# Patient Record
Sex: Female | Born: 1935 | Race: White | Hispanic: No | Smoking: Never smoker
Health system: Southern US, Community
[De-identification: ages and names within clinical notes are randomized; demographics above are authoritative.]

## PROBLEM LIST (undated history)

## (undated) DIAGNOSIS — B172 Acute hepatitis E: Secondary | ICD-10-CM

## (undated) DIAGNOSIS — M1990 Unspecified osteoarthritis, unspecified site: Secondary | ICD-10-CM

## (undated) DIAGNOSIS — M8580 Other specified disorders of bone density and structure, unspecified site: Secondary | ICD-10-CM

## (undated) DIAGNOSIS — E559 Vitamin D deficiency, unspecified: Secondary | ICD-10-CM

## (undated) DIAGNOSIS — G43909 Migraine, unspecified, not intractable, without status migrainosus: Secondary | ICD-10-CM

## (undated) DIAGNOSIS — K759 Inflammatory liver disease, unspecified: Secondary | ICD-10-CM

## (undated) DIAGNOSIS — F0390 Unspecified dementia without behavioral disturbance: Secondary | ICD-10-CM

## (undated) HISTORY — DX: Inflammatory liver disease, unspecified: K75.9

## (undated) HISTORY — DX: Migraine, unspecified, not intractable, without status migrainosus: G43.909

## (undated) HISTORY — DX: Unspecified osteoarthritis, unspecified site: M19.90

## (undated) HISTORY — DX: Acute hepatitis E: B17.2

---

## 1963-04-01 DIAGNOSIS — B172 Acute hepatitis E: Secondary | ICD-10-CM

## 1963-04-01 DIAGNOSIS — B199 Unspecified viral hepatitis without hepatic coma: Secondary | ICD-10-CM

## 1963-04-01 HISTORY — DX: Acute hepatitis E: B17.2

## 1963-04-01 HISTORY — DX: Unspecified viral hepatitis without hepatic coma: B19.9

## 1984-03-31 HISTORY — PX: ABDOMINAL HYSTERECTOMY: SHX81

## 2001-03-31 HISTORY — PX: KNEE ARTHROSCOPY: SUR90

## 2005-09-04 ENCOUNTER — Ambulatory Visit: Payer: Self-pay | Admitting: Unknown Physician Specialty

## 2005-10-16 ENCOUNTER — Ambulatory Visit: Payer: Self-pay | Admitting: Unknown Physician Specialty

## 2005-10-23 ENCOUNTER — Ambulatory Visit: Payer: Self-pay | Admitting: Unknown Physician Specialty

## 2011-07-01 DIAGNOSIS — M79609 Pain in unspecified limb: Secondary | ICD-10-CM | POA: Diagnosis not present

## 2011-07-01 DIAGNOSIS — B351 Tinea unguium: Secondary | ICD-10-CM | POA: Diagnosis not present

## 2011-09-03 DIAGNOSIS — M1710 Unilateral primary osteoarthritis, unspecified knee: Secondary | ICD-10-CM | POA: Diagnosis not present

## 2011-12-15 DIAGNOSIS — B351 Tinea unguium: Secondary | ICD-10-CM | POA: Diagnosis not present

## 2011-12-15 DIAGNOSIS — M79609 Pain in unspecified limb: Secondary | ICD-10-CM | POA: Diagnosis not present

## 2012-05-04 ENCOUNTER — Ambulatory Visit (INDEPENDENT_AMBULATORY_CARE_PROVIDER_SITE_OTHER): Payer: Medicare Other | Admitting: Internal Medicine

## 2012-05-04 ENCOUNTER — Encounter: Payer: Self-pay | Admitting: Internal Medicine

## 2012-05-04 VITALS — BP 140/82 | HR 54 | Temp 97.9°F | Resp 16 | Ht 60.0 in | Wt 136.0 lb

## 2012-05-04 DIAGNOSIS — E559 Vitamin D deficiency, unspecified: Secondary | ICD-10-CM | POA: Diagnosis not present

## 2012-05-04 DIAGNOSIS — F32A Depression, unspecified: Secondary | ICD-10-CM | POA: Insufficient documentation

## 2012-05-04 DIAGNOSIS — E538 Deficiency of other specified B group vitamins: Secondary | ICD-10-CM | POA: Diagnosis not present

## 2012-05-04 DIAGNOSIS — Z13220 Encounter for screening for lipoid disorders: Secondary | ICD-10-CM

## 2012-05-04 DIAGNOSIS — R5383 Other fatigue: Secondary | ICD-10-CM

## 2012-05-04 DIAGNOSIS — E785 Hyperlipidemia, unspecified: Secondary | ICD-10-CM | POA: Diagnosis not present

## 2012-05-04 DIAGNOSIS — R5381 Other malaise: Secondary | ICD-10-CM | POA: Diagnosis not present

## 2012-05-04 DIAGNOSIS — B172 Acute hepatitis E: Secondary | ICD-10-CM | POA: Insufficient documentation

## 2012-05-04 DIAGNOSIS — F329 Major depressive disorder, single episode, unspecified: Secondary | ICD-10-CM

## 2012-05-04 LAB — COMPREHENSIVE METABOLIC PANEL
ALT: 18 U/L (ref 0–35)
AST: 27 U/L (ref 0–37)
Chloride: 107 mEq/L (ref 96–112)
Creatinine, Ser: 0.9 mg/dL (ref 0.4–1.2)
Sodium: 142 mEq/L (ref 135–145)
Total Bilirubin: 0.8 mg/dL (ref 0.3–1.2)
Total Protein: 7 g/dL (ref 6.0–8.3)

## 2012-05-04 LAB — LIPID PANEL
Cholesterol: 176 mg/dL (ref 0–200)
LDL Cholesterol: 101 mg/dL — ABNORMAL HIGH (ref 0–99)
Triglycerides: 95 mg/dL (ref 0.0–149.0)
VLDL: 19 mg/dL (ref 0.0–40.0)

## 2012-05-04 LAB — CBC WITH DIFFERENTIAL/PLATELET
Basophils Absolute: 0.1 10*3/uL (ref 0.0–0.1)
Eosinophils Absolute: 0 10*3/uL (ref 0.0–0.7)
HCT: 40.3 % (ref 36.0–46.0)
Lymphs Abs: 1.6 10*3/uL (ref 0.7–4.0)
MCHC: 33.8 g/dL (ref 30.0–36.0)
MCV: 93.5 fl (ref 78.0–100.0)
Monocytes Absolute: 0.5 10*3/uL (ref 0.1–1.0)
Platelets: 187 10*3/uL (ref 150.0–400.0)
RDW: 14.1 % (ref 11.5–14.6)

## 2012-05-04 NOTE — Patient Instructions (Addendum)
We are checking some bloodwork today to make sure your thyroid and kidneys are functioning well   I would like to treat you for depression once I see your lab results.  I recommend trying a protein shake in the morning instead of skipping breakfast.:  Boost, Ensure,  Glucerna (less sugar).  Or [employer] Drink (try them all)

## 2012-05-04 NOTE — Assessment & Plan Note (Signed)
With a largely positive screen and increased forgetfulness. Screenings labs to be done, followed by trial of remeron ,

## 2012-05-04 NOTE — Progress Notes (Signed)
Patient ID: Catherine Meyers, female   DOB: [birth year], 79 y.o.   MRN: [id number] Patient Active Problem List  Diagnosis  . Hepatitis, water-borne  . Depression    Subjective:  CC:   Chief Complaint  Patient presents with  . Establish Care    HPI:   Catherine Meyers is a 79 y.o. female who presents as a new patient to establish primary care .  He is brought in by son and dtr in law Cindy Prashad.  She has had no medical care in 10 yrs.  FAmily has become concerned about her poor appetite, lack of follow up., doesn't cook much family sends over food often .  Skips breakfast and lunch frequently.  Sometimes cooks eggs.   Possibly a 40 lb wt loss.  Cared for dying husband for a year,  5 yrs ago he died.  Much of the weight came off during that period. Depression screen is positive.     Past Medical History  Diagnosis Date  . Arthritis   . Migraine   . Hepatitis   . Hepatitis, water-borne 1965    Past Surgical History  Procedure Date  . Abdominal hysterectomy 1986  . Knee arthroscopy 2003    Family History  Problem Relation Age of Onset  . Cancer Sister     breast    History   Social History  . Marital Status: Widowed    Spouse Name: N/A    Number of Children: N/A  . Years of Education: N/A   Occupational History  . Not on file.   Social History Main Topics  . Smoking status: Never Smoker   . Smokeless tobacco: Not on file  . Alcohol Use: No  . Drug Use: No  . Sexually Active: No   Other Topics Concern  . Not on file   Social History Narrative  . No narrative on file    No Known Allergies   Review of Systems:   Patient denies headache, fevers, malaise, unintentional weight loss, skin rash, eye pain, sinus congestion and sinus pain, sore throat, dysphagia,  hemoptysis , cough, dyspnea, wheezing, chest pain, palpitations, orthopnea, edema, abdominal pain, nausea, melena, diarrhea, constipation, flank pain, dysuria, hematuria, urinary  Frequency, nocturia, numbness,  tingling, seizures,  Focal weakness, Loss of consciousness,  Tremor, insomnia, depression, anxiety, and suicidal ideation.     Objective:  BP 140/82  Pulse 54  Temp 97.9 F (36.6 C) (Oral)  Resp 16  Ht 5' (1.524 m)  Wt 136 lb (61.689 kg)  BMI 26.56 kg/m2  SpO2 96%  General appearance: alert, cooperative and appears stated age Ears: normal TM's and external ear canals both ears Throat: lips, mucosa, and tongue normal; teeth and gums normal Neck: no adenopathy, no carotid bruit, supple, symmetrical, trachea midline and thyroid not enlarged, symmetric, no tenderness/mass/nodules Back: symmetric, no curvature. ROM normal. No CVA tenderness. Lungs: clear to auscultation bilaterally Heart: regular rate and rhythm, S1, S2 normal, no murmur, click, rub or gallop Abdomen: soft, non-tender; bowel sounds normal; no masses,  no organomegaly Pulses: 2+ and symmetric Skin: Skin color, texture, turgor normal. No rashes or lesions Lymph nodes: Cervical, supraclavicular, and axillary nodes normal.  Assessment and Plan:  Depression With a largely positive screen and increased forgetfulness. Screenings labs to be done, followed by trial of remeron ,    Updated Medication List No outpatient encounter prescriptions on file as of 05/04/2012.     Orders Placed This Encounter  Procedures  . Comprehensive metabolic panel  .  TSH  . Vitamin D 25 hydroxy  . Lipid panel  . CBC with Differential  . Vitamin B12  . Folate RBC  . CBC with Differential    Return in about 3 weeks (around 05/25/2012).

## 2012-05-05 DIAGNOSIS — E559 Vitamin D deficiency, unspecified: Secondary | ICD-10-CM | POA: Insufficient documentation

## 2012-05-05 MED ORDER — ERGOCALCIFEROL 1.25 MG (50000 UT) PO CAPS: 50000.0000 [IU] | ORAL_CAPSULE | ORAL | Status: DC

## 2012-05-05 MED ORDER — MIRTAZAPINE 7.5 MG PO TABS: 7.5000 mg | ORAL_TABLET | Freq: Every day | ORAL | Status: DC

## 2012-05-05 NOTE — Addendum Note (Signed)
Addended by: Sherlene Shams on: 05/05/2012 01:13 PM   Modules accepted: Orders

## 2012-05-15 ENCOUNTER — Other Ambulatory Visit: Payer: Self-pay

## 2012-05-28 ENCOUNTER — Encounter: Payer: Self-pay | Admitting: Internal Medicine

## 2012-05-28 ENCOUNTER — Ambulatory Visit (INDEPENDENT_AMBULATORY_CARE_PROVIDER_SITE_OTHER): Payer: Medicare Other | Admitting: Internal Medicine

## 2012-05-28 VITALS — BP 140/78 | HR 56 | Temp 97.6°F | Resp 16 | Wt 139.2 lb

## 2012-05-28 DIAGNOSIS — F0390 Unspecified dementia without behavioral disturbance: Secondary | ICD-10-CM | POA: Diagnosis not present

## 2012-05-28 DIAGNOSIS — F068 Other specified mental disorders due to known physiological condition: Secondary | ICD-10-CM | POA: Diagnosis not present

## 2012-05-28 MED ORDER — ALPRAZOLAM 0.25 MG PO TABS: 0.2500 mg | ORAL_TABLET | Freq: Every evening | ORAL | Status: DC | PRN

## 2012-05-28 NOTE — Progress Notes (Signed)
Patient ID: Catherine Meyers, female   DOB: [birth year], 79 y.o.   MRN: [id number]   Patient Active Problem List  Diagnosis  . Hepatitis, water-borne  . Depression  . Vitamin D deficiency  . Dementia arising in the senium and presenium    Subjective:  CC:   Chief Complaint  Patient presents with  . Follow-up    HPI:   Catherine Meyers a 79 y.o. female who presents for one month follow up on dementia diagnosed last month at her first visit. She was noted to have a low vitamin D but otherwise normal labs with regard to thyroid,  Cholesterol,  renal and liver function. Her son believes that her cognitive deficits or increasing.  There have been no behavioral disturbances. She was prescribed Remeron for depression but has not been taking the Remeron on a regular basis because she does not remember to and he has been unable to call her a regular basis to remind her. She has lost 2 more kilograms that she forgets to eat.   Past Medical History  Diagnosis Date  . Arthritis   . Migraine   . Hepatitis   . Hepatitis, water-borne 1965    Past Surgical History  Procedure Laterality Date  . Abdominal hysterectomy  1986  . Knee arthroscopy  2003       The following portions of the patient's history were reviewed and updated as appropriate: Allergies, current medications, and problem list.    Review of Systems:   Patient denies headache, fevers, malaise, unintentional weight loss, skin rash, eye pain, sinus congestion and sinus pain, sore throat, dysphagia,  hemoptysis , cough, dyspnea, wheezing, chest pain, palpitations, orthopnea, edema, abdominal pain, nausea, melena, diarrhea, constipation, flank pain, dysuria, hematuria, urinary  Frequency, nocturia, numbness, tingling, seizures,  Focal weakness, Loss of consciousness,  Tremor, insomnia, depression, anxiety, and suicidal ideation.     History   Social History  . Marital Status: Widowed    Spouse Name: N/A    Number of Children: N/A   . Years of Education: N/A   Occupational History  . Not on file.   Social History Main Topics  . Smoking status: Never Smoker   . Smokeless tobacco: Not on file  . Alcohol Use: No  . Drug Use: No  . Sexually Active: No   Other Topics Concern  . Not on file   Social History Narrative  . No narrative on file    Objective:  BP 140/78  Pulse 56  Temp(Src) 97.6 F (36.4 C) (Oral)  Resp 16  Wt 139 lb 4 oz (63.163 kg)  BMI 27.2 kg/m2  SpO2 97%  General appearance: alert, cooperative and appears stated age Ears: normal TM's and external ear canals both ears Throat: lips, mucosa, and tongue normal; teeth and gums normal Neck: no adenopathy, no carotid bruit, supple, symmetrical, trachea midline and thyroid not enlarged, symmetric, no tenderness/mass/nodules Back: symmetric, no curvature. ROM normal. No CVA tenderness. Lungs: clear to auscultation bilaterally Heart: regular rate and rhythm, S1, S2 normal, no murmur, click, rub or gallop Abdomen: soft, non-tender; bowel sounds normal; no masses,  no organomegaly Pulses: 2+ and symmetric Skin: Skin color, texture, turgor normal. No rashes or lesions Lymph nodes: Cervical, supraclavicular, and axillary nodes normal.  Assessment and Plan:  Dementia arising in the senium and presenium She was screened for hypothyroidism and B12 deficiencies. She does have concurrent depression due to the loss of her husband several years ago. Remeron has been started but not  taken consistently. Will refer her for MRI of the brain and start Aricept if MRI the brain suggests that the etiology of her dementia is Alzheimer's.   Updated Medication List Outpatient Encounter Prescriptions as of 05/28/2012  Medication Sig Dispense Refill  . ergocalciferol (DRISDOL) 50000 UNITS capsule Take 1 capsule (50,000 Units total) by mouth once a week.  12 capsule  1  . MELOXICAM PO Take 1 tablet by mouth as needed (pain).      Marland Kitchen ALPRAZolam (XANAX) 0.25 MG tablet  Take 1 tablet (0.25 mg total) by mouth at bedtime as needed for sleep or anxiety.  10 tablet  0  . mirtazapine (REMERON) 7.5 MG tablet Take 1 tablet (7.5 mg total) by mouth at bedtime.  30 tablet  1   No facility-administered encounter medications on file as of 05/28/2012.     Orders Placed This Encounter  Procedures  . MR Brain Wo Contrast    Return in about 4 weeks (around 06/25/2012).

## 2012-05-28 NOTE — Patient Instructions (Addendum)
I am recommending the MRI of your brain to make sure you haven't had an silent strokes that have affected your memory  I recommend trying the medication for nerves once before the MRI to see how it makes you feel.  If you tolerate it,  Take one before you leave home for the MRI and taek one with you in case you feel very nervous in the machine  I recommend that you wear  A Life Alert necklace every day to keep you safe

## 2012-05-30 DIAGNOSIS — F329 Major depressive disorder, single episode, unspecified: Secondary | ICD-10-CM | POA: Insufficient documentation

## 2012-05-30 NOTE — Assessment & Plan Note (Signed)
She was screened for hypothyroidism and B12 deficiencies. She does have concurrent depression due to the loss of her husband several years ago. Remeron has been started but not taken consistently. Will refer her for MRI of the brain and start Aricept if MRI the brain suggests that the etiology of her dementia is Alzheimer's.

## 2012-06-11 ENCOUNTER — Telehealth: Payer: Self-pay | Admitting: Internal Medicine

## 2012-06-11 DIAGNOSIS — Z87898 Personal history of other specified conditions: Secondary | ICD-10-CM

## 2012-06-11 NOTE — Telephone Encounter (Signed)
Catherine Meyers was called and she advised that her husband was just going to take pt into the ED to be evaluated.

## 2012-06-11 NOTE — Telephone Encounter (Signed)
Patient Information:  Caller Name: Daughter in Guelda Batson  Phone: [phone]  Patient: Catherine Meyers, Catherine Meyers  Gender: Female  DOB: [birth year]  Age: 79 Years  PCP: Duncan Dull (Adults only)  Office Follow Up:  Does the office need to follow up with this patient?: No  Instructions For The Office: N/A  RN Note:  They are currently not with patient. Advised to call us once they arrive to home and check patient. Reviewed Emergent s/sx and advised ED/911 if needed. Understanding expressed.  Symptoms  Reason For Call & Symptoms: Daughter in law is caller. She states her patient has been vomiting . onset yesterday 06/10/12 at noon.  The patient lives by herself and they are not with her. Her son is on the way to her house now.  Today, they spoke with her and she is confused . She is talking about deceased relatives.  Reviewed Health History In EMR: N/A  Reviewed Medications In EMR: N/A  Reviewed Allergies In EMR: N/A  Reviewed Surgeries / Procedures: N/A  Date of Onset of Symptoms: 06/10/2012  Guideline(s) Used:  Confusion - Delirium  Disposition Per Guideline:   Go to ED Now  Reason For Disposition Reached:   Headache or vomiting  Advice Given:  Call Back If:  You (i.e., patient, family member) become worse.  Patient Will Follow Care Advice:  YES

## 2012-06-19 ENCOUNTER — Ambulatory Visit: Payer: Self-pay | Admitting: Internal Medicine

## 2012-06-19 DIAGNOSIS — I6789 Other cerebrovascular disease: Secondary | ICD-10-CM | POA: Diagnosis not present

## 2012-06-19 DIAGNOSIS — G319 Degenerative disease of nervous system, unspecified: Secondary | ICD-10-CM | POA: Diagnosis not present

## 2012-06-19 DIAGNOSIS — F0280 Dementia in other diseases classified elsewhere without behavioral disturbance: Secondary | ICD-10-CM | POA: Diagnosis not present

## 2012-06-19 DIAGNOSIS — F0390 Unspecified dementia without behavioral disturbance: Secondary | ICD-10-CM | POA: Diagnosis not present

## 2012-07-06 ENCOUNTER — Encounter: Payer: Self-pay | Admitting: Internal Medicine

## 2012-07-13 ENCOUNTER — Encounter: Payer: Self-pay | Admitting: Internal Medicine

## 2012-11-03 ENCOUNTER — Other Ambulatory Visit: Payer: Self-pay

## 2012-11-26 DIAGNOSIS — B351 Tinea unguium: Secondary | ICD-10-CM | POA: Diagnosis not present

## 2012-11-26 DIAGNOSIS — M79609 Pain in unspecified limb: Secondary | ICD-10-CM | POA: Diagnosis not present

## 2013-02-03 ENCOUNTER — Other Ambulatory Visit: Payer: Self-pay

## 2013-04-15 ENCOUNTER — Emergency Department: Payer: Self-pay | Admitting: Emergency Medicine

## 2013-04-15 DIAGNOSIS — R059 Cough, unspecified: Secondary | ICD-10-CM | POA: Diagnosis not present

## 2013-04-15 DIAGNOSIS — E869 Volume depletion, unspecified: Secondary | ICD-10-CM | POA: Diagnosis not present

## 2013-04-15 DIAGNOSIS — R5383 Other fatigue: Secondary | ICD-10-CM | POA: Diagnosis not present

## 2013-04-15 DIAGNOSIS — R05 Cough: Secondary | ICD-10-CM | POA: Diagnosis not present

## 2013-04-15 DIAGNOSIS — R5381 Other malaise: Secondary | ICD-10-CM | POA: Diagnosis not present

## 2013-04-15 DIAGNOSIS — B9789 Other viral agents as the cause of diseases classified elsewhere: Secondary | ICD-10-CM | POA: Diagnosis not present

## 2013-04-15 DIAGNOSIS — Z79899 Other long term (current) drug therapy: Secondary | ICD-10-CM | POA: Diagnosis not present

## 2013-04-15 DIAGNOSIS — J111 Influenza due to unidentified influenza virus with other respiratory manifestations: Secondary | ICD-10-CM | POA: Diagnosis not present

## 2013-04-15 LAB — CBC
HCT: 42 % (ref 35.0–47.0)
HGB: 14.5 g/dL (ref 12.0–16.0)
MCH: 31.3 pg (ref 26.0–34.0)
MCHC: 34.4 g/dL (ref 32.0–36.0)
MCV: 91 fL (ref 80–100)
Platelet: 169 10*3/uL (ref 150–440)
RBC: 4.62 10*6/uL (ref 3.80–5.20)
RDW: 13.4 % (ref 11.5–14.5)
WBC: 6.5 10*3/uL (ref 3.6–11.0)

## 2013-04-15 LAB — COMPREHENSIVE METABOLIC PANEL
ALBUMIN: 3.6 g/dL (ref 3.4–5.0)
ALK PHOS: 76 U/L
ALT: 20 U/L (ref 12–78)
ANION GAP: 4 — AB (ref 7–16)
BUN: 17 mg/dL (ref 7–18)
Bilirubin,Total: 0.3 mg/dL (ref 0.2–1.0)
CHLORIDE: 102 mmol/L (ref 98–107)
Calcium, Total: 8.6 mg/dL (ref 8.5–10.1)
Co2: 30 mmol/L (ref 21–32)
Creatinine: 1.01 mg/dL (ref 0.60–1.30)
EGFR (Non-African Amer.): 51 — ABNORMAL LOW
GFR CALC AF AMER: 60 — AB
Glucose: 90 mg/dL (ref 65–99)
Osmolality: 273 (ref 275–301)
POTASSIUM: 3.6 mmol/L (ref 3.5–5.1)
SGOT(AST): 24 U/L (ref 15–37)
SODIUM: 136 mmol/L (ref 136–145)
TOTAL PROTEIN: 6.9 g/dL (ref 6.4–8.2)

## 2013-04-15 LAB — URINALYSIS, COMPLETE
BILIRUBIN, UR: NEGATIVE
Bacteria: NONE SEEN
Blood: NEGATIVE
Glucose,UR: NEGATIVE mg/dL (ref 0–75)
KETONE: NEGATIVE
Leukocyte Esterase: NEGATIVE
NITRITE: NEGATIVE
PH: 5 (ref 4.5–8.0)
RBC,UR: 1 /HPF (ref 0–5)
SPECIFIC GRAVITY: 1.027 (ref 1.003–1.030)
Squamous Epithelial: NONE SEEN
WBC UR: <1 /HPF (ref 0–5)

## 2013-04-15 LAB — TROPONIN I

## 2013-04-15 LAB — CK TOTAL AND CKMB (NOT AT ARMC)
CK, TOTAL: 63 U/L (ref 21–215)
CK-MB: 0.7 ng/mL (ref 0.5–3.6)

## 2013-04-22 ENCOUNTER — Ambulatory Visit (INDEPENDENT_AMBULATORY_CARE_PROVIDER_SITE_OTHER): Payer: Medicare Other | Admitting: Internal Medicine

## 2013-04-22 ENCOUNTER — Encounter: Payer: Self-pay | Admitting: Internal Medicine

## 2013-04-22 VITALS — BP 118/68 | HR 87 | Temp 98.2°F | Wt 132.0 lb

## 2013-04-22 DIAGNOSIS — J209 Acute bronchitis, unspecified: Secondary | ICD-10-CM | POA: Diagnosis not present

## 2013-04-22 MED ORDER — AZITHROMYCIN 250 MG PO TABS: ORAL_TABLET | ORAL | Status: DC

## 2013-04-22 MED ORDER — DEXTROMETHORPHAN-GUAIFENESIN 20-200 MG/5ML PO LIQD: 5.0000 mL | Freq: Three times a day (TID) | ORAL | Status: DC | PRN

## 2013-04-22 NOTE — Progress Notes (Signed)
Pre-visit discussion using our clinic review tool. No additional management support is needed unless otherwise documented below in the visit note.  

## 2013-04-22 NOTE — Progress Notes (Signed)
HPI  Pt presents to the clinic today with c/o cough and chest congestion. This started about 2 weeks ago. [facility]. Her chest xray was negative for pneumonia. They gave her Tessalon Pearls. Her cough has not improved. She denies fever chills or body aches.  Review of Systems      Past Medical History  Diagnosis Date  . Arthritis   . Migraine   . Hepatitis   . Hepatitis, water-borne 1965    Family History  Problem Relation Age of Onset  . Cancer Sister     breast    History   Social History  . Marital Status: Widowed    Spouse Name: N/A    Number of Children: N/A  . Years of Education: N/A   Occupational History  . Not on file.   Social History Main Topics  . Smoking status: Never Smoker   . Smokeless tobacco: Not on file  . Alcohol Use: No  . Drug Use: No  . Sexual Activity: No   Other Topics Concern  . Not on file   Social History Narrative  . No narrative on file    No Known Allergies   Constitutional:  Denies headache, fatigue, fever or  abrupt weight changes.  HEENT:  Denies eye redness, eye pain, pressure behind the eyes, facial pain, nasal congestion, ear pain, ringing in the ears, wax buildup, runny nose or bloody nose. Respiratory: Positive cough and chest congestion. Denies difficulty breathing or shortness of breath.  Cardiovascular: Denies chest pain, chest tightness, palpitations or swelling in the hands or feet.   No other specific complaints in a complete review of systems (except as listed in HPI above).  Objective:   BP 118/68  Pulse 87  Temp(Src) 98.2 F (36.8 C) (Oral)  Wt 132 lb (59.875 kg)  SpO2 96% Wt Readings from Last 3 Encounters:  04/22/13 132 lb (59.875 kg)  05/28/12 139 lb 4 oz (63.163 kg)  05/04/12 136 lb (61.689 kg)     General: Appears her stated age, chronically ill appearing in NAD. HEENT: Head: normal shape and size; Eyes: sclera white, no icterus, conjunctiva pink, PERRLA and EOMs  intact; Ears: Tm's gray and intact, normal light reflex; Nose: mucosa pink and moist, septum midline; Throat/Mouth. Teeth present, mucosa erythematous and moist, no exudate noted, no lesions or ulcerations noted.  Neck: Mild cervical lymphadenopathy. Neck supple, trachea midline. No massses, lumps or thyromegaly present.  Cardiovascular: Normal rate and rhythm. S1,S2 noted.  No murmur, rubs or gallops noted. No JVD or BLE edema. No carotid bruits noted. Pulmonary/Chest: Normal effort and positive vesicular breath sounds. No respiratory distress. No wheezes, rales or ronchi noted.      Assessment & Plan:   Acute Bronchitis:  Get some rest and drink plenty of water eRx for Azithromax x 5 days eRx for dextromethorphan with guaifenesin cough syrup  RTC as needed or if symptoms persist.

## 2013-04-22 NOTE — Patient Instructions (Signed)

## 2013-05-10 DIAGNOSIS — M79609 Pain in unspecified limb: Secondary | ICD-10-CM | POA: Diagnosis not present

## 2013-05-10 DIAGNOSIS — B351 Tinea unguium: Secondary | ICD-10-CM | POA: Diagnosis not present

## 2013-05-16 ENCOUNTER — Encounter: Payer: Medicare Other | Admitting: Internal Medicine

## 2013-05-31 ENCOUNTER — Ambulatory Visit (INDEPENDENT_AMBULATORY_CARE_PROVIDER_SITE_OTHER): Payer: Medicare Other | Admitting: Internal Medicine

## 2013-05-31 ENCOUNTER — Encounter: Payer: Self-pay | Admitting: Internal Medicine

## 2013-05-31 VITALS — BP 132/66 | HR 68 | Temp 97.4°F | Resp 16 | Ht 59.5 in | Wt 130.5 lb

## 2013-05-31 DIAGNOSIS — R5381 Other malaise: Secondary | ICD-10-CM

## 2013-05-31 DIAGNOSIS — F32A Depression, unspecified: Secondary | ICD-10-CM

## 2013-05-31 DIAGNOSIS — F3289 Other specified depressive episodes: Secondary | ICD-10-CM | POA: Diagnosis not present

## 2013-05-31 DIAGNOSIS — Z0000 Encounter for general adult medical examination without abnormal findings: Secondary | ICD-10-CM | POA: Diagnosis not present

## 2013-05-31 DIAGNOSIS — K5900 Constipation, unspecified: Secondary | ICD-10-CM

## 2013-05-31 DIAGNOSIS — F068 Other specified mental disorders due to known physiological condition: Secondary | ICD-10-CM

## 2013-05-31 DIAGNOSIS — R5383 Other fatigue: Secondary | ICD-10-CM | POA: Diagnosis not present

## 2013-05-31 DIAGNOSIS — F329 Major depressive disorder, single episode, unspecified: Secondary | ICD-10-CM | POA: Diagnosis not present

## 2013-05-31 DIAGNOSIS — E559 Vitamin D deficiency, unspecified: Secondary | ICD-10-CM

## 2013-05-31 DIAGNOSIS — F0390 Unspecified dementia without behavioral disturbance: Secondary | ICD-10-CM

## 2013-05-31 MED ORDER — POLYETHYLENE GLYCOL 3350 17 GM/SCOOP PO POWD: 17.0000 g | Freq: Every day | ORAL | Status: DC

## 2013-05-31 NOTE — Progress Notes (Addendum)
Patient ID: Catherine Meyers, female   DOB: 1927/04/29, 80 y.o.   MRN: [id number]  The patient is here for annual Medicare wellness examination and management of other chronic and acute problems. nnual follow up.  She is accompanied by son Kathlene NovemberMike and Alona BeneJoyce her part time caregiver who is with her for 4 hours  4 days/week in the afternoons.  Caregiver has experience with dementia. Notes good appetite,  No agitation.  Patient stays in robe a lot.  Will not let caregiver help her bathe or shower.   has walk  in shower with a bench and a rail.   Found by word of mouth at church.  Having hard BMs twice a week.  Occasionally has a loose stool per son.  Does not like to take medication.      The risk factors are reflected in the social history.  The roster of all physicians providing medical care to patient - is listed in the Snapshot section of the chart.  Activities of daily living:  The patient is not 100% independent in all ADLs due to dementia : dressing, toileting, feeding as well as independent mobility  Home safety : The patient has smoke detectors in the home. They wear seatbelts.  There are no firearms at home. There is no violence in the home.   There is no risks for hepatitis, STDs or HIV. There is no   history of blood transfusion. They have no travel history to infectious disease endemic areas of the world.  The patient has seen their dentist in the last six month. They have seen their eye doctor in the last year. They admit to slight hearing difficulty with regard to whispered voices and some television programs.  They have deferred audiologic testing in the last year.  They do not  have excessive sun exposure. Discussed the need for sun protection: hats, long sleeves and use of sunscreen if there is significant sun exposure.   Diet: the importance of a healthy diet is discussed. They do have a healthy diet.  The benefits of regular aerobic exercise were discussed. She does not exercise and stays  indoors all of the tie .   Depression screen: there are no signs or vegative symptoms of depression- irritability, change in appetite, anhedonia, sadness/tearfullness, but patient does suffer from dementia   Cognitive assessment: the patient does not manage all their financial and personal affairs and is not actively engaged. They could not relate day,date,year and events.   The following portions of the patient's history were reviewed and updated as appropriate: allergies, current medications, past family history, past medical history,  past surgical history, past social history  and problem list.  Visual acuity was not assessed per patient preference since she has regular follow up with her ophthalmologist. Hearing and body mass index were assessed and reviewed.   During the course of the visit the patient was educated and counseled about appropriate screening and preventive services including : fall prevention , diabetes screening, nutrition counseling, colorectal cancer screening, and recommended immunizations.    Objective:  General appearance: alert, cooperative and appears stated age Ears: normal TM's and external ear canals both ears Throat: lips, mucosa, and tongue normal; teeth and gums normal Neck: no adenopathy, no carotid bruit, supple, symmetrical, trachea midline and thyroid not enlarged, symmetric, no tenderness/mass/nodules Back: symmetric, no curvature. ROM normal. No CVA tenderness. Lungs: clear to auscultation bilaterally Heart: regular rate and rhythm, S1, S2 normal, no murmur, click, rub or gallop Abdomen:  soft, non-tender; bowel sounds normal; no masses,  no organomegaly Pulses: 2+ and symmetric Skin: Skin color, texture, turgor normal. No rashes or lesions Sacrum:  No skin breakdown or inguinal rash. Lymph nodes: Cervical, supraclavicular, and axillary nodes normal.  Assessment and Plan:  Vitamin D deficiency Repeat level 27.  resume Drisdol x 3 month  s  Encounter for preventive health examination Discussed the utility of screening for CA with son.  Given patient's dementia and age, no continued screening for breast or colon CA is planned.   Depression Improved with remeron. No changes today  Vascular dementia with depressed mood Metabolic screening and MRI done last year confirm.  Depression has improved with remeron. She will not take aricept.   Unspecified constipation Daily miralax recommended.    Updated Medication List Outpatient Encounter Prescriptions as of 05/31/2013  Medication Sig  . meloxicam (MOBIC) 15 MG tablet Take 15 mg by mouth daily.  Marland Kitchen Dextromethorphan-Guaifenesin 20-200 MG/5ML LIQD Take 5 mLs by mouth every 8 (eight) hours as needed.  . ergocalciferol (DRISDOL) 50000 UNITS capsule Take 1 capsule (50,000 Units total) by mouth once a week.  . polyethylene glycol powder (GLYCOLAX/MIRALAX) powder Take 17 g by mouth daily.  . [DISCONTINUED] azithromycin (ZITHROMAX) 250 MG tablet Take 2 tabs today, then 1 tab daily x 4 days

## 2013-05-31 NOTE — Progress Notes (Signed)
Pre-visit discussion using our clinic review tool. No additional management support is needed unless otherwise documented below in the visit note.  

## 2013-05-31 NOTE — Patient Instructions (Signed)
Dr. [name] say you have to wear a " Life Alert" necklace ALL THE TIME if you want to continue living at home  YOU HAVE TO HAVE A [facility] EVERY Monday Wednesday AND Friday.  Catherine Meyers will help you   YOU NEED TO USE A WALKER TO PREVENT FALLS  You need to have a dose of miralax once day to help your constipation

## 2013-06-01 LAB — COMPREHENSIVE METABOLIC PANEL
ALBUMIN: 3.9 g/dL (ref 3.5–5.2)
ALK PHOS: 74 U/L (ref 39–117)
ALT: 17 U/L (ref 0–35)
AST: 24 U/L (ref 0–37)
BUN: 17 mg/dL (ref 6–23)
CALCIUM: 9.3 mg/dL (ref 8.4–10.5)
CHLORIDE: 105 meq/L (ref 96–112)
CO2: 24 mEq/L (ref 19–32)
CREATININE: 1 mg/dL (ref 0.4–1.2)
GFR: 56.88 mL/min — ABNORMAL LOW (ref >60.00)
Glucose, Bld: 64 mg/dL — ABNORMAL LOW (ref 70–99)
POTASSIUM: 4.7 meq/L (ref 3.5–5.1)
Sodium: 142 mEq/L (ref 135–145)
Total Bilirubin: 0.5 mg/dL (ref 0.3–1.2)
Total Protein: 6.8 g/dL (ref 6.0–8.3)

## 2013-06-01 LAB — VITAMIN D 25 HYDROXY (VIT D DEFICIENCY, FRACTURES): Vit D, 25-Hydroxy: 27 ng/mL — ABNORMAL LOW (ref 30–89)

## 2013-06-01 LAB — CBC WITH DIFFERENTIAL/PLATELET
BASOS ABS: 0 10*3/uL (ref 0.0–0.1)
Basophils Relative: 0.5 % (ref 0.0–3.0)
Eosinophils Absolute: 0.1 10*3/uL (ref 0.0–0.7)
Eosinophils Relative: 1.5 % (ref 0.0–5.0)
HEMATOCRIT: 41.1 % (ref 36.0–46.0)
Hemoglobin: 13.7 g/dL (ref 12.0–15.0)
LYMPHS ABS: 1.9 10*3/uL (ref 0.7–4.0)
Lymphocytes Relative: 27.3 % (ref 12.0–46.0)
MCHC: 33.4 g/dL (ref 30.0–36.0)
MCV: 95 fl (ref 78.0–100.0)
MONO ABS: 0.7 10*3/uL (ref 0.1–1.0)
Monocytes Relative: 9.7 % (ref 3.0–12.0)
Neutro Abs: 4.2 10*3/uL (ref 1.4–7.7)
Neutrophils Relative %: 61 % (ref 43.0–77.0)
PLATELETS: 261 10*3/uL (ref 150.0–400.0)
RBC: 4.32 Mil/uL (ref 3.87–5.11)
RDW: 14.8 % — ABNORMAL HIGH (ref 11.5–14.6)
WBC: 6.9 10*3/uL (ref 4.5–10.5)

## 2013-06-01 LAB — TSH: TSH: 3.17 u[IU]/mL (ref 0.35–5.50)

## 2013-06-02 ENCOUNTER — Encounter: Payer: Self-pay | Admitting: Internal Medicine

## 2013-06-02 DIAGNOSIS — K5900 Constipation, unspecified: Secondary | ICD-10-CM | POA: Insufficient documentation

## 2013-06-02 DIAGNOSIS — Z0000 Encounter for general adult medical examination without abnormal findings: Secondary | ICD-10-CM | POA: Insufficient documentation

## 2013-06-02 MED ORDER — ERGOCALCIFEROL 1.25 MG (50000 UT) PO CAPS: 50000.0000 [IU] | ORAL_CAPSULE | ORAL | Status: DC

## 2013-06-02 NOTE — Assessment & Plan Note (Signed)
Daily miralax recommended.

## 2013-06-02 NOTE — Assessment & Plan Note (Signed)
Repeat level 27.  resume Drisdol x 3 month s

## 2013-06-02 NOTE — Assessment & Plan Note (Signed)
Improved with remeron. No changes today

## 2013-06-02 NOTE — Assessment & Plan Note (Signed)
Discussed the utility of screening for CA with son.  Given patient's dementia and age, no continued screening for breast or colon CA is planned.

## 2013-06-02 NOTE — Assessment & Plan Note (Signed)
Metabolic screening and MRI done last year confirm.  Depression has improved with remeron. She will not take aricept.

## 2013-06-06 ENCOUNTER — Other Ambulatory Visit: Payer: Self-pay | Admitting: *Deleted

## 2013-06-06 DIAGNOSIS — E559 Vitamin D deficiency, unspecified: Secondary | ICD-10-CM

## 2013-06-06 MED ORDER — ERGOCALCIFEROL 1.25 MG (50000 UT) PO CAPS: 50000.0000 [IU] | ORAL_CAPSULE | ORAL | Status: DC

## 2013-10-17 ENCOUNTER — Ambulatory Visit: Payer: Medicare Other | Admitting: Internal Medicine

## 2013-10-18 ENCOUNTER — Encounter: Payer: Self-pay | Admitting: Internal Medicine

## 2013-10-18 ENCOUNTER — Telehealth: Payer: Self-pay | Admitting: *Deleted

## 2013-10-18 ENCOUNTER — Ambulatory Visit (INDEPENDENT_AMBULATORY_CARE_PROVIDER_SITE_OTHER): Payer: Medicare Other | Admitting: Internal Medicine

## 2013-10-18 VITALS — BP 136/70 | HR 56 | Temp 97.7°F | Resp 16 | Ht 59.5 in | Wt 135.2 lb

## 2013-10-18 DIAGNOSIS — F0151 Vascular dementia with behavioral disturbance: Secondary | ICD-10-CM | POA: Diagnosis not present

## 2013-10-18 DIAGNOSIS — R300 Dysuria: Secondary | ICD-10-CM

## 2013-10-18 DIAGNOSIS — F329 Major depressive disorder, single episode, unspecified: Secondary | ICD-10-CM

## 2013-10-18 DIAGNOSIS — F0153 Vascular dementia, unspecified severity, with mood disturbance: Secondary | ICD-10-CM | POA: Diagnosis not present

## 2013-10-18 LAB — POCT URINALYSIS DIPSTICK
BILIRUBIN UA: NEGATIVE
Glucose, UA: NEGATIVE
KETONES UA: NEGATIVE
Nitrite, UA: NEGATIVE
PH UA: 6
Protein, UA: NEGATIVE
Spec Grav, UA: 1.015
Urobilinogen, UA: 0.2

## 2013-10-18 MED ORDER — CRANBERRY 400 MG PO TABS: 1.0000 | ORAL_TABLET | Freq: Two times a day (BID) | ORAL | Status: DC

## 2013-10-18 NOTE — Telephone Encounter (Signed)
Bring patient in today, I will see her

## 2013-10-18 NOTE — Progress Notes (Signed)
Pre-visit discussion using our clinic review tool. No additional management support is needed unless otherwise documented below in the visit note.  

## 2013-10-18 NOTE — Patient Instructions (Signed)
Pleasde consider giving Catherine Meyers a cranberry tablet twice daily with meals to prevent UTI  We will start an antibiotic if the urine specimen results in a positive infection result

## 2013-10-18 NOTE — Telephone Encounter (Signed)
pts daughter in law called states pt has increased confusion with strong urine odor.  Concerned pt may have a UTI.  Please advise

## 2013-10-18 NOTE — Telephone Encounter (Signed)
Spoke with pts daughter in law, pt scheduled for 7.20.15 @ 3:15

## 2013-10-19 ENCOUNTER — Encounter: Payer: Self-pay | Admitting: Internal Medicine

## 2013-10-19 DIAGNOSIS — N390 Urinary tract infection, site not specified: Secondary | ICD-10-CM | POA: Insufficient documentation

## 2013-10-19 LAB — URINALYSIS, ROUTINE W REFLEX MICROSCOPIC
Bilirubin Urine: NEGATIVE
Hgb urine dipstick: NEGATIVE
KETONES UR: NEGATIVE
Nitrite: NEGATIVE
SPECIFIC GRAVITY, URINE: 1.01 (ref 1.000–1.030)
Total Protein, Urine: NEGATIVE
Urine Glucose: NEGATIVE
Urobilinogen, UA: 0.2 (ref 0.0–1.0)
pH: 6 (ref 5.0–8.0)

## 2013-10-19 MED ORDER — SULFAMETHOXAZOLE-TRIMETHOPRIM 800-160 MG PO TABS: 1.0000 | ORAL_TABLET | Freq: Two times a day (BID) | ORAL | Status: DC

## 2013-10-19 NOTE — Assessment & Plan Note (Addendum)
With pyuria on UA.  Empiric septra DS and probiotic x 7 days

## 2013-10-19 NOTE — Progress Notes (Addendum)
Patient ID: RADIAH LUBINSKI, female   DOB: Dec 15, 1927, 80 y.o.   MRN: [id number]   Patient Active Problem List   Diagnosis Date Noted  . UTI (urinary tract infection) 10/19/2013  . Encounter for preventive health examination 06/02/2013  . Unspecified constipation 06/02/2013  . Vascular dementia with depressed mood 05/30/2012  . Vitamin D deficiency 05/05/2012  . Depression 05/04/2012    Subjective:  CC:   Chief Complaint  Patient presents with  . Polyuria    10 to15 times a day, interferring with sleep.    HPI:   SONG MYRE is a 80 y.o. female who presents for follow up on dementia and other related issues.  She has been having urinary frequency, up to 15  Times daily.  Her  caregiver is unable to quantify volume bc patient will not allow anyone to help her in bathroom.  She refuses to bathe  and refuses to allow family or caregiver to help her.  She denies pain with urination, nausea and malaise,  Appetite good.  Marland Kitchen  Has been more confused lately. No behavioral issues.    Past Medical History  Diagnosis Date  . Arthritis   . Migraine   . Hepatitis   . Hepatitis, water-borne 1965    Past Surgical History  Procedure Laterality Date  . Abdominal hysterectomy  1986  . Knee arthroscopy  2003       The following portions of the patient's history were reviewed and updated as appropriate: Allergies, current medications, and problem list.    Review of Systems:   Patient denies headache, fevers, malaise, unintentional weight loss, skin rash, eye pain, sinus congestion and sinus pain, sore throat, dysphagia,  hemoptysis , cough, dyspnea, wheezing, chest pain, palpitations, orthopnea, edema, abdominal pain, nausea, melena, diarrhea, constipation, flank pain, dysuria, hematuria, urinary  Frequency, nocturia, numbness, tingling, seizures,  Focal weakness, Loss of consciousness,  Tremor, insomnia, depression, anxiety, and suicidal ideation.     History   Social History  .  Marital Status: Widowed    Spouse Name: N/A    Number of Children: N/A  . Years of Education: N/A   Occupational History  . Not on file.   Social History Main Topics  . Smoking status: Never Smoker   . Smokeless tobacco: Not on file  . Alcohol Use: No  . Drug Use: No  . Sexual Activity: No   Other Topics Concern  . Not on file   Social History Narrative  . No narrative on file    Objective:  Filed Vitals:   10/18/13 1543  BP: 136/70  Pulse: 56  Temp: 97.7 F (36.5 C)  Resp: 16     General appearance: alert, cooperative and appears stated age Ears: normal TM's and external ear canals both ears Throat: lips, mucosa, and tongue normal; teeth and gums normal Neck: no adenopathy, no carotid bruit, supple, symmetrical, trachea midline and thyroid not enlarged, symmetric, no tenderness/mass/nodules Back: symmetric, no curvature. ROM normal. No CVA tenderness. Lungs: clear to auscultation bilaterally Heart: regular rate and rhythm, S1, S2 normal, no murmur, click, rub or gallop Abdomen: soft, non-tender; bowel sounds normal; no masses,  no organomegaly Pulses: 2+ and symmetric Skin: Skin color, texture, turgor normal. No rashes or lesions Lymph nodes: Cervical, supraclavicular, and axillary nodes normal.  Assessment and Plan:  UTI (urinary tract infection) With pyuria on UA.  Empiric septra DS and probiotic x 7 days   Vascular dementia with depressed mood No behavioral issues except  she refuses to bathe.  Suggested having caregiver wear "scrubs' to look more authoritative and engender compliance.  Also recommended keeping  moist toilettes/baby wipes in her bathroom and encourage her to use them after voiding to maintain hygiene in the absence of regular bathing    A total of 25 minutes was spent with patient and patient's son and cargiver, more than half of which was spent in counseling, and coordination of care.  Updated Medication List Outpatient Encounter  Prescriptions as of 10/18/2013  Medication Sig  . ergocalciferol (DRISDOL) 50000 UNITS capsule Take 1 capsule (50,000 Units total) by mouth once a week.  . Cranberry 400 MG TABS Take 1 tablet (400 mg total) by mouth 2 (two) times daily after a meal.  . Dextromethorphan-Guaifenesin 20-200 MG/5ML LIQD Take 5 mLs by mouth every 8 (eight) hours as needed.  . meloxicam (MOBIC) 15 MG tablet Take 15 mg by mouth daily.  . polyethylene glycol powder (GLYCOLAX/MIRALAX) powder Take 17 g by mouth daily.  Marland Kitchen. sulfamethoxazole-trimethoprim (SEPTRA DS) 800-160 MG per tablet Take 1 tablet by mouth 2 (two) times daily.

## 2013-10-19 NOTE — Assessment & Plan Note (Signed)
No behavioral issues except she refuses to bathe.  Suggested having caregiver wear "scrubs' to look more authoritative and engender compliance.  Also recommended keeping  moist toilettes/baby wipes in her bathroom and encourage her to use them after voiding to maintain hygiene in the absence of regular bathing

## 2013-10-21 LAB — URINE CULTURE

## 2013-10-21 NOTE — Telephone Encounter (Signed)
Pt was notified 10/19/13, see result note

## 2013-10-24 ENCOUNTER — Emergency Department: Payer: Self-pay | Admitting: Emergency Medicine

## 2013-10-24 ENCOUNTER — Telehealth: Payer: Self-pay | Admitting: Internal Medicine

## 2013-10-24 DIAGNOSIS — S8990XA Unspecified injury of unspecified lower leg, initial encounter: Secondary | ICD-10-CM | POA: Diagnosis not present

## 2013-10-24 DIAGNOSIS — M25569 Pain in unspecified knee: Secondary | ICD-10-CM | POA: Diagnosis not present

## 2013-10-24 DIAGNOSIS — S8010XA Contusion of unspecified lower leg, initial encounter: Secondary | ICD-10-CM | POA: Diagnosis not present

## 2013-10-24 DIAGNOSIS — S79919A Unspecified injury of unspecified hip, initial encounter: Secondary | ICD-10-CM | POA: Diagnosis not present

## 2013-10-24 DIAGNOSIS — S99919A Unspecified injury of unspecified ankle, initial encounter: Secondary | ICD-10-CM | POA: Diagnosis not present

## 2013-10-24 DIAGNOSIS — S79929A Unspecified injury of unspecified thigh, initial encounter: Secondary | ICD-10-CM | POA: Diagnosis not present

## 2013-10-24 DIAGNOSIS — I10 Essential (primary) hypertension: Secondary | ICD-10-CM | POA: Diagnosis not present

## 2013-10-24 DIAGNOSIS — M25579 Pain in unspecified ankle and joints of unspecified foot: Secondary | ICD-10-CM | POA: Diagnosis not present

## 2013-10-24 DIAGNOSIS — M7989 Other specified soft tissue disorders: Secondary | ICD-10-CM | POA: Diagnosis not present

## 2013-10-24 DIAGNOSIS — T148XXA Other injury of unspecified body region, initial encounter: Secondary | ICD-10-CM | POA: Diagnosis not present

## 2013-10-24 LAB — HEPATIC FUNCTION PANEL
ALK PHOS: 84 U/L (ref 25–125)
ALT: 24 U/L (ref 7–35)
AST: 31 U/L (ref 13–35)
BILIRUBIN, TOTAL: 0.3 mg/dL

## 2013-10-24 LAB — COMPREHENSIVE METABOLIC PANEL
ALK PHOS: 84 U/L
ANION GAP: 5 — AB (ref 7–16)
Albumin: 3.5 g/dL (ref 3.4–5.0)
BUN: 23 mg/dL — ABNORMAL HIGH (ref 7–18)
Bilirubin,Total: 0.3 mg/dL (ref 0.2–1.0)
CALCIUM: 8.8 mg/dL (ref 8.5–10.1)
CHLORIDE: 104 mmol/L (ref 98–107)
CO2: 28 mmol/L (ref 21–32)
Creatinine: 1.35 mg/dL — ABNORMAL HIGH (ref 0.60–1.30)
EGFR (African American): 42 — ABNORMAL LOW
EGFR (Non-African Amer.): 36 — ABNORMAL LOW
GLUCOSE: 119 mg/dL — AB (ref 65–99)
Osmolality: 279 (ref 275–301)
Potassium: 4.2 mmol/L (ref 3.5–5.1)
SGOT(AST): 31 U/L (ref 15–37)
SGPT (ALT): 24 U/L
Sodium: 137 mmol/L (ref 136–145)
Total Protein: 7.3 g/dL (ref 6.4–8.2)

## 2013-10-24 LAB — CBC AND DIFFERENTIAL
HCT: 43 % (ref 36–46)
Hemoglobin: 13.9 g/dL (ref 12.0–16.0)
Platelets: 255 10*3/uL (ref 150–399)
WBC: 8.1 10^3/mL

## 2013-10-24 LAB — CBC
HCT: 42.7 % (ref 35.0–47.0)
HGB: 13.9 g/dL (ref 12.0–16.0)
MCH: 31 pg (ref 26.0–34.0)
MCHC: 32.5 g/dL (ref 32.0–36.0)
MCV: 95 fL (ref 80–100)
Platelet: 255 10*3/uL (ref 150–440)
RBC: 4.48 10*6/uL (ref 3.80–5.20)
RDW: 14 % (ref 11.5–14.5)
WBC: 8.1 10*3/uL (ref 3.6–11.0)

## 2013-10-24 LAB — BASIC METABOLIC PANEL
BUN: 23 mg/dL — AB (ref 4–21)
Creatinine: 1.4 mg/dL — AB (ref 0.5–1.1)
Glucose: 119 mg/dL
Potassium: 4.2 mmol/L (ref 3.4–5.3)
SODIUM: 137 mmol/L (ref 137–147)

## 2013-10-24 LAB — CK TOTAL AND CKMB (NOT AT ARMC)
CK, TOTAL: 126 U/L
CK-MB: 1.9 ng/mL (ref 0.5–3.6)

## 2013-10-24 LAB — TROPONIN I

## 2013-10-24 NOTE — Telephone Encounter (Signed)
Faxed order and office notes as requested.

## 2013-10-24 NOTE — Addendum Note (Signed)
Addended by: Sherlene ShamsULLO, Dore Oquin L on: 2020/05/1413 01:44 PM   Modules accepted: Orders

## 2013-10-24 NOTE — Telephone Encounter (Signed)
Please advise 

## 2013-10-24 NOTE — Telephone Encounter (Signed)
Order for Home Health and OV note from July 21 printed

## 2013-10-24 NOTE — Telephone Encounter (Signed)
Catherine Meyers from home health called in and stated that the son and daughter have been in the dementia consultations and wanted to know if [name] was in agreeance with home health and was needing an order and the last note from her last visit.

## 2013-10-28 ENCOUNTER — Telehealth: Payer: Self-pay | Admitting: *Deleted

## 2013-10-28 DIAGNOSIS — I69998 Other sequelae following unspecified cerebrovascular disease: Secondary | ICD-10-CM | POA: Diagnosis not present

## 2013-10-28 DIAGNOSIS — Z87440 Personal history of urinary (tract) infections: Secondary | ICD-10-CM | POA: Diagnosis not present

## 2013-10-28 DIAGNOSIS — E559 Vitamin D deficiency, unspecified: Secondary | ICD-10-CM | POA: Diagnosis not present

## 2013-10-28 DIAGNOSIS — F0151 Vascular dementia with behavioral disturbance: Secondary | ICD-10-CM | POA: Diagnosis not present

## 2013-10-28 DIAGNOSIS — Z9181 History of falling: Secondary | ICD-10-CM | POA: Diagnosis not present

## 2013-10-28 DIAGNOSIS — F0153 Vascular dementia, unspecified severity, with mood disturbance: Secondary | ICD-10-CM | POA: Diagnosis not present

## 2013-10-28 NOTE — Telephone Encounter (Signed)
Catherine Meyers called requesting to add Social Work for long term Set designerplanning and Nurse Aide for personal care and OT for dementia care and ADL's.  Please advise

## 2013-10-28 NOTE — Telephone Encounter (Signed)
Who is Gavin PoundDeborah?Family member of patient,  Or home health representative?

## 2013-10-31 NOTE — Telephone Encounter (Signed)
C[facility] Meyers is from [facility]

## 2013-10-31 NOTE — Telephone Encounter (Signed)
Ok to give verbal auth  For social work, Therapist, arturse Aide,  And OT

## 2013-10-31 NOTE — Telephone Encounter (Signed)
[name] notified as requested.

## 2013-11-01 ENCOUNTER — Ambulatory Visit (INDEPENDENT_AMBULATORY_CARE_PROVIDER_SITE_OTHER): Payer: Medicare Other | Admitting: Internal Medicine

## 2013-11-01 ENCOUNTER — Encounter: Payer: Self-pay | Admitting: Internal Medicine

## 2013-11-01 VITALS — BP 124/68 | HR 65 | Temp 97.4°F | Resp 16 | Ht 59.5 in | Wt 135.2 lb

## 2013-11-01 DIAGNOSIS — F329 Major depressive disorder, single episode, unspecified: Secondary | ICD-10-CM

## 2013-11-01 DIAGNOSIS — IMO0002 Reserved for concepts with insufficient information to code with codable children: Secondary | ICD-10-CM | POA: Diagnosis not present

## 2013-11-01 DIAGNOSIS — N9989 Other postprocedural complications and disorders of genitourinary system: Secondary | ICD-10-CM | POA: Diagnosis not present

## 2013-11-01 DIAGNOSIS — Y92009 Unspecified place in unspecified non-institutional (private) residence as the place of occurrence of the external cause: Secondary | ICD-10-CM

## 2013-11-01 DIAGNOSIS — N179 Acute kidney failure, unspecified: Secondary | ICD-10-CM | POA: Diagnosis not present

## 2013-11-01 DIAGNOSIS — F0153 Vascular dementia, unspecified severity, with mood disturbance: Secondary | ICD-10-CM

## 2013-11-01 DIAGNOSIS — W19XXXD Unspecified fall, subsequent encounter: Secondary | ICD-10-CM

## 2013-11-01 DIAGNOSIS — M899 Disorder of bone, unspecified: Secondary | ICD-10-CM

## 2013-11-01 DIAGNOSIS — W19XXXA Unspecified fall, initial encounter: Secondary | ICD-10-CM

## 2013-11-01 DIAGNOSIS — N990 Postprocedural (acute) (chronic) kidney failure: Secondary | ICD-10-CM

## 2013-11-01 DIAGNOSIS — E559 Vitamin D deficiency, unspecified: Secondary | ICD-10-CM

## 2013-11-01 DIAGNOSIS — M949 Disorder of cartilage, unspecified: Secondary | ICD-10-CM

## 2013-11-01 DIAGNOSIS — F0151 Vascular dementia with behavioral disturbance: Secondary | ICD-10-CM

## 2013-11-01 DIAGNOSIS — Z23 Encounter for immunization: Secondary | ICD-10-CM | POA: Diagnosis not present

## 2013-11-01 DIAGNOSIS — Z5189 Encounter for other specified aftercare: Secondary | ICD-10-CM

## 2013-11-01 DIAGNOSIS — M8580 Other specified disorders of bone density and structure, unspecified site: Secondary | ICD-10-CM

## 2013-11-01 DIAGNOSIS — S93409A Sprain of unspecified ligament of unspecified ankle, initial encounter: Secondary | ICD-10-CM

## 2013-11-01 MED ORDER — TETANUS-DIPHTH-ACELL PERTUSSIS 5-2.5-18.5 LF-MCG/0.5 IM SUSP: 0.5000 mL | Freq: Once | INTRAMUSCULAR | Status: DC

## 2013-11-01 MED ORDER — ZOSTER VACCINE LIVE 19400 UNT/0.65ML ~~LOC~~ SOLR: 0.6500 mL | Freq: Once | SUBCUTANEOUS | Status: DC

## 2013-11-01 NOTE — Progress Notes (Addendum)
Patient ID: Catherine RoesFay R Shad, female   DOB: 1927-10-25, 80 y.o.   MRN: [id number]  Patient Active Problem List   Diagnosis Date Noted  . Sprain of ankle, unspecified site 11/04/2013  . Fall at home 11/04/2013  . Acute renal failure 11/04/2013  . Osteopenia of the elderly 11/04/2013  . UTI (urinary tract infection) 10/19/2013  . Encounter for preventive health examination 06/02/2013  . Unspecified constipation 06/02/2013  . Vascular dementia with depressed mood 05/30/2012  . Vitamin D deficiency 05/05/2012  . Depression 05/04/2012    Subjective:  CC:   Chief Complaint  Patient presents with  . Follow-up    memory issues, fall last Monday . injured left ankle and elbow.    HPI:   Catherine Meyers is a 80 y.o. female who presents for Multiple issues , including declining functional status secondary to dementia , and recent fall.  Patient was evaluated and [facility] July [facility] for contusions and ankle sprain sustained during an unwitnessed fall at home.  Patient has no memory of event but per son, the paid caregiver reports that she was using her walker and lost her balance and fell over with the walker. Patient was complaining of left elbow and left ankle pain .  Both were evaluated with plain films and fractures were ruled out..  Ankle was wrapped in Ace bandage and family iced it twice daily fore the first 3 days. No NSAIDS were given. Patient  Has been ambulating for the last several days without complaining of pain .  Using walker.  Has been staying at son's home since the fall, and social work is assisting him in his goals of placing patient in an assisted living facility for patients with dementia. Marland Kitchen.  Hospice has been contacted and is planning to make tiwce weekly visits on Tues and Thursday for 1-2 hours to assist patient with  bathing and OT    Past Medical History  Diagnosis Date  . Arthritis   . Migraine   . Hepatitis   . Hepatitis, water-borne 1965    Past Surgical  History  Procedure Laterality Date  . Abdominal hysterectomy  1986  . Knee arthroscopy  2003       The following portions of the patient's history were reviewed and updated as appropriate: Allergies, current medications, and problem list.    Review of Systems:   Patient denies headache, fevers, malaise, unintentional weight loss, skin rash, eye pain, sinus congestion and sinus pain, sore throat, dysphagia,  hemoptysis , cough, dyspnea, wheezing, chest pain, palpitations, orthopnea, edema, abdominal pain, nausea, melena, diarrhea, constipation, flank pain, dysuria, hematuria, urinary  Frequency, nocturia, numbness, tingling, seizures,  Focal weakness, Loss of consciousness,  Tremor, insomnia, depression, anxiety, and suicidal ideation.     History   Social History  . Marital Status: Widowed    Spouse Name: N/A    Number of Children: N/A  . Years of Education: N/A   Occupational History  . Not on file.   Social History Main Topics  . Smoking status: Never Smoker   . Smokeless tobacco: Not on file  . Alcohol Use: No  . Drug Use: No  . Sexual Activity: No   Other Topics Concern  . Not on file   Social History Narrative  . No narrative on file    Objective:  Filed Vitals:   11/01/13 1305  BP: 124/68  Pulse: 65  Temp: 97.4 F (36.3 C)  Resp: 16  General appearance: alert, cooperative and appears stated age Neck: no adenopathy, no carotid bruit, supple, symmetrical, trachea midline and thyroid not enlarged, symmetric, no tenderness/mass/nodules Back: symmetric, kyphotic. No vertebral or CVA tenderness. Lungs: clear to auscultation bilaterally Heart: regular rate and rhythm, S1, S2 normal, no murmur, click, rub or gallop Abdomen: soft, non-tender; bowel sounds normal; no masses,  no organomegaly Pulses: 2+ and symmetric Skin: Skin color, texture, turgor normal. No rashes or lesions MSK: Tenderness  to palpation and manipulation over the lateral malleolus    Neuro: CNs 2-12 intact. DTRs 2+/4 in biceps, brachioradialis, patellars and achilles. Muscle strength 5/5 in upper and lower exremities. Fine resting tremor bilaterally both hands cerebellar function normal. Romberg negative.  No pronator drift.   Gait antalgic, uses a walker.    Assessment and Plan:  Vascular dementia with depressed mood Suggested by MRI done in 2014 showing severe cerebral and cerebellar atrophy and white matter changes consistent with chronic  Ischemia.  No behavioral issues except she refuses to bathe.  Her recent fall now making independent living problematic and unsafe.  Patient now living with son and will receive hospice assistance for bathing and hygiene while  son looks into options for  assisted living.    UTI (urinary tract infection) Treated, now taking daily cranberry tablets as suggested   Vitamin D deficiency Repeating level post treatment with Drisdol for level < 10  Sprain of ankle, unspecified site conitnue meloxicam,  Add tylenol prn and continue to ice when symptomatic   Fall at home Treated and released by [facility] ER July 27th .  EKD was normal except for 1st degree AV block.  Cardiac enzymes normal x 1 set.  Left hip was x rayed and no fractures were seen.  Osteopenia noted. Left ankle filsm were notable for ST swelling around the lateral mallelus. Left knee films noted a small effusion , no fractueres,  And chronic OA changes.  Cr was elevated to 1.35 and will be repeated today    Acute renal failure   Cr was 1.35 in Er on July 27 th.  myeloic was held until GFR was reassessed. Resolved by repeat assessment today.  Osteopenia of the elderly Noted on plain films of hip and ankle.  Will order DEXA scan and treat for T score < -2.0    A total of 40 minutes was spent with patient more than half of which was spent in counseling, reviewing records from other prviders and coordination of care.  Updated Medication List Outpatient Encounter  Prescriptions as of 11/01/2013  Medication Sig  . Cranberry 400 MG TABS Take 1 tablet (400 mg total) by mouth 2 (two) times daily after a meal.  . ergocalciferol (DRISDOL) 50000 UNITS capsule Take 1 capsule (50,000 Units total) by mouth once a week.  . meloxicam (MOBIC) 15 MG tablet Take 15 mg by mouth daily.  . Tdap (BOOSTRIX) 5-2.5-18.5 LF-MCG/0.5 injection Inject 0.5 mLs into the muscle once.  . zoster vaccine live, PF, (ZOSTAVAX) 78295 UNT/0.65ML injection Inject 19,400 Units into the skin once.  . [DISCONTINUED] Dextromethorphan-Guaifenesin 20-200 MG/5ML LIQD Take 5 mLs by mouth every 8 (eight) hours as needed.  . [DISCONTINUED] polyethylene glycol powder (GLYCOLAX/MIRALAX) powder Take 17 g by mouth daily.  . [DISCONTINUED] sulfamethoxazole-trimethoprim (SEPTRA DS) 800-160 MG per tablet Take 1 tablet by mouth 2 (two) times daily.     Orders Placed This Encounter  Procedures  . DG Bone Density  . Pneumococcal conjugate vaccine 13-valent  . Basic metabolic  panel  . CBC and differential  . Basic metabolic panel  . Hepatic function panel    Return in about 3 months (around 02/01/2014).

## 2013-11-01 NOTE — Patient Instructions (Signed)
Continue the daily cranberry tablets to prevent more urinary infections  We are rechecking your kidney function today  No motrin or meloxicam until you hear from me  [facility] to use tylenol 500 mg every 6 hours (2000 mg daily max) and ice as needed   Tetanus (tdap) vaccine and  Shingles vaccines are advised and are less $$$$ at a local pharmacy or Health Dept

## 2013-11-01 NOTE — Progress Notes (Signed)
Pre-visit discussion using our clinic review tool. No additional management support is needed unless otherwise documented below in the visit note.  

## 2013-11-02 ENCOUNTER — Encounter: Payer: Self-pay | Admitting: Internal Medicine

## 2013-11-02 LAB — BASIC METABOLIC PANEL
BUN: 23 mg/dL (ref 6–23)
CO2: 28 mEq/L (ref 19–32)
Calcium: 9.1 mg/dL (ref 8.4–10.5)
Chloride: 105 mEq/L (ref 96–112)
Creatinine, Ser: 0.9 mg/dL (ref 0.4–1.2)
GFR: 60.33 mL/min (ref >60.00)
Glucose, Bld: 64 mg/dL — ABNORMAL LOW (ref 70–99)
Potassium: 4.6 mEq/L (ref 3.5–5.1)
SODIUM: 137 meq/L (ref 135–145)

## 2013-11-03 DIAGNOSIS — Z87440 Personal history of urinary (tract) infections: Secondary | ICD-10-CM | POA: Diagnosis not present

## 2013-11-03 DIAGNOSIS — I69998 Other sequelae following unspecified cerebrovascular disease: Secondary | ICD-10-CM | POA: Diagnosis not present

## 2013-11-03 DIAGNOSIS — F0153 Vascular dementia, unspecified severity, with mood disturbance: Secondary | ICD-10-CM | POA: Diagnosis not present

## 2013-11-03 DIAGNOSIS — Z9181 History of falling: Secondary | ICD-10-CM | POA: Diagnosis not present

## 2013-11-03 DIAGNOSIS — E559 Vitamin D deficiency, unspecified: Secondary | ICD-10-CM | POA: Diagnosis not present

## 2013-11-03 DIAGNOSIS — F0151 Vascular dementia with behavioral disturbance: Secondary | ICD-10-CM | POA: Diagnosis not present

## 2013-11-04 ENCOUNTER — Telehealth: Payer: Self-pay | Admitting: Internal Medicine

## 2013-11-04 ENCOUNTER — Encounter: Payer: Self-pay | Admitting: Internal Medicine

## 2013-11-04 DIAGNOSIS — F0153 Vascular dementia, unspecified severity, with mood disturbance: Secondary | ICD-10-CM | POA: Diagnosis not present

## 2013-11-04 DIAGNOSIS — I69998 Other sequelae following unspecified cerebrovascular disease: Secondary | ICD-10-CM | POA: Diagnosis not present

## 2013-11-04 DIAGNOSIS — Y92009 Unspecified place in unspecified non-institutional (private) residence as the place of occurrence of the external cause: Secondary | ICD-10-CM

## 2013-11-04 DIAGNOSIS — E559 Vitamin D deficiency, unspecified: Secondary | ICD-10-CM | POA: Diagnosis not present

## 2013-11-04 DIAGNOSIS — M8580 Other specified disorders of bone density and structure, unspecified site: Secondary | ICD-10-CM | POA: Insufficient documentation

## 2013-11-04 DIAGNOSIS — S93409A Sprain of unspecified ligament of unspecified ankle, initial encounter: Secondary | ICD-10-CM | POA: Insufficient documentation

## 2013-11-04 DIAGNOSIS — Z9181 History of falling: Secondary | ICD-10-CM | POA: Diagnosis not present

## 2013-11-04 DIAGNOSIS — N179 Acute kidney failure, unspecified: Secondary | ICD-10-CM | POA: Insufficient documentation

## 2013-11-04 DIAGNOSIS — F0151 Vascular dementia with behavioral disturbance: Secondary | ICD-10-CM | POA: Diagnosis not present

## 2013-11-04 DIAGNOSIS — Z87440 Personal history of urinary (tract) infections: Secondary | ICD-10-CM | POA: Diagnosis not present

## 2013-11-04 DIAGNOSIS — W19XXXA Unspecified fall, initial encounter: Secondary | ICD-10-CM | POA: Insufficient documentation

## 2013-11-04 NOTE — Assessment & Plan Note (Signed)
Treated, now taking daily cranberry tablets as suggested

## 2013-11-04 NOTE — Assessment & Plan Note (Signed)
Cr was 1.35 in Er on July 27 th.  myeloic was held until GFR was reassessed. Resolved by repeat assessment today.

## 2013-11-04 NOTE — Assessment & Plan Note (Signed)
Repeating level post treatment with Drisdol for level < 10

## 2013-11-04 NOTE — Assessment & Plan Note (Signed)
Treated and released by [facility] ER July 27th .  EKD was normal except for 1st degree AV block.  Cardiac enzymes normal x 1 set.  Left hip was x rayed and no fractures were seen.  Osteopenia noted. Left ankle filsm were notable for ST swelling around the lateral mallelus. Left knee films noted a small effusion , no fractueres,  And chronic OA changes.  Cr was elevated to 1.35 and will be repeated today

## 2013-11-04 NOTE — Telephone Encounter (Signed)
Her x rays noted osteopenia, and she has never had a bone density test..  She is at increased risk for hip fractures, so I am recommending a bone density test so we can treat if indicated. i have ordered it to be done at [facility] please let her son know.

## 2013-11-04 NOTE — Assessment & Plan Note (Signed)
conitnue meloxicam,  Add tylenol prn and continue to ice when symptomatic

## 2013-11-04 NOTE — Telephone Encounter (Signed)
Patient son notified and voiced understanding. 

## 2013-11-04 NOTE — Assessment & Plan Note (Addendum)
Suggested by MRI done in 2014 showing severe cerebral and cerebellar atrophy and white matter changes consistent with chronic  Ischemia.  No behavioral issues except she refuses to bathe.  Her recent fall now making independent living problematic and unsafe.  Patient now living with son and will receive hospice assistance for bathing and hygiene while  son looks into options for  assisted living.

## 2013-11-04 NOTE — Assessment & Plan Note (Signed)
Noted on plain films of hip and ankle.  Will order DEXA scan and treat for T score < -2.0

## 2013-11-08 ENCOUNTER — Ambulatory Visit (INDEPENDENT_AMBULATORY_CARE_PROVIDER_SITE_OTHER): Payer: Medicare Other | Admitting: *Deleted

## 2013-11-08 DIAGNOSIS — I69998 Other sequelae following unspecified cerebrovascular disease: Secondary | ICD-10-CM | POA: Diagnosis not present

## 2013-11-08 DIAGNOSIS — F0153 Vascular dementia, unspecified severity, with mood disturbance: Secondary | ICD-10-CM | POA: Diagnosis not present

## 2013-11-08 DIAGNOSIS — F0151 Vascular dementia with behavioral disturbance: Secondary | ICD-10-CM | POA: Diagnosis not present

## 2013-11-08 DIAGNOSIS — Z0279 Encounter for issue of other medical certificate: Secondary | ICD-10-CM

## 2013-11-08 DIAGNOSIS — Z111 Encounter for screening for respiratory tuberculosis: Secondary | ICD-10-CM | POA: Diagnosis not present

## 2013-11-08 DIAGNOSIS — E559 Vitamin D deficiency, unspecified: Secondary | ICD-10-CM | POA: Diagnosis not present

## 2013-11-08 DIAGNOSIS — Z9181 History of falling: Secondary | ICD-10-CM | POA: Diagnosis not present

## 2013-11-08 DIAGNOSIS — Z87440 Personal history of urinary (tract) infections: Secondary | ICD-10-CM | POA: Diagnosis not present

## 2013-11-09 ENCOUNTER — Telehealth: Payer: Self-pay | Admitting: Internal Medicine

## 2013-11-09 NOTE — Telephone Encounter (Signed)
[facility] to be filled out on 11/07/13, paper work filled out and given back to this nurse on 11/09/13. [facility]ed to [facility] on same date.  Copy made for scan to chart and billing.

## 2013-11-10 DIAGNOSIS — I69998 Other sequelae following unspecified cerebrovascular disease: Secondary | ICD-10-CM | POA: Diagnosis not present

## 2013-11-10 DIAGNOSIS — F0151 Vascular dementia with behavioral disturbance: Secondary | ICD-10-CM | POA: Diagnosis not present

## 2013-11-10 DIAGNOSIS — F0153 Vascular dementia, unspecified severity, with mood disturbance: Secondary | ICD-10-CM | POA: Diagnosis not present

## 2013-11-10 DIAGNOSIS — Z9181 History of falling: Secondary | ICD-10-CM | POA: Diagnosis not present

## 2013-11-10 DIAGNOSIS — Z87440 Personal history of urinary (tract) infections: Secondary | ICD-10-CM | POA: Diagnosis not present

## 2013-11-10 DIAGNOSIS — E559 Vitamin D deficiency, unspecified: Secondary | ICD-10-CM | POA: Diagnosis not present

## 2013-11-10 LAB — TB SKIN TEST
INDURATION: 0 mm
TB Skin Test: NEGATIVE

## 2013-11-11 DIAGNOSIS — Z0279 Encounter for issue of other medical certificate: Secondary | ICD-10-CM

## 2013-11-15 ENCOUNTER — Telehealth: Payer: Self-pay | Admitting: Internal Medicine

## 2013-11-15 NOTE — Telephone Encounter (Signed)
[facility] called requested written order for Vitals signs once per week and for perimeters, please advise perimeters you would like to use I will type up and fax to [phone]

## 2013-11-15 NOTE — Telephone Encounter (Signed)
Notified facility no need for vitals no HX of hypertension.

## 2013-11-15 NOTE — Telephone Encounter (Signed)
She doesn't need vital signs once a week., she has no history of hypertension

## 2013-11-16 ENCOUNTER — Telehealth: Payer: Self-pay | Admitting: Internal Medicine

## 2013-11-16 NOTE — Telephone Encounter (Signed)
[facility] states that DX of vascular dementia with depression needs more documentation to support such HX of CVA or need a more specific DX of dementia. Faxed to [phone]

## 2013-11-17 NOTE — Telephone Encounter (Signed)
Notes faxed.

## 2013-11-17 NOTE — Telephone Encounter (Signed)
Last OV note has been amended  And printed please fax to number below

## 2013-11-18 DIAGNOSIS — Z9181 History of falling: Secondary | ICD-10-CM | POA: Diagnosis not present

## 2013-11-18 DIAGNOSIS — Z87440 Personal history of urinary (tract) infections: Secondary | ICD-10-CM | POA: Diagnosis not present

## 2013-11-18 DIAGNOSIS — F0151 Vascular dementia with behavioral disturbance: Secondary | ICD-10-CM | POA: Diagnosis not present

## 2013-11-18 DIAGNOSIS — I69998 Other sequelae following unspecified cerebrovascular disease: Secondary | ICD-10-CM | POA: Diagnosis not present

## 2013-11-18 DIAGNOSIS — F0153 Vascular dementia, unspecified severity, with mood disturbance: Secondary | ICD-10-CM | POA: Diagnosis not present

## 2013-11-18 DIAGNOSIS — E559 Vitamin D deficiency, unspecified: Secondary | ICD-10-CM | POA: Diagnosis not present

## 2013-11-22 DIAGNOSIS — M949 Disorder of cartilage, unspecified: Secondary | ICD-10-CM | POA: Diagnosis not present

## 2013-11-22 DIAGNOSIS — R269 Unspecified abnormalities of gait and mobility: Secondary | ICD-10-CM | POA: Diagnosis not present

## 2013-11-22 DIAGNOSIS — M899 Disorder of bone, unspecified: Secondary | ICD-10-CM | POA: Diagnosis not present

## 2013-11-22 DIAGNOSIS — F0151 Vascular dementia with behavioral disturbance: Secondary | ICD-10-CM | POA: Diagnosis not present

## 2013-11-22 DIAGNOSIS — F0153 Vascular dementia, unspecified severity, with mood disturbance: Secondary | ICD-10-CM | POA: Diagnosis not present

## 2013-11-22 DIAGNOSIS — Z87440 Personal history of urinary (tract) infections: Secondary | ICD-10-CM | POA: Diagnosis not present

## 2013-11-22 DIAGNOSIS — Z9181 History of falling: Secondary | ICD-10-CM | POA: Diagnosis not present

## 2013-11-22 DIAGNOSIS — M129 Arthropathy, unspecified: Secondary | ICD-10-CM | POA: Diagnosis not present

## 2013-11-22 DIAGNOSIS — S93409A Sprain of unspecified ligament of unspecified ankle, initial encounter: Secondary | ICD-10-CM | POA: Diagnosis not present

## 2013-11-24 DIAGNOSIS — F0153 Vascular dementia, unspecified severity, with mood disturbance: Secondary | ICD-10-CM | POA: Diagnosis not present

## 2013-11-24 DIAGNOSIS — M129 Arthropathy, unspecified: Secondary | ICD-10-CM | POA: Diagnosis not present

## 2013-11-24 DIAGNOSIS — F0151 Vascular dementia with behavioral disturbance: Secondary | ICD-10-CM | POA: Diagnosis not present

## 2013-11-24 DIAGNOSIS — Z9181 History of falling: Secondary | ICD-10-CM | POA: Diagnosis not present

## 2013-11-24 DIAGNOSIS — M899 Disorder of bone, unspecified: Secondary | ICD-10-CM | POA: Diagnosis not present

## 2013-11-24 DIAGNOSIS — S93409A Sprain of unspecified ligament of unspecified ankle, initial encounter: Secondary | ICD-10-CM | POA: Diagnosis not present

## 2013-11-24 DIAGNOSIS — R269 Unspecified abnormalities of gait and mobility: Secondary | ICD-10-CM | POA: Diagnosis not present

## 2013-11-29 DIAGNOSIS — M949 Disorder of cartilage, unspecified: Secondary | ICD-10-CM | POA: Diagnosis not present

## 2013-11-29 DIAGNOSIS — R269 Unspecified abnormalities of gait and mobility: Secondary | ICD-10-CM | POA: Diagnosis not present

## 2013-11-29 DIAGNOSIS — M899 Disorder of bone, unspecified: Secondary | ICD-10-CM | POA: Diagnosis not present

## 2013-11-29 DIAGNOSIS — F0151 Vascular dementia with behavioral disturbance: Secondary | ICD-10-CM | POA: Diagnosis not present

## 2013-11-29 DIAGNOSIS — S93409A Sprain of unspecified ligament of unspecified ankle, initial encounter: Secondary | ICD-10-CM | POA: Diagnosis not present

## 2013-11-29 DIAGNOSIS — M129 Arthropathy, unspecified: Secondary | ICD-10-CM | POA: Diagnosis not present

## 2013-11-29 DIAGNOSIS — Z9181 History of falling: Secondary | ICD-10-CM | POA: Diagnosis not present

## 2013-11-29 DIAGNOSIS — F0153 Vascular dementia, unspecified severity, with mood disturbance: Secondary | ICD-10-CM | POA: Diagnosis not present

## 2013-12-01 DIAGNOSIS — M129 Arthropathy, unspecified: Secondary | ICD-10-CM | POA: Diagnosis not present

## 2013-12-01 DIAGNOSIS — S93409A Sprain of unspecified ligament of unspecified ankle, initial encounter: Secondary | ICD-10-CM | POA: Diagnosis not present

## 2013-12-01 DIAGNOSIS — F0153 Vascular dementia, unspecified severity, with mood disturbance: Secondary | ICD-10-CM | POA: Diagnosis not present

## 2013-12-01 DIAGNOSIS — M949 Disorder of cartilage, unspecified: Secondary | ICD-10-CM | POA: Diagnosis not present

## 2013-12-01 DIAGNOSIS — Z9181 History of falling: Secondary | ICD-10-CM | POA: Diagnosis not present

## 2013-12-01 DIAGNOSIS — M899 Disorder of bone, unspecified: Secondary | ICD-10-CM | POA: Diagnosis not present

## 2013-12-01 DIAGNOSIS — R269 Unspecified abnormalities of gait and mobility: Secondary | ICD-10-CM | POA: Diagnosis not present

## 2013-12-01 DIAGNOSIS — F0151 Vascular dementia with behavioral disturbance: Secondary | ICD-10-CM | POA: Diagnosis not present

## 2013-12-05 DIAGNOSIS — F0151 Vascular dementia with behavioral disturbance: Secondary | ICD-10-CM | POA: Diagnosis not present

## 2013-12-05 DIAGNOSIS — Z9181 History of falling: Secondary | ICD-10-CM | POA: Diagnosis not present

## 2013-12-05 DIAGNOSIS — F0153 Vascular dementia, unspecified severity, with mood disturbance: Secondary | ICD-10-CM | POA: Diagnosis not present

## 2013-12-05 DIAGNOSIS — M899 Disorder of bone, unspecified: Secondary | ICD-10-CM | POA: Diagnosis not present

## 2013-12-05 DIAGNOSIS — S93409A Sprain of unspecified ligament of unspecified ankle, initial encounter: Secondary | ICD-10-CM | POA: Diagnosis not present

## 2013-12-05 DIAGNOSIS — R269 Unspecified abnormalities of gait and mobility: Secondary | ICD-10-CM | POA: Diagnosis not present

## 2013-12-05 DIAGNOSIS — M129 Arthropathy, unspecified: Secondary | ICD-10-CM | POA: Diagnosis not present

## 2013-12-06 DIAGNOSIS — M899 Disorder of bone, unspecified: Secondary | ICD-10-CM | POA: Diagnosis not present

## 2013-12-06 DIAGNOSIS — Z9181 History of falling: Secondary | ICD-10-CM | POA: Diagnosis not present

## 2013-12-06 DIAGNOSIS — M129 Arthropathy, unspecified: Secondary | ICD-10-CM | POA: Diagnosis not present

## 2013-12-06 DIAGNOSIS — F0153 Vascular dementia, unspecified severity, with mood disturbance: Secondary | ICD-10-CM | POA: Diagnosis not present

## 2013-12-06 DIAGNOSIS — F0151 Vascular dementia with behavioral disturbance: Secondary | ICD-10-CM | POA: Diagnosis not present

## 2013-12-06 DIAGNOSIS — S93409A Sprain of unspecified ligament of unspecified ankle, initial encounter: Secondary | ICD-10-CM | POA: Diagnosis not present

## 2013-12-06 DIAGNOSIS — R269 Unspecified abnormalities of gait and mobility: Secondary | ICD-10-CM | POA: Diagnosis not present

## 2013-12-08 DIAGNOSIS — F0151 Vascular dementia with behavioral disturbance: Secondary | ICD-10-CM | POA: Diagnosis not present

## 2013-12-08 DIAGNOSIS — R269 Unspecified abnormalities of gait and mobility: Secondary | ICD-10-CM | POA: Diagnosis not present

## 2013-12-08 DIAGNOSIS — Z9181 History of falling: Secondary | ICD-10-CM | POA: Diagnosis not present

## 2013-12-08 DIAGNOSIS — S93409A Sprain of unspecified ligament of unspecified ankle, initial encounter: Secondary | ICD-10-CM | POA: Diagnosis not present

## 2013-12-08 DIAGNOSIS — F0153 Vascular dementia, unspecified severity, with mood disturbance: Secondary | ICD-10-CM | POA: Diagnosis not present

## 2013-12-08 DIAGNOSIS — M129 Arthropathy, unspecified: Secondary | ICD-10-CM | POA: Diagnosis not present

## 2013-12-08 DIAGNOSIS — M899 Disorder of bone, unspecified: Secondary | ICD-10-CM | POA: Diagnosis not present

## 2013-12-12 DIAGNOSIS — S93409A Sprain of unspecified ligament of unspecified ankle, initial encounter: Secondary | ICD-10-CM | POA: Diagnosis not present

## 2013-12-12 DIAGNOSIS — M129 Arthropathy, unspecified: Secondary | ICD-10-CM | POA: Diagnosis not present

## 2013-12-12 DIAGNOSIS — Z9181 History of falling: Secondary | ICD-10-CM | POA: Diagnosis not present

## 2013-12-12 DIAGNOSIS — F0153 Vascular dementia, unspecified severity, with mood disturbance: Secondary | ICD-10-CM | POA: Diagnosis not present

## 2013-12-12 DIAGNOSIS — M899 Disorder of bone, unspecified: Secondary | ICD-10-CM | POA: Diagnosis not present

## 2013-12-12 DIAGNOSIS — R269 Unspecified abnormalities of gait and mobility: Secondary | ICD-10-CM | POA: Diagnosis not present

## 2013-12-12 DIAGNOSIS — F0151 Vascular dementia with behavioral disturbance: Secondary | ICD-10-CM | POA: Diagnosis not present

## 2013-12-16 DIAGNOSIS — S93409A Sprain of unspecified ligament of unspecified ankle, initial encounter: Secondary | ICD-10-CM | POA: Diagnosis not present

## 2013-12-16 DIAGNOSIS — M949 Disorder of cartilage, unspecified: Secondary | ICD-10-CM | POA: Diagnosis not present

## 2013-12-16 DIAGNOSIS — F0151 Vascular dementia with behavioral disturbance: Secondary | ICD-10-CM | POA: Diagnosis not present

## 2013-12-16 DIAGNOSIS — R269 Unspecified abnormalities of gait and mobility: Secondary | ICD-10-CM | POA: Diagnosis not present

## 2013-12-16 DIAGNOSIS — M899 Disorder of bone, unspecified: Secondary | ICD-10-CM | POA: Diagnosis not present

## 2013-12-16 DIAGNOSIS — Z9181 History of falling: Secondary | ICD-10-CM | POA: Diagnosis not present

## 2013-12-16 DIAGNOSIS — F0153 Vascular dementia, unspecified severity, with mood disturbance: Secondary | ICD-10-CM | POA: Diagnosis not present

## 2013-12-16 DIAGNOSIS — M129 Arthropathy, unspecified: Secondary | ICD-10-CM | POA: Diagnosis not present

## 2014-01-03 ENCOUNTER — Encounter: Payer: Self-pay | Admitting: Internal Medicine

## 2014-01-04 MED ORDER — QUETIAPINE FUMARATE 25 MG PO TABS: 25.0000 mg | ORAL_TABLET | Freq: Every day | ORAL | Status: DC

## 2014-01-11 DIAGNOSIS — Z23 Encounter for immunization: Secondary | ICD-10-CM | POA: Diagnosis not present

## 2014-01-16 DIAGNOSIS — M79672 Pain in left foot: Secondary | ICD-10-CM | POA: Diagnosis not present

## 2014-01-16 DIAGNOSIS — B351 Tinea unguium: Secondary | ICD-10-CM | POA: Diagnosis not present

## 2014-01-16 DIAGNOSIS — M79671 Pain in right foot: Secondary | ICD-10-CM | POA: Diagnosis not present

## 2014-01-26 ENCOUNTER — Telehealth: Payer: Self-pay | Admitting: Internal Medicine

## 2014-01-26 NOTE — Telephone Encounter (Signed)
What about tomorrow 2:30 on hold spot?

## 2014-01-26 NOTE — Telephone Encounter (Signed)
Caregiver request an appt for cough for pt.Advise no appts within the next few week. Caregiver stated that she doesn't [facility] to [facility] the [facility] and would rather come to this office. Please advise of appt date and time.msn

## 2014-02-01 ENCOUNTER — Encounter: Payer: Self-pay | Admitting: Internal Medicine

## 2014-02-01 ENCOUNTER — Ambulatory Visit (INDEPENDENT_AMBULATORY_CARE_PROVIDER_SITE_OTHER): Payer: Medicare Other | Admitting: Internal Medicine

## 2014-02-01 VITALS — BP 138/60 | HR 63 | Temp 97.4°F | Resp 16 | Ht 59.5 in | Wt 140.8 lb

## 2014-02-01 DIAGNOSIS — F0151 Vascular dementia with behavioral disturbance: Secondary | ICD-10-CM

## 2014-02-01 DIAGNOSIS — F0153 Vascular dementia, unspecified severity, with mood disturbance: Secondary | ICD-10-CM

## 2014-02-01 DIAGNOSIS — F329 Major depressive disorder, single episode, unspecified: Secondary | ICD-10-CM

## 2014-02-01 DIAGNOSIS — R059 Cough, unspecified: Secondary | ICD-10-CM

## 2014-02-01 DIAGNOSIS — R05 Cough: Secondary | ICD-10-CM | POA: Diagnosis not present

## 2014-02-01 DIAGNOSIS — S93409D Sprain of unspecified ligament of unspecified ankle, subsequent encounter: Secondary | ICD-10-CM

## 2014-02-01 MED ORDER — DIPHENHYDRAMINE HCL (SLEEP) 25 MG PO CAPS: ORAL_CAPSULE | ORAL | Status: DC

## 2014-02-01 MED ORDER — ZOSTER VACCINE LIVE 19400 UNT/0.65ML ~~LOC~~ SOLR: 0.6500 mL | Freq: Once | SUBCUTANEOUS | Status: DC

## 2014-02-01 MED ORDER — QUETIAPINE FUMARATE 50 MG PO TABS: 50.0000 mg | ORAL_TABLET | Freq: Every day | ORAL | Status: DC

## 2014-02-01 NOTE — Progress Notes (Signed)
Pre-visit discussion using our clinic review tool. No additional management support is needed unless otherwise documented below in the visit note.  

## 2014-02-01 NOTE — Progress Notes (Signed)
Patient ID: Catherine Meyers, female   DOB: 10-10-1927, 80 y.o.   MRN: [id number]   Patient Active Problem List   Diagnosis Date Noted  . Cough 02/04/2014  . Sprain of ankle 11/04/2013  . Fall at home 11/04/2013  . Acute renal failure 11/04/2013  . Osteopenia of the elderly 11/04/2013  . UTI (urinary tract infection) 10/19/2013  . Encounter for preventive health examination 06/02/2013  . Unspecified constipation 06/02/2013  . Vascular dementia with depressed mood 05/30/2012  . Vitamin D deficiency 05/05/2012  . Depression 05/04/2012    Subjective:  CC:   Chief Complaint  Patient presents with  . Cough    for several months ha increased in the last month.    HPI:   Catherine RoesFay R Kington is a 80 y.o. female who presents for   Follow up on dementia  And persistent cough.  Since her last visit she has been transitioned from home residence to an assisted living facility and is accompanied today by the facility director.  The staff has reported that despite initiation of Seroquel 25 mg at bedtime, patient continues to remain awake and tries to wander at night .  Staff has had difficulty putting patient to bed.   She does not ambulate much during the day, despite using a walker and taking mleoxicam for OA. The director [facility]     2) she has been having a nonproductive cough that has been bothering the staff.  Patient did not cough at all during appt or during nursing evaluation.  Patient reports runny nose and PND   3) Wants shingles vaccine.    Past Medical History  Diagnosis Date  . Arthritis   . Migraine   . Hepatitis   . Hepatitis, water-borne 1965    Past Surgical History  Procedure Laterality Date  . Abdominal hysterectomy  1986  . Knee arthroscopy  2003       The following portions of the patient's history were reviewed and updated as appropriate: Allergies, current medications, and problem list.    Review of Systems:   Patient  denies headache, fevers, malaise, unintentional weight loss, skin rash, eye pain, sinus congestion and sinus pain, sore throat, dysphagia,  hemoptysis , cough, dyspnea, wheezing, chest pain, palpitations, orthopnea, edema, abdominal pain, nausea, melena, diarrhea, constipation, flank pain, dysuria, hematuria, urinary  Frequency, nocturia, numbness, tingling, seizures,  Focal weakness, Loss of consciousness,  Tremor, insomnia, depression, anxiety, and suicidal ideation.     History   Social History  . Marital Status: Widowed    Spouse Name: N/A    Number of Children: N/A  . Years of Education: N/A   Occupational History  . Not on file.   Social History Main Topics  . Smoking status: Never Smoker   . Smokeless tobacco: Not on file  . Alcohol Use: No  . Drug Use: No  . Sexual Activity: No   Other Topics Concern  . Not on file   Social History Narrative    Objective:  Filed Vitals:   02/01/14 1054  BP: 138/60  Pulse: 63  Temp: 97.4 F (36.3 C)  Resp: 16     General appearance: alert, cooperative and appears stated age Ears: normal TM's and external ear canals both ears Throat: lips, mucosa, and tongue normal; teeth and gums normal Neck: no adenopathy, no carotid bruit, supple, symmetrical, trachea midline and thyroid not enlarged, symmetric, no tenderness/mass/nodules Back: symmetric, no curvature. ROM normal. No  CVA tenderness. Lungs: clear to auscultation bilaterally Heart: regular rate and rhythm, S1, S2 normal, no murmur, click, rub or gallop Abdomen: soft, non-tender; bowel sounds normal; no masses,  no organomegaly Pulses: 2+ and symmetric Skin: Skin color, texture, turgor normal. No rashes or lesions Lymph nodes: Cervical, supraclavicular, and axillary nodes normal.  Assessment and Plan:  Sprain of [facility] for [facility] and gait.   Vascular dementia with depressed mood She has been having some nighttime agitation with  wandering.  Will increase seroquel to 50 mg at bedtime.   Cough Her exam is normal.  Will treat for PND with trial of benadryl 25 mg at bedtime.    Updated Medication List Outpatient Encounter Prescriptions as of 02/01/2014  Medication Sig  . Cranberry 400 MG TABS Take 1 tablet (400 mg total) by mouth 2 (two) times daily after a meal.  . ergocalciferol (DRISDOL) 50000 UNITS capsule Take 1 capsule (50,000 Units total) by mouth once a week.  . meloxicam (MOBIC) 15 MG tablet Take 15 mg by mouth daily.  . QUEtiapine (SEROQUEL) 50 MG tablet Take 1 tablet (50 mg total) by mouth at bedtime.  . [DISCONTINUED] QUEtiapine (SEROQUEL) 25 MG tablet Take 1 tablet (25 mg total) by mouth at bedtime.  . DiphenhydrAMINE HCl, Sleep, 25 MG CAPS 1 tablet daily at bedtime for cough  . Tdap (BOOSTRIX) 5-2.5-18.5 LF-MCG/0.5 injection Inject 0.5 mLs into the muscle once.  . zoster vaccine live, PF, (ZOSTAVAX) [id number] UNT/0.65ML injection Inject 19,400 Units into the skin once.  . [DISCONTINUED] zoster vaccine live, PF, (ZOSTAVAX) [id number] UNT/0.65ML injection Inject 19,400 Units into the skin once.     No orders of the defined types were placed in this encounter.    No Follow-up on file.

## 2014-02-04 ENCOUNTER — Encounter: Payer: Self-pay | Admitting: Internal Medicine

## 2014-02-04 DIAGNOSIS — R059 Cough, unspecified: Secondary | ICD-10-CM | POA: Insufficient documentation

## 2014-02-04 DIAGNOSIS — R05 Cough: Secondary | ICD-10-CM | POA: Insufficient documentation

## 2014-02-04 NOTE — Assessment & Plan Note (Signed)
[facility] for mobility and gait.

## 2014-02-04 NOTE — Assessment & Plan Note (Signed)
Her exam is normal.  Will treat for PND with trial of benadryl 25 mg at bedtime.

## 2014-02-04 NOTE — Assessment & Plan Note (Signed)
She has been having some nighttime agitation with wandering.  Will increase seroquel to 50 mg at bedtime.

## 2014-02-06 DIAGNOSIS — M810 Age-related osteoporosis without current pathological fracture: Secondary | ICD-10-CM | POA: Diagnosis not present

## 2014-02-06 DIAGNOSIS — Z9181 History of falling: Secondary | ICD-10-CM | POA: Diagnosis not present

## 2014-02-06 DIAGNOSIS — G309 Alzheimer's disease, unspecified: Secondary | ICD-10-CM | POA: Diagnosis not present

## 2014-02-06 DIAGNOSIS — R2689 Other abnormalities of gait and mobility: Secondary | ICD-10-CM | POA: Diagnosis not present

## 2014-02-06 DIAGNOSIS — F329 Major depressive disorder, single episode, unspecified: Secondary | ICD-10-CM | POA: Diagnosis not present

## 2014-02-06 DIAGNOSIS — F0280 Dementia in other diseases classified elsewhere without behavioral disturbance: Secondary | ICD-10-CM | POA: Diagnosis not present

## 2014-02-06 DIAGNOSIS — Z87440 Personal history of urinary (tract) infections: Secondary | ICD-10-CM | POA: Diagnosis not present

## 2014-02-08 DIAGNOSIS — F329 Major depressive disorder, single episode, unspecified: Secondary | ICD-10-CM | POA: Diagnosis not present

## 2014-02-08 DIAGNOSIS — M810 Age-related osteoporosis without current pathological fracture: Secondary | ICD-10-CM | POA: Diagnosis not present

## 2014-02-08 DIAGNOSIS — G309 Alzheimer's disease, unspecified: Secondary | ICD-10-CM | POA: Diagnosis not present

## 2014-02-08 DIAGNOSIS — Z9181 History of falling: Secondary | ICD-10-CM | POA: Diagnosis not present

## 2014-02-08 DIAGNOSIS — F0280 Dementia in other diseases classified elsewhere without behavioral disturbance: Secondary | ICD-10-CM | POA: Diagnosis not present

## 2014-02-08 DIAGNOSIS — R2689 Other abnormalities of gait and mobility: Secondary | ICD-10-CM | POA: Diagnosis not present

## 2014-02-13 DIAGNOSIS — G309 Alzheimer's disease, unspecified: Secondary | ICD-10-CM | POA: Diagnosis not present

## 2014-02-13 DIAGNOSIS — R2689 Other abnormalities of gait and mobility: Secondary | ICD-10-CM | POA: Diagnosis not present

## 2014-02-13 DIAGNOSIS — Z9181 History of falling: Secondary | ICD-10-CM | POA: Diagnosis not present

## 2014-02-13 DIAGNOSIS — M810 Age-related osteoporosis without current pathological fracture: Secondary | ICD-10-CM | POA: Diagnosis not present

## 2014-02-13 DIAGNOSIS — F0280 Dementia in other diseases classified elsewhere without behavioral disturbance: Secondary | ICD-10-CM | POA: Diagnosis not present

## 2014-02-13 DIAGNOSIS — F329 Major depressive disorder, single episode, unspecified: Secondary | ICD-10-CM | POA: Diagnosis not present

## 2014-02-15 DIAGNOSIS — Z9181 History of falling: Secondary | ICD-10-CM | POA: Diagnosis not present

## 2014-02-15 DIAGNOSIS — F0280 Dementia in other diseases classified elsewhere without behavioral disturbance: Secondary | ICD-10-CM | POA: Diagnosis not present

## 2014-02-15 DIAGNOSIS — F329 Major depressive disorder, single episode, unspecified: Secondary | ICD-10-CM | POA: Diagnosis not present

## 2014-02-15 DIAGNOSIS — R2689 Other abnormalities of gait and mobility: Secondary | ICD-10-CM | POA: Diagnosis not present

## 2014-02-15 DIAGNOSIS — G309 Alzheimer's disease, unspecified: Secondary | ICD-10-CM | POA: Diagnosis not present

## 2014-02-15 DIAGNOSIS — M810 Age-related osteoporosis without current pathological fracture: Secondary | ICD-10-CM | POA: Diagnosis not present

## 2014-02-21 DIAGNOSIS — G309 Alzheimer's disease, unspecified: Secondary | ICD-10-CM | POA: Diagnosis not present

## 2014-02-21 DIAGNOSIS — M810 Age-related osteoporosis without current pathological fracture: Secondary | ICD-10-CM | POA: Diagnosis not present

## 2014-02-21 DIAGNOSIS — Z9181 History of falling: Secondary | ICD-10-CM | POA: Diagnosis not present

## 2014-02-21 DIAGNOSIS — F0280 Dementia in other diseases classified elsewhere without behavioral disturbance: Secondary | ICD-10-CM | POA: Diagnosis not present

## 2014-02-21 DIAGNOSIS — R2689 Other abnormalities of gait and mobility: Secondary | ICD-10-CM | POA: Diagnosis not present

## 2014-02-21 DIAGNOSIS — F329 Major depressive disorder, single episode, unspecified: Secondary | ICD-10-CM | POA: Diagnosis not present

## 2014-02-22 DIAGNOSIS — F0280 Dementia in other diseases classified elsewhere without behavioral disturbance: Secondary | ICD-10-CM | POA: Diagnosis not present

## 2014-02-22 DIAGNOSIS — F329 Major depressive disorder, single episode, unspecified: Secondary | ICD-10-CM | POA: Diagnosis not present

## 2014-02-22 DIAGNOSIS — Z9181 History of falling: Secondary | ICD-10-CM | POA: Diagnosis not present

## 2014-02-22 DIAGNOSIS — M810 Age-related osteoporosis without current pathological fracture: Secondary | ICD-10-CM | POA: Diagnosis not present

## 2014-02-22 DIAGNOSIS — G309 Alzheimer's disease, unspecified: Secondary | ICD-10-CM | POA: Diagnosis not present

## 2014-02-22 DIAGNOSIS — R2689 Other abnormalities of gait and mobility: Secondary | ICD-10-CM | POA: Diagnosis not present

## 2014-02-28 DIAGNOSIS — Z9181 History of falling: Secondary | ICD-10-CM | POA: Diagnosis not present

## 2014-02-28 DIAGNOSIS — F0280 Dementia in other diseases classified elsewhere without behavioral disturbance: Secondary | ICD-10-CM | POA: Diagnosis not present

## 2014-02-28 DIAGNOSIS — F329 Major depressive disorder, single episode, unspecified: Secondary | ICD-10-CM | POA: Diagnosis not present

## 2014-02-28 DIAGNOSIS — M810 Age-related osteoporosis without current pathological fracture: Secondary | ICD-10-CM | POA: Diagnosis not present

## 2014-02-28 DIAGNOSIS — R2689 Other abnormalities of gait and mobility: Secondary | ICD-10-CM | POA: Diagnosis not present

## 2014-02-28 DIAGNOSIS — G309 Alzheimer's disease, unspecified: Secondary | ICD-10-CM | POA: Diagnosis not present

## 2014-03-01 DIAGNOSIS — R2689 Other abnormalities of gait and mobility: Secondary | ICD-10-CM | POA: Diagnosis not present

## 2014-03-01 DIAGNOSIS — M810 Age-related osteoporosis without current pathological fracture: Secondary | ICD-10-CM | POA: Diagnosis not present

## 2014-03-01 DIAGNOSIS — F329 Major depressive disorder, single episode, unspecified: Secondary | ICD-10-CM | POA: Diagnosis not present

## 2014-03-01 DIAGNOSIS — G309 Alzheimer's disease, unspecified: Secondary | ICD-10-CM | POA: Diagnosis not present

## 2014-03-01 DIAGNOSIS — F0280 Dementia in other diseases classified elsewhere without behavioral disturbance: Secondary | ICD-10-CM | POA: Diagnosis not present

## 2014-03-01 DIAGNOSIS — Z9181 History of falling: Secondary | ICD-10-CM | POA: Diagnosis not present

## 2014-03-06 ENCOUNTER — Other Ambulatory Visit: Payer: Self-pay | Admitting: Internal Medicine

## 2014-03-07 DIAGNOSIS — F0280 Dementia in other diseases classified elsewhere without behavioral disturbance: Secondary | ICD-10-CM | POA: Diagnosis not present

## 2014-03-07 DIAGNOSIS — M810 Age-related osteoporosis without current pathological fracture: Secondary | ICD-10-CM | POA: Diagnosis not present

## 2014-03-07 DIAGNOSIS — F329 Major depressive disorder, single episode, unspecified: Secondary | ICD-10-CM | POA: Diagnosis not present

## 2014-03-07 DIAGNOSIS — Z9181 History of falling: Secondary | ICD-10-CM | POA: Diagnosis not present

## 2014-03-07 DIAGNOSIS — R2689 Other abnormalities of gait and mobility: Secondary | ICD-10-CM | POA: Diagnosis not present

## 2014-03-07 DIAGNOSIS — G309 Alzheimer's disease, unspecified: Secondary | ICD-10-CM | POA: Diagnosis not present

## 2014-03-09 DIAGNOSIS — G309 Alzheimer's disease, unspecified: Secondary | ICD-10-CM | POA: Diagnosis not present

## 2014-03-09 DIAGNOSIS — F0280 Dementia in other diseases classified elsewhere without behavioral disturbance: Secondary | ICD-10-CM | POA: Diagnosis not present

## 2014-03-09 DIAGNOSIS — R2689 Other abnormalities of gait and mobility: Secondary | ICD-10-CM | POA: Diagnosis not present

## 2014-03-09 DIAGNOSIS — M810 Age-related osteoporosis without current pathological fracture: Secondary | ICD-10-CM | POA: Diagnosis not present

## 2014-03-09 DIAGNOSIS — F329 Major depressive disorder, single episode, unspecified: Secondary | ICD-10-CM | POA: Diagnosis not present

## 2014-03-09 DIAGNOSIS — Z9181 History of falling: Secondary | ICD-10-CM | POA: Diagnosis not present

## 2014-03-13 DIAGNOSIS — R2689 Other abnormalities of gait and mobility: Secondary | ICD-10-CM | POA: Diagnosis not present

## 2014-03-13 DIAGNOSIS — M810 Age-related osteoporosis without current pathological fracture: Secondary | ICD-10-CM | POA: Diagnosis not present

## 2014-03-13 DIAGNOSIS — F0280 Dementia in other diseases classified elsewhere without behavioral disturbance: Secondary | ICD-10-CM | POA: Diagnosis not present

## 2014-03-13 DIAGNOSIS — G309 Alzheimer's disease, unspecified: Secondary | ICD-10-CM | POA: Diagnosis not present

## 2014-03-13 DIAGNOSIS — Z9181 History of falling: Secondary | ICD-10-CM | POA: Diagnosis not present

## 2014-03-13 DIAGNOSIS — F329 Major depressive disorder, single episode, unspecified: Secondary | ICD-10-CM | POA: Diagnosis not present

## 2014-03-15 DIAGNOSIS — Z9181 History of falling: Secondary | ICD-10-CM | POA: Diagnosis not present

## 2014-03-15 DIAGNOSIS — R2689 Other abnormalities of gait and mobility: Secondary | ICD-10-CM | POA: Diagnosis not present

## 2014-03-15 DIAGNOSIS — F329 Major depressive disorder, single episode, unspecified: Secondary | ICD-10-CM | POA: Diagnosis not present

## 2014-03-15 DIAGNOSIS — G309 Alzheimer's disease, unspecified: Secondary | ICD-10-CM | POA: Diagnosis not present

## 2014-03-15 DIAGNOSIS — F0280 Dementia in other diseases classified elsewhere without behavioral disturbance: Secondary | ICD-10-CM | POA: Diagnosis not present

## 2014-03-15 DIAGNOSIS — M810 Age-related osteoporosis without current pathological fracture: Secondary | ICD-10-CM | POA: Diagnosis not present

## 2014-03-20 DIAGNOSIS — R2689 Other abnormalities of gait and mobility: Secondary | ICD-10-CM | POA: Diagnosis not present

## 2014-03-20 DIAGNOSIS — Z9181 History of falling: Secondary | ICD-10-CM | POA: Diagnosis not present

## 2014-03-20 DIAGNOSIS — F0280 Dementia in other diseases classified elsewhere without behavioral disturbance: Secondary | ICD-10-CM | POA: Diagnosis not present

## 2014-03-20 DIAGNOSIS — F329 Major depressive disorder, single episode, unspecified: Secondary | ICD-10-CM | POA: Diagnosis not present

## 2014-03-20 DIAGNOSIS — M810 Age-related osteoporosis without current pathological fracture: Secondary | ICD-10-CM | POA: Diagnosis not present

## 2014-03-20 DIAGNOSIS — G309 Alzheimer's disease, unspecified: Secondary | ICD-10-CM | POA: Diagnosis not present

## 2014-03-21 DIAGNOSIS — R2689 Other abnormalities of gait and mobility: Secondary | ICD-10-CM | POA: Diagnosis not present

## 2014-03-21 DIAGNOSIS — Z9181 History of falling: Secondary | ICD-10-CM | POA: Diagnosis not present

## 2014-03-21 DIAGNOSIS — M810 Age-related osteoporosis without current pathological fracture: Secondary | ICD-10-CM | POA: Diagnosis not present

## 2014-03-21 DIAGNOSIS — G309 Alzheimer's disease, unspecified: Secondary | ICD-10-CM | POA: Diagnosis not present

## 2014-03-21 DIAGNOSIS — F0280 Dementia in other diseases classified elsewhere without behavioral disturbance: Secondary | ICD-10-CM | POA: Diagnosis not present

## 2014-03-21 DIAGNOSIS — F329 Major depressive disorder, single episode, unspecified: Secondary | ICD-10-CM | POA: Diagnosis not present

## 2014-03-27 DIAGNOSIS — G309 Alzheimer's disease, unspecified: Secondary | ICD-10-CM | POA: Diagnosis not present

## 2014-03-27 DIAGNOSIS — Z9181 History of falling: Secondary | ICD-10-CM | POA: Diagnosis not present

## 2014-03-27 DIAGNOSIS — F329 Major depressive disorder, single episode, unspecified: Secondary | ICD-10-CM | POA: Diagnosis not present

## 2014-03-27 DIAGNOSIS — M810 Age-related osteoporosis without current pathological fracture: Secondary | ICD-10-CM | POA: Diagnosis not present

## 2014-03-27 DIAGNOSIS — R2689 Other abnormalities of gait and mobility: Secondary | ICD-10-CM | POA: Diagnosis not present

## 2014-03-27 DIAGNOSIS — F0280 Dementia in other diseases classified elsewhere without behavioral disturbance: Secondary | ICD-10-CM | POA: Diagnosis not present

## 2014-03-28 DIAGNOSIS — G309 Alzheimer's disease, unspecified: Secondary | ICD-10-CM | POA: Diagnosis not present

## 2014-03-28 DIAGNOSIS — Z9181 History of falling: Secondary | ICD-10-CM | POA: Diagnosis not present

## 2014-03-28 DIAGNOSIS — R2689 Other abnormalities of gait and mobility: Secondary | ICD-10-CM | POA: Diagnosis not present

## 2014-03-28 DIAGNOSIS — F329 Major depressive disorder, single episode, unspecified: Secondary | ICD-10-CM | POA: Diagnosis not present

## 2014-03-28 DIAGNOSIS — M810 Age-related osteoporosis without current pathological fracture: Secondary | ICD-10-CM | POA: Diagnosis not present

## 2014-03-28 DIAGNOSIS — F0280 Dementia in other diseases classified elsewhere without behavioral disturbance: Secondary | ICD-10-CM | POA: Diagnosis not present

## 2014-04-04 ENCOUNTER — Other Ambulatory Visit: Payer: Self-pay | Admitting: Internal Medicine

## 2014-04-04 DIAGNOSIS — R2689 Other abnormalities of gait and mobility: Secondary | ICD-10-CM | POA: Diagnosis not present

## 2014-04-04 DIAGNOSIS — F329 Major depressive disorder, single episode, unspecified: Secondary | ICD-10-CM | POA: Diagnosis not present

## 2014-04-04 DIAGNOSIS — G309 Alzheimer's disease, unspecified: Secondary | ICD-10-CM | POA: Diagnosis not present

## 2014-04-04 DIAGNOSIS — F0280 Dementia in other diseases classified elsewhere without behavioral disturbance: Secondary | ICD-10-CM | POA: Diagnosis not present

## 2014-04-04 DIAGNOSIS — M810 Age-related osteoporosis without current pathological fracture: Secondary | ICD-10-CM | POA: Diagnosis not present

## 2014-04-04 DIAGNOSIS — Z9181 History of falling: Secondary | ICD-10-CM | POA: Diagnosis not present

## 2014-04-04 NOTE — Telephone Encounter (Signed)
Refill sent.

## 2014-04-04 NOTE — Telephone Encounter (Signed)
Ok to refill, refill sent  

## 2014-04-04 NOTE — Telephone Encounter (Signed)
Last OV 11.24.15.  Please advise refill

## 2014-04-20 ENCOUNTER — Encounter: Payer: Self-pay | Admitting: Internal Medicine

## 2014-04-23 ENCOUNTER — Telehealth: Payer: Self-pay | Admitting: Internal Medicine

## 2014-04-23 MED ORDER — OMEPRAZOLE-SODIUM BICARBONATE 40-1100 MG PO CAPS: 1.0000 | ORAL_CAPSULE | Freq: Every day | ORAL | Status: DC

## 2014-04-23 NOTE — Telephone Encounter (Signed)
Catherine MessierKathy,,  See e mail from son Catherine Meyers NovemberMike.  My response below to him needs to be sent to the facility director , Junius CreamerKaren Meyers.    [name]'s symptoms may be due to gastritis, which can be treated with a PPI (Nexium, Prilosec, etc) and complete avoidance of NSAIDs (Advil, meloxicam,  Motrin,  Aleve).  If she is taking any of the latter,  Please stop them immediately, and start the omeprazole rx   Have sent  send to [facility].  Please contact Junius CreamerKaren Meyers at the FACILITY with the same information and  find out i she has had weight loss,  Trouble with constipation, etc. .  Please make an appt for your mother to see me in 2 weeks to see if the symptoms have improved.   Regards,  [name]

## 2014-04-24 ENCOUNTER — Telehealth: Payer: Self-pay

## 2014-04-24 ENCOUNTER — Encounter: Payer: Self-pay | Admitting: *Deleted

## 2014-04-24 MED ORDER — OMEPRAZOLE 40 MG PO CPDR: 40.0000 mg | DELAYED_RELEASE_CAPSULE | Freq: Every day | ORAL | Status: DC

## 2014-04-24 NOTE — Telephone Encounter (Signed)
Generic omeprazole 40 mg sent instead

## 2014-04-24 NOTE — Telephone Encounter (Signed)
Received fax from [facility] stating " insurance will not cover omeprazole- sodium bicarbonate. Cost is prohibitive. Please D/C and advise of new medication."

## 2014-04-24 NOTE — Telephone Encounter (Signed)
Letter written and ready to fax t facility have called facility and given verbal.

## 2014-04-24 NOTE — Telephone Encounter (Signed)
Faxed to facility.

## 2014-05-08 ENCOUNTER — Other Ambulatory Visit: Payer: Self-pay | Admitting: Internal Medicine

## 2014-05-18 ENCOUNTER — Telehealth: Payer: Self-pay | Admitting: *Deleted

## 2014-05-18 NOTE — Telephone Encounter (Signed)
Catherine BraunKaren called states pt is having leg pain and difficulty walking.  Further states pt was told to discontinue the Meloxicam on 1.29.16.  Please advise

## 2014-05-19 ENCOUNTER — Other Ambulatory Visit: Payer: Self-pay | Admitting: *Deleted

## 2014-05-19 MED ORDER — ACETAMINOPHEN 500 MG PO TABS: 1000.0000 mg | ORAL_TABLET | Freq: Two times a day (BID) | ORAL | Status: DC | PRN

## 2014-05-19 NOTE — Telephone Encounter (Signed)
Rx printed

## 2014-05-19 NOTE — Telephone Encounter (Signed)
Per discussion, pt not taking anything for pain.  Would try tylenol extra strength 2 tablet bid prn.  Let us know if persistent problems.

## 2014-05-22 ENCOUNTER — Other Ambulatory Visit: Payer: Self-pay | Admitting: Internal Medicine

## 2014-05-22 NOTE — Telephone Encounter (Signed)
Refill sent.

## 2014-06-06 ENCOUNTER — Telehealth: Payer: Self-pay | Admitting: Internal Medicine

## 2014-06-06 MED ORDER — ACETAMINOPHEN 500 MG PO TABS: 1000.0000 mg | ORAL_TABLET | Freq: Two times a day (BID) | ORAL | Status: DC | PRN

## 2014-06-06 NOTE — Telephone Encounter (Signed)
Need script for PRN tylenol signed so  Patient can receive in extended care facility. Printed for signature to fax to facility.

## 2014-06-19 ENCOUNTER — Other Ambulatory Visit: Payer: Self-pay | Admitting: Internal Medicine

## 2014-06-19 NOTE — Telephone Encounter (Signed)
Last OV 11.4.15, last refill 3.8.16.  Please advise refill

## 2014-06-27 ENCOUNTER — Other Ambulatory Visit: Payer: Self-pay | Admitting: Internal Medicine

## 2014-07-04 ENCOUNTER — Other Ambulatory Visit: Payer: Self-pay | Admitting: Internal Medicine

## 2014-07-04 NOTE — Telephone Encounter (Signed)
Last OV 11.4.15.  Please advise refill 

## 2014-07-05 NOTE — Telephone Encounter (Signed)
Ok to refill,  Authorized in epic and sent  

## 2014-08-17 ENCOUNTER — Ambulatory Visit (INDEPENDENT_AMBULATORY_CARE_PROVIDER_SITE_OTHER): Payer: Medicare Other | Admitting: Nurse Practitioner

## 2014-08-17 ENCOUNTER — Encounter: Payer: Self-pay | Admitting: Nurse Practitioner

## 2014-08-17 VITALS — BP 110/60 | HR 76 | Temp 97.7°F | Resp 14 | Ht 59.5 in | Wt 147.1 lb

## 2014-08-17 DIAGNOSIS — R399 Unspecified symptoms and signs involving the genitourinary system: Secondary | ICD-10-CM | POA: Diagnosis not present

## 2014-08-17 DIAGNOSIS — R3989 Other symptoms and signs involving the genitourinary system: Secondary | ICD-10-CM | POA: Diagnosis not present

## 2014-08-17 LAB — RENAL FUNCTION PANEL
ALBUMIN: 4.1 g/dL (ref 3.5–5.2)
BUN: 20 mg/dL (ref 6–23)
CALCIUM: 9.5 mg/dL (ref 8.4–10.5)
CHLORIDE: 103 meq/L (ref 96–112)
CO2: 30 meq/L (ref 19–32)
CREATININE: 1.03 mg/dL (ref 0.40–1.20)
GFR: 54.18 mL/min — ABNORMAL LOW (ref >60.00)
GLUCOSE: 196 mg/dL — AB (ref 70–99)
Phosphorus: 3.2 mg/dL (ref 2.3–4.6)
Potassium: 4.1 mEq/L (ref 3.5–5.1)
Sodium: 139 mEq/L (ref 135–145)

## 2014-08-17 LAB — POCT URINALYSIS DIPSTICK
Bilirubin, UA: NEGATIVE
GLUCOSE UA: NEGATIVE
Ketones, UA: NEGATIVE
NITRITE UA: NEGATIVE
Protein, UA: NEGATIVE
SPEC GRAV UA: 1.025
UROBILINOGEN UA: 0.2
pH, UA: 5.5

## 2014-08-17 MED ORDER — SULFAMETHOXAZOLE-TRIMETHOPRIM 800-160 MG PO TABS: 1.0000 | ORAL_TABLET | Freq: Two times a day (BID) | ORAL | Status: DC

## 2014-08-17 NOTE — Progress Notes (Signed)
Pre visit review using our clinic review tool, if applicable. No additional management support is needed unless otherwise documented below in the visit note. 

## 2014-08-17 NOTE — Progress Notes (Signed)
   Subjective:    Patient ID: Catherine Meyers, female    DOB: 08/28/1927, 81 y.o.   MRN: [id number]  HPI  Catherine Meyers is a 81 yo female with 2 days of confusion and irritability x 2 days.    1) Irritability and confusion still occuring. Thinking she has a UTI and came today. Happens at night mostly, happened yesterday. There are no "UTI" symptoms. Denies dysuria, odor, cloudiness ect...  Review of Systems  Constitutional: Negative for fever, chills, diaphoresis and fatigue.  Respiratory: Negative for chest tightness, shortness of breath and wheezing.   Cardiovascular: Negative for chest pain, palpitations and leg swelling.  Gastrointestinal: Negative for nausea, vomiting and diarrhea.  Genitourinary: Negative for dysuria, urgency, frequency, hematuria, decreased urine volume and difficulty urinating.  Psychiatric/Behavioral: Positive for confusion.      Objective:   Physical Exam  Constitutional: She is oriented to person, place, and time. She appears well-developed and well-nourished. No distress.  BP 110/60 mmHg  Pulse 76  Temp(Src) 97.7 F (36.5 C) (Oral)  Resp 14  Ht 4' 11.5" (1.511 m)  Wt 147 lb 1.9 oz (66.733 kg)  BMI 29.23 kg/m2  SpO2 97%   HENT:  Head: Normocephalic and atraumatic.  Right Ear: External ear normal.  Left Ear: External ear normal.  Cardiovascular: Normal rate, regular rhythm, normal heart sounds and intact distal pulses.  Exam reveals no gallop and no friction rub.   No murmur heard. Pulmonary/Chest: Effort normal and breath sounds normal. No respiratory distress. She has no wheezes. She has no rales. She exhibits no tenderness.  Neurological: She is alert and oriented to person, place, and time. No cranial nerve deficit. She exhibits normal muscle tone. Coordination normal.  Skin: Skin is warm and dry. No rash noted. She is not diaphoretic.  Psychiatric: She has a normal mood and affect. Her behavior is normal. Judgment and thought content normal.        Assessment & Plan:

## 2014-08-17 NOTE — Patient Instructions (Addendum)
Septra-DS- take twice a day for 7 days.   Please visit the lab before leaving today.   We will contact you if any changes.

## 2014-08-20 LAB — URINE CULTURE

## 2014-08-31 ENCOUNTER — Ambulatory Visit (INDEPENDENT_AMBULATORY_CARE_PROVIDER_SITE_OTHER): Payer: Medicare Other | Admitting: Internal Medicine

## 2014-08-31 ENCOUNTER — Encounter: Payer: Self-pay | Admitting: Internal Medicine

## 2014-08-31 VITALS — BP 138/72 | HR 70 | Temp 97.5°F | Resp 16 | Ht 60.0 in | Wt 147.5 lb

## 2014-08-31 DIAGNOSIS — F329 Major depressive disorder, single episode, unspecified: Secondary | ICD-10-CM | POA: Diagnosis not present

## 2014-08-31 DIAGNOSIS — R635 Abnormal weight gain: Secondary | ICD-10-CM

## 2014-08-31 DIAGNOSIS — F32A Depression, unspecified: Secondary | ICD-10-CM

## 2014-08-31 DIAGNOSIS — F0151 Vascular dementia with behavioral disturbance: Secondary | ICD-10-CM

## 2014-08-31 DIAGNOSIS — E559 Vitamin D deficiency, unspecified: Secondary | ICD-10-CM | POA: Diagnosis not present

## 2014-08-31 DIAGNOSIS — F0153 Vascular dementia, unspecified severity, with mood disturbance: Secondary | ICD-10-CM

## 2014-08-31 LAB — VITAMIN D 25 HYDROXY (VIT D DEFICIENCY, FRACTURES): VITD: 47.37 ng/mL (ref 30.00–100.00)

## 2014-08-31 MED ORDER — LORAZEPAM 0.5 MG PO TABS: 0.5000 mg | ORAL_TABLET | Freq: Two times a day (BID) | ORAL | Status: DC | PRN

## 2014-08-31 NOTE — Progress Notes (Signed)
Subjective:  Patient ID: Catherine Meyers, female    DOB: Aug 14, 1927  Age: 81 y.o. MRN: [id number]  CC: The primary encounter diagnosis was Vitamin D deficiency. Diagnoses of Vascular dementia with depressed mood, Depression, and Weight gain were also pertinent to this visit.  HPI Catherine Meyers presents for follow up on dementia with new onset daytime and nighttime agitation.  Patient is now living in an assisted living facility called [facility] and is accompanied by a Public relations account executive.  According to the staff she is continually trying to leave the facility.   She has not been tolerating benadryl which was started when insurance would no longer pay for hydroxyzine   Her stomach issues have improved .     Outpatient Prescriptions Prior to Visit  Medication Sig Dispense Refill  . Cranberry Fruit 405 MG CAPS TAKE 1 CAPSULE BY MOUTH TWICE DAILY FOR URINARY HEALTH 60 each 4  . diphenhydrAMINE (BENADRYL) 25 mg capsule TAKE 1 CAPSULE BY MOUTH AT BEDTIME FOR COUGH 90 capsule 1  . DiphenhydrAMINE HCl, Sleep, 25 MG CAPS 1 tablet daily at bedtime for cough 30 capsule 2  . MAPAP 500 MG tablet TAKE 2 TABLETS (1000 MG) BY MOUTH TWICE DAILY AS NEEDED FOR DISCOMFORT OR FEVER 60 tablet 11  . omeprazole (PRILOSEC) 40 MG capsule Take 1 capsule (40 mg total) by mouth daily. 90 capsule 1  . QUEtiapine (SEROQUEL) 50 MG tablet TAKE 1 TABLET BY MOUTH 1 HOUR BEFORE BEDTIME DAILY 30 tablet 8  . sulfamethoxazole-trimethoprim (BACTRIM DS,SEPTRA DS) 800-160 MG per tablet Take 1 tablet by mouth 2 (two) times daily. 14 tablet 0  . Vitamin D, Ergocalciferol, (DRISDOL) 50000 UNITS CAPS capsule TAKE 1 CAPSULE BY MOUTH ONCE A WEEK FOR SUPPLEMENT 12 capsule 2  . Tdap (BOOSTRIX) 5-2.5-18.5 LF-MCG/0.5 injection Inject 0.5 mLs into the muscle once. (Patient not taking: Reported on 08/31/2014) 0.5 mL 0  . zoster vaccine live, PF, (ZOSTAVAX) 47829 UNT/0.65ML injection Inject 19,400 Units into the skin once. (Patient not  taking: Reported on 08/31/2014) 1 each 0   No facility-administered medications prior to visit.    Review of Systems;  Patient denies headache, fevers, malaise, unintentional weight loss, skin rash, eye pain, sinus congestion and sinus pain, sore throat, dysphagia,  hemoptysis , cough, dyspnea, wheezing, chest pain, palpitations, orthopnea, edema, abdominal pain, nausea, melena, diarrhea, constipation, flank pain, dysuria, hematuria, urinary  Frequency, nocturia, numbness, tingling, seizures,  Focal weakness, Loss of consciousness,  Tremor, insomnia, depression, anxiety, and suicidal ideation.      Objective:  BP 138/72 mmHg  Pulse 70  Temp(Src) 97.5 F (36.4 C) (Oral)  Resp 16  Ht 5' (1.524 m)  Wt 147 lb 8 oz (66.906 kg)  BMI 28.81 kg/m2  SpO2 99%  BP Readings from Last 3 Encounters:  08/31/14 138/72  08/17/14 110/60  02/01/14 138/60    Wt Readings from Last 3 Encounters:  08/31/14 147 lb 8 oz (66.906 kg)  08/17/14 147 lb 1.9 oz (66.733 kg)  02/01/14 140 lb 12 oz (63.844 kg)    General appearance: alert, cooperative and appears stated age Ears: normal TM's and external ear canals both ears Throat: lips, mucosa, and tongue normal; teeth and gums normal Neck: no adenopathy, no carotid bruit, supple, symmetrical, trachea midline and thyroid not enlarged, symmetric, no tenderness/mass/nodules Back: symmetric, no curvature. ROM normal. No CVA tenderness. Lungs: clear to auscultation bilaterally Heart: regular rate and rhythm, S1, S2 normal, no murmur, click, rub or gallop Abdomen:  soft, non-tender; bowel sounds normal; no masses,  no organomegaly Pulses: 2+ and symmetric Skin: Skin color, texture, turgor normal. No rashes or lesions Lymph nodes: Cervical, supraclavicular, and axillary nodes normal.  No results found for: HGBA1C  Lab Results  Component Value Date   CREATININE 1.03 08/17/2014   CREATININE 0.9 11/01/2013   CREATININE 1.35* 2020-11-2313    Lab Results    Component Value Date   WBC 8.1 2020-11-2313   HGB 13.9 2020-11-2313   HCT 42.7 2020-11-2313   PLT 255 2020-11-2313   GLUCOSE 196* 08/17/2014   CHOL 176 05/04/2012   TRIG 95.0 05/04/2012   HDL 55.70 05/04/2012   LDLCALC 101* 05/04/2012   ALT 24 2020-11-2313   AST 31 2020-11-2313   NA 139 08/17/2014   K 4.1 08/17/2014   CL 103 08/17/2014   CREATININE 1.03 08/17/2014   BUN 20 08/17/2014   CO2 30 08/17/2014   TSH 3.17 05/31/2013    No results found.  Assessment & Plan:   Problem List Items Addressed This Visit    Depression    With nighttime agitation , managed with seroquel      Relevant Medications   LORazepam (ATIVAN) 0.5 MG tablet   Vascular dementia with depressed mood    Her nighttime agitation may be aggravated by Benadryl. We are stopping the benadryl and starting lorazepam for agitation      Relevant Medications   LORazepam (ATIVAN) 0.5 MG tablet   Weight gain    She has gained 7 lbs since her last visit .  Advised staff to limit her desserts to once daily .      Vitamin D deficiency - Primary   Relevant Orders   Vit D  25 hydroxy (rtn osteoporosis monitoring) (Completed)     A total of 25 minutes of face to face time was spent with patient more than half of which was spent in counselling and coordination of care   I am having Catherine Meyers start on LORazepam. I am also having her maintain her Tdap, DiphenhydrAMINE HCl (Sleep), zoster vaccine live (PF), QUEtiapine, omeprazole, Cranberry Fruit, MAPAP, Vitamin D (Ergocalciferol), diphenhydrAMINE, and sulfamethoxazole-trimethoprim.  Meds ordered this encounter  Medications  . LORazepam (ATIVAN) 0.5 MG tablet    Sig: Take 1 tablet (0.5 mg total) by mouth 2 (two) times daily as needed for anxiety.    Dispense:  60 tablet    Refill:  3    There are no discontinued medications.  Follow-up: No Follow-up on file.   Sherlene ShamsULLO, Kinzee Happel L, MD

## 2014-08-31 NOTE — Patient Instructions (Signed)
I have stopped the benadryl; it may be aggravating the behavior issues  I am starting lorazepam 0.5 mg twice daiy to help calm her down  The seroquel can be continued if needed at bedtime for continued agitation

## 2014-08-31 NOTE — Progress Notes (Signed)
Pre-visit discussion using our clinic review tool. No additional management support is needed unless otherwise documented below in the visit note.  

## 2014-09-01 ENCOUNTER — Encounter: Payer: Self-pay | Admitting: *Deleted

## 2014-09-02 DIAGNOSIS — R399 Unspecified symptoms and signs involving the genitourinary system: Secondary | ICD-10-CM | POA: Insufficient documentation

## 2014-09-02 DIAGNOSIS — R635 Abnormal weight gain: Secondary | ICD-10-CM | POA: Insufficient documentation

## 2014-09-02 NOTE — Assessment & Plan Note (Signed)
With nighttime agitation , managed with seroquel

## 2014-09-02 NOTE — Assessment & Plan Note (Signed)
POCT urine- start on Bactrim DS 7 days (twice daily). Obtain Urine culture and renal function test today. Will follow.

## 2014-09-02 NOTE — Assessment & Plan Note (Signed)
She has gained 7 lbs since her last visit .  Advised staff to limit her desserts to once daily .

## 2014-09-02 NOTE — Assessment & Plan Note (Signed)
Her nighttime agitation may be aggravated by Benadryl. We are stopping the benadryl and starting lorazepam for agitation

## 2014-09-14 ENCOUNTER — Emergency Department: Payer: Medicare Other

## 2014-09-14 ENCOUNTER — Encounter: Payer: Self-pay | Admitting: Emergency Medicine

## 2014-09-14 ENCOUNTER — Emergency Department
Admission: EM | Admit: 2014-09-14 | Discharge: 2014-09-14 | Disposition: A | Discharge status: Home / Self Care | Payer: Medicare Other | Attending: Emergency Medicine | Admitting: Emergency Medicine

## 2014-09-14 DIAGNOSIS — S0990XA Unspecified injury of head, initial encounter: Secondary | ICD-10-CM | POA: Diagnosis present

## 2014-09-14 DIAGNOSIS — Y998 Other external cause status: Secondary | ICD-10-CM | POA: Diagnosis not present

## 2014-09-14 DIAGNOSIS — Y9389 Activity, other specified: Secondary | ICD-10-CM | POA: Insufficient documentation

## 2014-09-14 DIAGNOSIS — S3993XA Unspecified injury of pelvis, initial encounter: Secondary | ICD-10-CM | POA: Diagnosis not present

## 2014-09-14 DIAGNOSIS — S0101XA Laceration without foreign body of scalp, initial encounter: Secondary | ICD-10-CM | POA: Diagnosis not present

## 2014-09-14 DIAGNOSIS — Y9289 Other specified places as the place of occurrence of the external cause: Secondary | ICD-10-CM | POA: Diagnosis not present

## 2014-09-14 DIAGNOSIS — W19XXXA Unspecified fall, initial encounter: Secondary | ICD-10-CM

## 2014-09-14 DIAGNOSIS — S8991XA Unspecified injury of right lower leg, initial encounter: Secondary | ICD-10-CM | POA: Diagnosis not present

## 2014-09-14 DIAGNOSIS — R51 Headache: Secondary | ICD-10-CM | POA: Diagnosis not present

## 2014-09-14 DIAGNOSIS — M1712 Unilateral primary osteoarthritis, left knee: Secondary | ICD-10-CM | POA: Diagnosis not present

## 2014-09-14 DIAGNOSIS — W010XXA Fall on same level from slipping, tripping and stumbling without subsequent striking against object, initial encounter: Secondary | ICD-10-CM | POA: Diagnosis not present

## 2014-09-14 DIAGNOSIS — F0391 Unspecified dementia with behavioral disturbance: Secondary | ICD-10-CM | POA: Insufficient documentation

## 2014-09-14 DIAGNOSIS — F03918 Unspecified dementia, unspecified severity, with other behavioral disturbance: Secondary | ICD-10-CM | POA: Insufficient documentation

## 2014-09-14 DIAGNOSIS — Z79899 Other long term (current) drug therapy: Secondary | ICD-10-CM | POA: Diagnosis not present

## 2014-09-14 DIAGNOSIS — F0390 Unspecified dementia without behavioral disturbance: Secondary | ICD-10-CM | POA: Insufficient documentation

## 2014-09-14 DIAGNOSIS — M8580 Other specified disorders of bone density and structure, unspecified site: Secondary | ICD-10-CM | POA: Insufficient documentation

## 2014-09-14 DIAGNOSIS — E559 Vitamin D deficiency, unspecified: Secondary | ICD-10-CM | POA: Insufficient documentation

## 2014-09-14 DIAGNOSIS — S79921A Unspecified injury of right thigh, initial encounter: Secondary | ICD-10-CM | POA: Diagnosis not present

## 2014-09-14 HISTORY — DX: Other specified disorders of bone density and structure, unspecified site: M85.80

## 2014-09-14 HISTORY — DX: Unspecified dementia, unspecified severity, without behavioral disturbance, psychotic disturbance, mood disturbance, and anxiety: F03.90

## 2014-09-14 HISTORY — DX: Vitamin D deficiency, unspecified: E55.9

## 2014-09-14 MED ORDER — LIDOCAINE-EPINEPHRINE (PF) 1 %-1:200000 IJ SOLN
INTRAMUSCULAR | Status: AC
  Filled 2014-09-14: qty 30

## 2014-09-14 MED ORDER — ACETAMINOPHEN 500 MG PO TABS
1000.0000 mg | ORAL_TABLET | ORAL | Status: AC
  Administered 2014-09-14: 1000 mg via ORAL

## 2014-09-14 MED ORDER — ACETAMINOPHEN 500 MG PO TABS
ORAL_TABLET | ORAL | Status: AC
  Administered 2014-09-14: 1000 mg via ORAL
  Filled 2014-09-14: qty 2

## 2014-09-14 NOTE — ED Notes (Signed)
Pt walkd in room with walker, no problems, lunch served

## 2014-09-14 NOTE — Discharge Instructions (Signed)
Fall Prevention and Home Safety  You have a total of 5 staples which will need removal in about 10 days. You may follow-up in the ER or with your doctor for removal. Return to the ER right away if he develop a severe headache, numbness, weakness, are unable to walk, or other new concerns arise.  Falls cause injuries and can affect all age groups. It is possible to use preventive measures to significantly decrease the likelihood of falls. There are many simple measures which can make your home safer and prevent falls. OUTDOORS  Repair cracks and edges of walkways and driveways.  Remove high doorway thresholds.  Trim shrubbery on the main path into your home.  Have good outside lighting.  Clear walkways of tools, rocks, debris, and clutter.  Check that handrails are not broken and are securely fastened. Both sides of steps should have handrails.  Have leaves, snow, and ice cleared regularly.  Use sand or salt on walkways during winter months.  In the garage, clean up grease or oil spills. BATHROOM  Install night lights.  Install grab bars by the toilet and in the tub and shower.  Use non-skid mats or decals in the tub or shower.  Place a plastic non-slip stool in the shower to sit on, if needed.  Keep floors dry and clean up all water on the floor immediately.  Remove soap buildup in the tub or shower on a regular basis.  Secure bath mats with non-slip, double-sided rug tape.  Remove throw rugs and tripping hazards from the floors. BEDROOMS  Install night lights.  Make sure a bedside light is easy to reach.  Do not use oversized bedding.  Keep a telephone by your bedside.  Have a firm chair with side arms to use for getting dressed.  Remove throw rugs and tripping hazards from the floor. KITCHEN  Keep handles on pots and pans turned toward the center of the stove. Use back burners when possible.  Clean up spills quickly and allow time for drying.  Avoid  walking on wet floors.  Avoid hot utensils and knives.  Position shelves so they are not too high or low.  Place commonly used objects within easy reach.  If necessary, use a sturdy step stool with a grab bar when reaching.  Keep electrical cables out of the way.  Do not use floor polish or wax that makes floors slippery. If you must use wax, use non-skid floor wax.  Remove throw rugs and tripping hazards from the floor. STAIRWAYS  Never leave objects on stairs.  Place handrails on both sides of stairways and use them. Fix any loose handrails. Make sure handrails on both sides of the stairways are as long as the stairs.  Check carpeting to make sure it is firmly attached along stairs. Make repairs to worn or loose carpet promptly.  Avoid placing throw rugs at the top or bottom of stairways, or properly secure the rug with carpet tape to prevent slippage. Get rid of throw rugs, if possible.  Have an electrician put in a light switch at the top and bottom of the stairs. OTHER FALL PREVENTION TIPS  Wear low-heel or rubber-soled shoes that are supportive and fit well. Wear closed toe shoes.  When using a stepladder, make sure it is fully opened and both spreaders are firmly locked. Do not climb a closed stepladder.  Add color or contrast paint or tape to grab bars and handrails in your home. Place contrasting color strips  on first and last steps.  Learn and use mobility aids as needed. Install an electrical emergency response system.  Turn on lights to avoid dark areas. Replace light bulbs that burn out immediately. Get light switches that glow.  Arrange furniture to create clear pathways. Keep furniture in the same place.  Firmly attach carpet with non-skid or double-sided tape.  Eliminate uneven floor surfaces.  Select a carpet pattern that does not visually hide the edge of steps.  Be aware of all pets. OTHER HOME SAFETY TIPS  Set the water temperature for 120 F  (48.8 C).  Keep emergency numbers on or near the telephone.  Keep smoke detectors on every level of the home and near sleeping areas. Document Released: 03/07/2002 Document Revised: 09/16/2011 Document Reviewed: 06/06/2011 [facility] Patient Information 2015 [place], [place]. This information is not intended to replace advice given to you by your health care provider. Make sure you discuss any questions you have with your health care provider.  Laceration Care, Adult A laceration is a cut or lesion that goes through all layers of the skin and into the tissue just beneath the skin. TREATMENT  Some lacerations may not require closure. Some lacerations may not be able to be closed due to an increased risk of infection. It is important to see your caregiver as soon as possible after an injury to minimize the risk of infection and maximize the opportunity for successful closure. If closure is appropriate, pain medicines may be given, if needed. The wound will be cleaned to help prevent infection. Your caregiver will use stitches (sutures), staples, wound glue (adhesive), or skin adhesive strips to repair the laceration. These tools bring the skin edges together to allow for faster healing and a better cosmetic outcome. However, all wounds will heal with a scar. Once the wound has healed, scarring can be minimized by covering the wound with sunscreen during the day for 1 full year. HOME CARE INSTRUCTIONS  For sutures or staples:  Keep the wound clean and dry.  If you were given a bandage (dressing), you should change it at least once a day. Also, change the dressing if it becomes wet or dirty, or as directed by your caregiver.  Wash the wound with soap and water 2 times a day. Rinse the wound off with water to remove all soap. Pat the wound dry with a clean towel.  After cleaning, apply a thin layer of the antibiotic ointment as recommended by your caregiver. This will help prevent infection and keep  the dressing from sticking.  You may shower as usual after the first 24 hours. Do not soak the wound in water until the sutures are removed.  Only take over-the-counter or prescription medicines for pain, discomfort, or fever as directed by your caregiver.  Get your sutures or staples removed as directed by your caregiver. For skin adhesive strips:  Keep the wound clean and dry.  Do not get the skin adhesive strips wet. You may bathe carefully, using caution to keep the wound dry.  If the wound gets wet, pat it dry with a clean towel.  Skin adhesive strips will fall off on their own. You may trim the strips as the wound heals. Do not remove skin adhesive strips that are still stuck to the wound. They will fall off in time. For wound adhesive:  You may briefly wet your wound in the shower or bath. Do not soak or scrub the wound. Do not swim. Avoid periods of heavy  perspiration until the skin adhesive has fallen off on its own. After showering or bathing, gently pat the wound dry with a clean towel.  Do not apply liquid medicine, cream medicine, or ointment medicine to your wound while the skin adhesive is in place. This may loosen the film before your wound is healed.  If a dressing is placed over the wound, be careful not to apply tape directly over the skin adhesive. This may cause the adhesive to be pulled off before the wound is healed.  Avoid prolonged exposure to sunlight or tanning lamps while the skin adhesive is in place. Exposure to ultraviolet light in the first year will darken the scar.  The skin adhesive will usually remain in place for 5 to 10 days, then naturally fall off the skin. Do not pick at the adhesive film. You may need a tetanus shot if:  You cannot remember when you had your last tetanus shot.  You have never had a tetanus shot. If you get a tetanus shot, your arm may swell, get red, and feel warm to the touch. This is common and not a problem. If you need a  tetanus shot and you choose not to have one, there is a rare chance of getting tetanus. Sickness from tetanus can be serious. SEEK MEDICAL CARE IF:   You have redness, swelling, or increasing pain in the wound.  You see a red line that goes away from the wound.  You have yellowish-white fluid (pus) coming from the wound.  You have a fever.  You notice a bad smell coming from the wound or dressing.  Your wound breaks open before or after sutures have been removed.  You notice something coming out of the wound such as wood or glass.  Your wound is on your hand or foot and you cannot move a finger or toe. SEEK IMMEDIATE MEDICAL CARE IF:   Your pain is not controlled with prescribed medicine.  You have severe swelling around the wound causing pain and numbness or a change in color in your arm, hand, leg, or foot.  Your wound splits open and starts bleeding.  You have worsening numbness, weakness, or loss of function of any joint around or beyond the wound.  You develop painful lumps near the wound or on the skin anywhere on your body. MAKE SURE YOU:   Understand these instructions.  Will watch your condition.  Will get help right away if you are not doing well or get worse. Document Released: 03/17/2005 Document Revised: 06/09/2011 Document Reviewed: 09/10/2010 [facility] Patient Information 2015 [place], [place]. This information is not intended to replace advice given to you by your health care provider. Make sure you discuss any questions you have with your health care provider.

## 2014-09-14 NOTE — ED Notes (Signed)
Patient transported to CT 

## 2014-09-14 NOTE — ED Notes (Signed)
Pt tripped and fell in bathroom this am, caregiver denies loc, pt has 2 lacs noted to right side of head, bleeding controlled at this time, does not take blood thinner.

## 2014-09-14 NOTE — ED Provider Notes (Addendum)
[facility] Emergency Department Provider Note  ____________________________________________  Time seen: Approximately 1:00 PM  I have reviewed the triage vital signs and the nursing notes.   HISTORY  Chief Complaint Fall and Head Laceration    HPI Catherine Meyers is a 81 y.o. female who is with her care giver this morning when she slipped. She fell backward and did not lose consciousness. They noted that she was bleeding from the back of his scalp, and that she has 2 cuts on the side of the head. They went to [facility] clinic who sent them to the ER for further evaluation.  The patient denies any preceding symptoms, she did not pass out, she is not a headache, no numbness or tingling, no neck pain. She's been acting normally.  Tetanus shot is up-to-date   Past Medical History  Diagnosis Date  . Migraine   . Hepatitis   . Hepatitis, water-borne 1965  . Arthritis     left knee  . Dementia   . Vitamin D deficiency disease   . Osteopenia     Patient Active Problem List   Diagnosis Date Noted  . Dementia   . Vitamin D deficiency disease   . Osteopenia   . Weight gain 09/02/2014  . Sprain of ankle 11/04/2013  . Fall at home 11/04/2013  . Acute renal failure 11/04/2013  . Osteopenia of the elderly 11/04/2013  . Encounter for preventive health examination 06/02/2013  . Vascular dementia with depressed mood 05/30/2012  . Vitamin D deficiency 05/05/2012  . Depression 05/04/2012    Past Surgical History  Procedure Laterality Date  . Abdominal hysterectomy  1986  . Knee arthroscopy  2003    Current Outpatient Rx  Name  Route  Sig  Dispense  Refill  . alum & mag hydroxide-simeth (MAALOX/MYLANTA) 200-200-20 MG/5ML suspension   Oral   Take 15 mLs by mouth every 6 (six) hours as needed for indigestion or heartburn.         . bismuth subsalicylate (PEPTO BISMOL) 262 MG/15ML suspension   Oral   Take 30 mLs by mouth every 6 (six) hours as needed.          . Cranberry Fruit 405 MG CAPS      TAKE 1 CAPSULE BY MOUTH TWICE DAILY FOR URINARY HEALTH   60 each   4   . guaiFENesin (ROBITUSSIN) 100 MG/5ML liquid   Oral   Take 200 mg by mouth 3 (three) times daily as needed for cough.         . loperamide (IMODIUM) 2 MG capsule   Oral   Take by mouth as needed for diarrhea or loose stools.         Marland Kitchen LORazepam (ATIVAN) 0.5 MG tablet   Oral   Take 1 tablet (0.5 mg total) by mouth 2 (two) times daily as needed for anxiety.   60 tablet   3   . magnesium hydroxide (MILK OF MAGNESIA) 400 MG/5ML suspension   Oral   Take 15 mLs by mouth daily as needed for mild constipation.         Marland Kitchen MAPAP 500 MG tablet      TAKE 2 TABLETS (1000 MG) BY MOUTH TWICE DAILY AS NEEDED FOR DISCOMFORT OR FEVER   60 tablet   11   . omeprazole (PRILOSEC) 40 MG capsule   Oral   Take 1 capsule (40 mg total) by mouth daily.   90 capsule   1   .  QUEtiapine (SEROQUEL) 50 MG tablet      TAKE 1 TABLET BY MOUTH 1 HOUR BEFORE BEDTIME DAILY   30 tablet   8   . Vitamin D, Ergocalciferol, (DRISDOL) 50000 UNITS CAPS capsule      TAKE 1 CAPSULE BY MOUTH ONCE A WEEK FOR SUPPLEMENT   12 capsule   2   . diphenhydrAMINE (BENADRYL) 25 mg capsule      TAKE 1 CAPSULE BY MOUTH AT BEDTIME FOR COUGH   90 capsule   1   . DiphenhydrAMINE HCl, Sleep, 25 MG CAPS      1 tablet daily at bedtime for cough   30 capsule   2   . sulfamethoxazole-trimethoprim (BACTRIM DS,SEPTRA DS) 800-160 MG per tablet   Oral   Take 1 tablet by mouth 2 (two) times daily. Patient not taking: Reported on 09/14/2014   14 tablet   0   . Tdap (BOOSTRIX) 5-2.5-18.5 LF-MCG/0.5 injection   Intramuscular   Inject 0.5 mLs into the muscle once. Patient not taking: Reported on 08/31/2014   0.5 mL   0   . zoster vaccine live, PF, (ZOSTAVAX) 71245 UNT/0.65ML injection   Subcutaneous   Inject 19,400 Units into the skin once. Patient not taking: Reported on 08/31/2014   1 each   0      Allergies Review of patient's allergies indicates no known allergies.  Family History  Problem Relation Age of Onset  . Cancer Sister     breast    Social History History  Substance Use Topics  . Smoking status: Never Smoker   . Smokeless tobacco: Not on file  . Alcohol Use: No    Review of Systems Constitutional: No fever/chills Eyes: No visual changes. ENT: No sore throat. Cardiovascular: Denies chest pain. Respiratory: Denies shortness of breath. Gastrointestinal: No abdominal pain.  No nausea, no vomiting.  No diarrhea.  No constipation. Genitourinary: Negative for dysuria. Musculoskeletal: Negative for back pain. No new pain, she does have arthritis in her left knee which is stable. No hip pain. Skin: Negative for rash. Neurological: Negative for headaches, focal weakness or numbness.  10-point ROS otherwise negative.  ____________________________________________   PHYSICAL EXAM:  VITAL SIGNS: ED Triage Vitals  Enc Vitals Group     BP 09/14/14 1122 143/79 mmHg     Pulse Rate 09/14/14 1122 58     Resp 09/14/14 1122 18     Temp 09/14/14 1122 97.7 F (36.5 C)     Temp Source 09/14/14 1122 Oral     SpO2 09/14/14 1122 99 %     Weight 09/14/14 1120 147 lb (66.679 kg)     Height 09/14/14 1120 5\' 2"  (1.575 m)     Head Cir --      Peak Flow --      Pain Score 09/14/14 1121 0     Pain Loc --      Pain Edu? --      Excl. in GC? --     Constitutional: Alert and oriented to self and place, which is not well oriented to date which is evidently normal.. Well appearing and in no acute distress. Family and caregivers are present and with her at bedside. Eyes: Conjunctivae are normal. PERRL. EOMI. Head: Atraumatic except for 2 approximately 2 cm long lacerations over the right posterior scalp. Bleeding is controlled. In addition there is an abrasion over the occiput, but no evidence of laceration. Bleeding controlled. Nose: No congestion/rhinnorhea. Mouth/Throat:  Mucous membranes are moist.  Oropharynx non-erythematous. Neck: No stridor.  No cervical spine tenderness Cardiovascular: Normal rate, regular rhythm. Grossly normal heart sounds.  Good peripheral circulation. Respiratory: Normal respiratory effort.  No retractions. Lungs CTAB. Gastrointestinal: Soft and nontender. No distention. No abdominal bruits. No CVA tenderness. Musculoskeletal: No lower extremity tenderness nor edema.  No joint effusions. There is some pain in the left knee with range of motion, but no evidence of injury. Patient and family state this is very chronic. No hip pain. No pain with axial loading. Good normal use of upper extremities. Neurologic:  Normal speech and language. No gross focal neurologic deficits are appreciated. Speech is normal. No pronator drift. No facial droop. Skin:  Skin is warm, dry and intact. No rash noted. Psychiatric: Mood and affect are normal. Speech and behavior are normal.  ____________________________________________   LABS (all labs ordered are listed, but only abnormal results are displayed)  Labs Reviewed - No data to display ____________________________________________  EKG ____________________________________________  RADIOLOGY  CT Head Wo Contrast (Final result) Result time: 09/14/14 13:27:28   Final result by Rad Results In Interface (09/14/14 13:27:28)   Narrative:   CLINICAL DATA: Pain following fall. History of dementia  EXAM: CT HEAD WITHOUT CONTRAST  TECHNIQUE: Contiguous axial images were obtained from the base of the skull through the vertex without intravenous contrast.  COMPARISON: Brain MRI June 19, 2012  FINDINGS: There is diffuse atrophy, moderate and stable. There is evidence of a chronic left frontal subdural hygroma which appear stable. There is no new extradural fluid. In particular, no acute appearing subdural epidural hematomas. There is no mass, hemorrhage, or midline shift. There is small  vessel disease in the centra semiovale bilaterally, stable. There is small vessel disease in the anterior limbs of each external capsule, stable. There is no new gray-white compartment lesion. There is no apparent acute infarct. The bony calvarium appears intact. The mastoid air cells are clear.  IMPRESSION: Stable atrophy and small vessel disease. Stable chronic left frontal subdural hygroma. No acute hemorrhage is seen in the intra-axial or extra-axial compartments. No new extra-axial fluid seen. No midline shift or edema.    ____________________________________________   PROCEDURES  Procedure(s) performed: Laceration repairs, see procedure note(s).  Critical Care performed: No   LACERATION REPAIR Performed by: Sharyn Creamer Authorized by: Sharyn Creamer Consent: Verbal consent obtained. Risks and benefits: risks, benefits and alternatives were discussed Consent given by: patient Patient identity confirmed: provided demographic data Prepped and Draped in normal sterile fashion Wound explored  Laceration Location: Right parietal scalp  Laceration Length: 2 cm   No Foreign Bodies seen or palpated  Anesthesia: local infiltration  Local anesthetic: lidocaine 1% with epinephrine  Anesthetic total: 2 ml  Irrigation method: syringe Amount of cleaning: standard  Skin closure: Staples   Number of sutures: 3   Technique: Interrupted   Patient tolerance: Patient tolerated the procedure well with no immediate complications.  LACERATION REPAIR Performed by: Sharyn Creamer Authorized by: Sharyn Creamer Consent: Verbal consent obtained. Risks and benefits: risks, benefits and alternatives were discussed Consent given by: patient Patient identity confirmed: provided demographic data Prepped and Draped in normal sterile fashion Wound explored  Laceration Location: Right parietal scalp (second laceration)  Laceration Length: 2 cm   No Foreign Bodies seen or  palpated  Anesthesia: local infiltration  Local anesthetic: lidocaine 1% with epinephrine  Anesthetic total: 2 ml  Irrigation method: syringe Amount of cleaning: standard  Skin closure: Staples   Number of sutures: 2   Technique:  Interrupted   Patient tolerance: Patient tolerated the procedure well with no immediate complications.   ____________________________________________   INITIAL IMPRESSION / ASSESSMENT AND PLAN / ED COURSE  Pertinent labs & imaging results that were available during my care of the patient were reviewed by me and considered in my medical decision making (see chart for details).  Mechanical fall. Lacerations to the right posterior scalp. No evidence of acute traumatic injury except for lacerations. I will stable. Irrigated well by nursing staff procedure.  We will obtain a CT head because of fall and evidence of injury, and age. She has no evidence of acute cervical fracture or neurologic deficit. ____________________________________________ ----------------------------------------- 2:30 PM on 09/14/2014 -----------------------------------------  Lacerations repaired with good result. Patient awake alert. She does note she is having some ongoing pain in the right lower leg, on exam and I able to find any focal pain or obvious abnormality. There is no pain with axial loading. Based on the mechanism and fall however, we will obtain x-rays of her lower leg. Good strong dorsalis pedis pulses in the lower she may bilaterally with no acute neurologic deficits. Chest x-rays be negative, plan would be to attempt ambulation with a walker. If unable due to pain and I would consider getting CT of her hip.  ----------------------------------------- 3:32 PM on 09/14/2014 -----------------------------------------  X-rays the right lower extremity and pelvis are negative for fracture. At this point we will trial a "road test" as the patient has no focal pain in the  right lower leg. Per her family she also has arthritis in this leg which is severe, this may be the cause of her pain. Given her slight dementia and is hard to know, but I find no evidence to support acute hip fracture. There is no shortening, no rotation, no pain with axial loading.  Dr. Derrill Kay will follow-up on how the patient doesn't ambulatory trial, if unsuccessful or recommend CT of the right hip anticipate discharge to home should patient be able to walk without significant difficulty. She does use a walker.  FINAL CLINICAL IMPRESSION(S) / ED DIAGNOSES  Final diagnoses:  Fall  Scalp laceration, initial encounter      Sharyn Creamer, MD 09/14/14 1534  ----------------------------------------- 3:44 PM on 09/14/2014 -----------------------------------------  Patient was able to walk well with her walker. No distress. At this point, given her good ambulatory status and x-rays a feeling in safely discharged patient home. She'll be going home with her daughter, she has ongoing care at home. The no distress. Improved.  Sharyn Creamer, MD 09/14/14 [phone]

## 2014-09-15 ENCOUNTER — Other Ambulatory Visit: Payer: Self-pay | Admitting: Internal Medicine

## 2014-09-24 DIAGNOSIS — F0280 Dementia in other diseases classified elsewhere without behavioral disturbance: Secondary | ICD-10-CM | POA: Diagnosis not present

## 2014-09-24 DIAGNOSIS — F329 Major depressive disorder, single episode, unspecified: Secondary | ICD-10-CM | POA: Diagnosis not present

## 2014-09-24 DIAGNOSIS — I10 Essential (primary) hypertension: Secondary | ICD-10-CM | POA: Diagnosis not present

## 2014-09-24 DIAGNOSIS — E559 Vitamin D deficiency, unspecified: Secondary | ICD-10-CM | POA: Diagnosis not present

## 2014-09-24 DIAGNOSIS — Z9181 History of falling: Secondary | ICD-10-CM | POA: Diagnosis not present

## 2014-09-24 DIAGNOSIS — M6281 Muscle weakness (generalized): Secondary | ICD-10-CM | POA: Diagnosis not present

## 2014-09-24 DIAGNOSIS — Z87440 Personal history of urinary (tract) infections: Secondary | ICD-10-CM | POA: Diagnosis not present

## 2014-09-24 DIAGNOSIS — G309 Alzheimer's disease, unspecified: Secondary | ICD-10-CM | POA: Diagnosis not present

## 2014-09-24 DIAGNOSIS — S0100XD Unspecified open wound of scalp, subsequent encounter: Secondary | ICD-10-CM | POA: Diagnosis not present

## 2014-09-27 DIAGNOSIS — G309 Alzheimer's disease, unspecified: Secondary | ICD-10-CM | POA: Diagnosis not present

## 2014-09-27 DIAGNOSIS — F329 Major depressive disorder, single episode, unspecified: Secondary | ICD-10-CM | POA: Diagnosis not present

## 2014-09-27 DIAGNOSIS — F0280 Dementia in other diseases classified elsewhere without behavioral disturbance: Secondary | ICD-10-CM | POA: Diagnosis not present

## 2014-09-27 DIAGNOSIS — M6281 Muscle weakness (generalized): Secondary | ICD-10-CM | POA: Diagnosis not present

## 2014-09-27 DIAGNOSIS — I10 Essential (primary) hypertension: Secondary | ICD-10-CM | POA: Diagnosis not present

## 2014-09-27 DIAGNOSIS — S0100XD Unspecified open wound of scalp, subsequent encounter: Secondary | ICD-10-CM | POA: Diagnosis not present

## 2014-09-28 DIAGNOSIS — I10 Essential (primary) hypertension: Secondary | ICD-10-CM | POA: Diagnosis not present

## 2014-09-28 DIAGNOSIS — S0100XD Unspecified open wound of scalp, subsequent encounter: Secondary | ICD-10-CM | POA: Diagnosis not present

## 2014-09-28 DIAGNOSIS — F329 Major depressive disorder, single episode, unspecified: Secondary | ICD-10-CM | POA: Diagnosis not present

## 2014-09-28 DIAGNOSIS — F0280 Dementia in other diseases classified elsewhere without behavioral disturbance: Secondary | ICD-10-CM | POA: Diagnosis not present

## 2014-09-28 DIAGNOSIS — M6281 Muscle weakness (generalized): Secondary | ICD-10-CM | POA: Diagnosis not present

## 2014-09-28 DIAGNOSIS — G309 Alzheimer's disease, unspecified: Secondary | ICD-10-CM | POA: Diagnosis not present

## 2014-09-29 ENCOUNTER — Other Ambulatory Visit: Payer: Self-pay | Admitting: Internal Medicine

## 2014-09-29 NOTE — Telephone Encounter (Signed)
Last refill and OV 6.2.16.  Please advise refill

## 2014-10-02 DIAGNOSIS — I10 Essential (primary) hypertension: Secondary | ICD-10-CM | POA: Diagnosis not present

## 2014-10-02 DIAGNOSIS — S0100XD Unspecified open wound of scalp, subsequent encounter: Secondary | ICD-10-CM | POA: Diagnosis not present

## 2014-10-02 DIAGNOSIS — G309 Alzheimer's disease, unspecified: Secondary | ICD-10-CM | POA: Diagnosis not present

## 2014-10-02 DIAGNOSIS — F0280 Dementia in other diseases classified elsewhere without behavioral disturbance: Secondary | ICD-10-CM | POA: Diagnosis not present

## 2014-10-02 DIAGNOSIS — M6281 Muscle weakness (generalized): Secondary | ICD-10-CM | POA: Diagnosis not present

## 2014-10-02 DIAGNOSIS — F329 Major depressive disorder, single episode, unspecified: Secondary | ICD-10-CM | POA: Diagnosis not present

## 2014-10-03 DIAGNOSIS — S0100XD Unspecified open wound of scalp, subsequent encounter: Secondary | ICD-10-CM | POA: Diagnosis not present

## 2014-10-03 DIAGNOSIS — F329 Major depressive disorder, single episode, unspecified: Secondary | ICD-10-CM | POA: Diagnosis not present

## 2014-10-03 DIAGNOSIS — I10 Essential (primary) hypertension: Secondary | ICD-10-CM | POA: Diagnosis not present

## 2014-10-03 DIAGNOSIS — G309 Alzheimer's disease, unspecified: Secondary | ICD-10-CM | POA: Diagnosis not present

## 2014-10-03 DIAGNOSIS — F0280 Dementia in other diseases classified elsewhere without behavioral disturbance: Secondary | ICD-10-CM | POA: Diagnosis not present

## 2014-10-03 DIAGNOSIS — M6281 Muscle weakness (generalized): Secondary | ICD-10-CM | POA: Diagnosis not present

## 2014-10-04 NOTE — Telephone Encounter (Signed)
Faxed to pharmacy

## 2014-10-04 NOTE — Telephone Encounter (Signed)
Ok to refill,  printed rx  

## 2014-10-12 DIAGNOSIS — I10 Essential (primary) hypertension: Secondary | ICD-10-CM | POA: Diagnosis not present

## 2014-10-12 DIAGNOSIS — M6281 Muscle weakness (generalized): Secondary | ICD-10-CM | POA: Diagnosis not present

## 2014-10-12 DIAGNOSIS — G309 Alzheimer's disease, unspecified: Secondary | ICD-10-CM | POA: Diagnosis not present

## 2014-10-12 DIAGNOSIS — F329 Major depressive disorder, single episode, unspecified: Secondary | ICD-10-CM | POA: Diagnosis not present

## 2014-10-12 DIAGNOSIS — S0100XD Unspecified open wound of scalp, subsequent encounter: Secondary | ICD-10-CM | POA: Diagnosis not present

## 2014-10-12 DIAGNOSIS — F0280 Dementia in other diseases classified elsewhere without behavioral disturbance: Secondary | ICD-10-CM | POA: Diagnosis not present

## 2014-10-13 DIAGNOSIS — F0280 Dementia in other diseases classified elsewhere without behavioral disturbance: Secondary | ICD-10-CM | POA: Diagnosis not present

## 2014-10-13 DIAGNOSIS — G309 Alzheimer's disease, unspecified: Secondary | ICD-10-CM | POA: Diagnosis not present

## 2014-10-13 DIAGNOSIS — I10 Essential (primary) hypertension: Secondary | ICD-10-CM | POA: Diagnosis not present

## 2014-10-13 DIAGNOSIS — F329 Major depressive disorder, single episode, unspecified: Secondary | ICD-10-CM | POA: Diagnosis not present

## 2014-10-13 DIAGNOSIS — M6281 Muscle weakness (generalized): Secondary | ICD-10-CM | POA: Diagnosis not present

## 2014-10-13 DIAGNOSIS — S0100XD Unspecified open wound of scalp, subsequent encounter: Secondary | ICD-10-CM | POA: Diagnosis not present

## 2014-10-16 DIAGNOSIS — F0280 Dementia in other diseases classified elsewhere without behavioral disturbance: Secondary | ICD-10-CM | POA: Diagnosis not present

## 2014-10-16 DIAGNOSIS — G309 Alzheimer's disease, unspecified: Secondary | ICD-10-CM | POA: Diagnosis not present

## 2014-10-16 DIAGNOSIS — S0100XD Unspecified open wound of scalp, subsequent encounter: Secondary | ICD-10-CM | POA: Diagnosis not present

## 2014-10-16 DIAGNOSIS — M6281 Muscle weakness (generalized): Secondary | ICD-10-CM | POA: Diagnosis not present

## 2014-10-16 DIAGNOSIS — F329 Major depressive disorder, single episode, unspecified: Secondary | ICD-10-CM | POA: Diagnosis not present

## 2014-10-16 DIAGNOSIS — I10 Essential (primary) hypertension: Secondary | ICD-10-CM | POA: Diagnosis not present

## 2014-10-17 ENCOUNTER — Other Ambulatory Visit: Payer: Self-pay | Admitting: Internal Medicine

## 2014-10-24 DIAGNOSIS — M6281 Muscle weakness (generalized): Secondary | ICD-10-CM | POA: Diagnosis not present

## 2014-10-24 DIAGNOSIS — G309 Alzheimer's disease, unspecified: Secondary | ICD-10-CM | POA: Diagnosis not present

## 2014-10-24 DIAGNOSIS — I10 Essential (primary) hypertension: Secondary | ICD-10-CM | POA: Diagnosis not present

## 2014-10-24 DIAGNOSIS — F329 Major depressive disorder, single episode, unspecified: Secondary | ICD-10-CM | POA: Diagnosis not present

## 2014-10-24 DIAGNOSIS — F0280 Dementia in other diseases classified elsewhere without behavioral disturbance: Secondary | ICD-10-CM | POA: Diagnosis not present

## 2014-10-24 DIAGNOSIS — S0100XD Unspecified open wound of scalp, subsequent encounter: Secondary | ICD-10-CM | POA: Diagnosis not present

## 2014-10-27 DIAGNOSIS — I10 Essential (primary) hypertension: Secondary | ICD-10-CM | POA: Diagnosis not present

## 2014-10-27 DIAGNOSIS — F329 Major depressive disorder, single episode, unspecified: Secondary | ICD-10-CM | POA: Diagnosis not present

## 2014-10-27 DIAGNOSIS — F0280 Dementia in other diseases classified elsewhere without behavioral disturbance: Secondary | ICD-10-CM | POA: Diagnosis not present

## 2014-10-27 DIAGNOSIS — S0100XD Unspecified open wound of scalp, subsequent encounter: Secondary | ICD-10-CM | POA: Diagnosis not present

## 2014-10-27 DIAGNOSIS — G309 Alzheimer's disease, unspecified: Secondary | ICD-10-CM | POA: Diagnosis not present

## 2014-10-27 DIAGNOSIS — M6281 Muscle weakness (generalized): Secondary | ICD-10-CM | POA: Diagnosis not present

## 2014-10-30 DIAGNOSIS — I10 Essential (primary) hypertension: Secondary | ICD-10-CM | POA: Diagnosis not present

## 2014-10-30 DIAGNOSIS — M6281 Muscle weakness (generalized): Secondary | ICD-10-CM | POA: Diagnosis not present

## 2014-10-30 DIAGNOSIS — F0280 Dementia in other diseases classified elsewhere without behavioral disturbance: Secondary | ICD-10-CM | POA: Diagnosis not present

## 2014-10-30 DIAGNOSIS — G309 Alzheimer's disease, unspecified: Secondary | ICD-10-CM | POA: Diagnosis not present

## 2014-10-30 DIAGNOSIS — F329 Major depressive disorder, single episode, unspecified: Secondary | ICD-10-CM | POA: Diagnosis not present

## 2014-10-30 DIAGNOSIS — S0100XD Unspecified open wound of scalp, subsequent encounter: Secondary | ICD-10-CM | POA: Diagnosis not present

## 2014-11-02 ENCOUNTER — Ambulatory Visit (INDEPENDENT_AMBULATORY_CARE_PROVIDER_SITE_OTHER): Payer: Medicare Other | Admitting: Internal Medicine

## 2014-11-02 ENCOUNTER — Encounter: Payer: Self-pay | Admitting: Internal Medicine

## 2014-11-02 ENCOUNTER — Telehealth: Payer: Self-pay | Admitting: Internal Medicine

## 2014-11-02 VITALS — BP 106/68 | HR 82 | Temp 97.2°F | Resp 12 | Ht 62.0 in | Wt 149.0 lb

## 2014-11-02 DIAGNOSIS — E559 Vitamin D deficiency, unspecified: Secondary | ICD-10-CM | POA: Diagnosis not present

## 2014-11-02 DIAGNOSIS — R635 Abnormal weight gain: Secondary | ICD-10-CM

## 2014-11-02 DIAGNOSIS — M899 Disorder of bone, unspecified: Secondary | ICD-10-CM

## 2014-11-02 DIAGNOSIS — F0391 Unspecified dementia with behavioral disturbance: Secondary | ICD-10-CM | POA: Diagnosis not present

## 2014-11-02 DIAGNOSIS — F03918 Unspecified dementia, unspecified severity, with other behavioral disturbance: Secondary | ICD-10-CM

## 2014-11-02 DIAGNOSIS — Z9181 History of falling: Secondary | ICD-10-CM

## 2014-11-02 DIAGNOSIS — Z66 Do not resuscitate: Secondary | ICD-10-CM

## 2014-11-02 DIAGNOSIS — M8580 Other specified disorders of bone density and structure, unspecified site: Secondary | ICD-10-CM

## 2014-11-02 MED ORDER — TETANUS-DIPHTH-ACELL PERTUSSIS 5-2.5-18.5 LF-MCG/0.5 IM SUSP: 0.5000 mL | Freq: Once | INTRAMUSCULAR | Status: DC

## 2014-11-02 MED ORDER — ZOSTER VACCINE LIVE 19400 UNT/0.65ML ~~LOC~~ SOLR: 0.6500 mL | Freq: Once | SUBCUTANEOUS | Status: DC

## 2014-11-02 NOTE — Progress Notes (Signed)
Pre-visit discussion using our clinic review tool. No additional management support is needed unless otherwise documented below in the visit note.  

## 2014-11-02 NOTE — Telephone Encounter (Signed)
Left message for patient to return call to office. 

## 2014-11-02 NOTE — Telephone Encounter (Signed)
Message sent to son via patient's MyChart   Mr Catherine Meyers saw Devetta today in the office and she appeared to be doing well.  I noticed that she has gained more weight and have asked the staff to increase her vegetables and decrease her pastry type desserts.  She has also developed incontinence of bowel and bladder.    These developments are all part of the natural history of her dementia.  Based on her current state,  I wanted to discuss CODE STATUS with you  Because I [email or web address] would appropriate to sign a DO NOT RESUSCITATE  Order so that the facility will have the ability to direct EMS if something were to happen.  If you would like to make an appointment to discuss this further ,  I would be happy to see you and Elley again soon  Otherwise I will see her again in 6 months.

## 2014-11-02 NOTE — Patient Instructions (Signed)
Catherine Meyers is doing well on her current regimen  Her weight gain should be addressed with fewer desserts and more vegetables when feasible  We will call her son to discuss whether he would like to change her CODE status to Do Not Resuscitate

## 2014-11-02 NOTE — Progress Notes (Signed)
Subjective:  Patient ID: Catherine Meyers, female    DOB: 30-Apr-1927  Age: 81 y.o. MRN: [id number]  CC: The primary encounter diagnosis was Dementia with behavioral disturbance. Diagnoses of Weight gain, Osteopenia of the elderly, Vitamin D deficiency, History of recent fall, and Do not resuscitate status were also pertinent to this visit.  HPI Catherine Meyers presents for  Follow up on dementia with nocturnal agitation. Patient now liviing in AL Facility and has developed incontinence of bowel and bladder.  Lorazepam was added in June for nighttime agitation. she is tolerating it well without excessive sedation .  Seroquel has also been added as a scheduled dose at lunch and dinner.    notes from June 16 Er visit were reviewed.   she sustained a fall backward during a trip to the bathroom, during which she had several minro lacerations to the occipital and temporal scalp.   CT of head, and plain films of the pelvis and right leg were done  and negative for acute changes. Advanced DJD of knee was noted.   She is not accompanied by family today, but by a Public relations account executive.  Her appetite is excellent , and she continues to gain weight.  Diet reviewed with facilitator.  She is served a Scientist, forensic." All residents are served dessert with lunch and dinner and the dessert varies between fresh and canner fruit,  Fruit cobbler,  cake or cookies.  .    Outpatient Prescriptions Prior to Visit  Medication Sig Dispense Refill  . alum & mag hydroxide-simeth (MAALOX/MYLANTA) 200-200-20 MG/5ML suspension Take 15 mLs by mouth every 6 (six) hours as needed for indigestion or heartburn.    . bismuth subsalicylate (PEPTO BISMOL) 262 MG/15ML suspension Take 30 mLs by mouth every 6 (six) hours as needed.    . Cranberry Fruit 405 MG CAPS TAKE 1 CAPSULE BY MOUTH TWICE DAILY FOR URINARY HEALTH 60 each 3  . guaiFENesin (ROBITUSSIN) 100 MG/5ML liquid Take 200 mg by mouth 3 (three) times daily as needed for cough.    .  loperamide (IMODIUM) 2 MG capsule Take by mouth as needed for diarrhea or loose stools.    Marland Kitchen LORazepam (ATIVAN) 0.5 MG tablet TAKE 1 TABLET BY MOUTH TWICE DAILY FOR AGITATION 60 tablet 5  . magnesium hydroxide (MILK OF MAGNESIA) 400 MG/5ML suspension Take 15 mLs by mouth daily as needed for mild constipation.    Marland Kitchen MAPAP 500 MG tablet TAKE 2 TABLETS (1000 MG) BY MOUTH TWICE DAILY AS NEEDED FOR DISCOMFORT OR FEVER 60 tablet 11  . omeprazole (PRILOSEC) 40 MG capsule TAKE 1 CAPSULE BY MOUTH ONCE DAILY 90 capsule 1  . QUEtiapine (SEROQUEL) 50 MG tablet TAKE 1 TABLET BY MOUTH 1 HOUR BEFORE BEDTIME DAILY 30 tablet 8  . Vitamin D, Ergocalciferol, (DRISDOL) 50000 UNITS CAPS capsule TAKE 1 CAPSULE BY MOUTH ONCE A WEEK FOR SUPPLEMENT 12 capsule 2  . zoster vaccine live, PF, (ZOSTAVAX) 40981 UNT/0.65ML injection Inject 19,400 Units into the skin once. 1 each 0  . sulfamethoxazole-trimethoprim (BACTRIM DS,SEPTRA DS) 800-160 MG per tablet Take 1 tablet by mouth 2 (two) times daily. 14 tablet 0  . Tdap (BOOSTRIX) 5-2.5-18.5 LF-MCG/0.5 injection Inject 0.5 mLs into the muscle once. 0.5 mL 0  . diphenhydrAMINE (BENADRYL) 25 mg capsule TAKE 1 CAPSULE BY MOUTH AT BEDTIME FOR COUGH (Patient not taking: Reported on 11/02/2014) 90 capsule 1  . DiphenhydrAMINE HCl, Sleep, 25 MG CAPS 1 tablet daily at bedtime for cough (Patient not taking:  Reported on 11/02/2014) 30 capsule 2   No facility-administered medications prior to visit.    Review of Systems;  Patient denies headache, fevers, malaise, unintentional weight loss, skin rash, eye pain, sinus congestion and sinus pain, sore throat, dysphagia,  hemoptysis , cough, dyspnea, wheezing, chest pain, palpitations, orthopnea, edema, abdominal pain, nausea, melena, diarrhea, constipation, flank pain, dysuria, hematuria, urinary  Frequency, nocturia, numbness, tingling, seizures,  Focal weakness, Loss of consciousness,  Tremor, insomnia, depression, anxiety, and suicidal ideation.       Objective:  BP 106/68 mmHg  Pulse 82  Temp(Src) 97.2 F (36.2 C) (Oral)  Resp 12  Ht  (1.575 m)  Wt 149 lb (67.586 kg)  BMI 27.25 kg/m2  SpO2 95%  BP Readings from Last 3 Encounters:  11/02/14 106/68  09/14/14 161/111  08/31/14 138/72    Wt Readings from Last 3 Encounters:  11/02/14 149 lb (67.586 kg)  09/14/14 147 lb (66.679 kg)  08/31/14 147 lb 8 oz (66.906 kg)    General appearance: alert, cooperative and appears stated age Ears: normal TM's and external ear canals both ears Throat: lips, mucosa, and tongue normal; teeth and gums normal Neck: no adenopathy, no carotid bruit, supple, symmetrical, trachea midline and thyroid not enlarged, symmetric, no tenderness/mass/nodules Back: symmetric, no curvature. ROM normal. No CVA tenderness. Lungs: clear to auscultation bilaterally Heart: regular rate and rhythm, S1, S2 normal, no murmur, click, rub or gallop Abdomen: soft, non-tender; bowel sounds normal; no masses,  no organomegaly Pulses: 2+ and symmetric Skin: Skin color, texture, turgor normal. No rashes or lesions Lymph nodes: Cervical, supraclavicular, and axillary nodes normal.  No results found for: HGBA1C  Lab Results  Component Value Date   CREATININE 1.03 08/17/2014   CREATININE 0.9 11/01/2013   CREATININE 1.35* 08/04/202015    Lab Results  Component Value Date   WBC 8.1 08/04/202015   HGB 13.9 08/04/202015   HCT 42.7 08/04/202015   PLT 255 08/04/202015   GLUCOSE 196* 08/17/2014   CHOL 176 05/04/2012   TRIG 95.0 05/04/2012   HDL 55.70 05/04/2012   LDLCALC 101* 05/04/2012   ALT 24 08/04/202015   AST 31 08/04/202015   NA 139 08/17/2014   K 4.1 08/17/2014   CL 103 08/17/2014   CREATININE 1.03 08/17/2014   BUN 20 08/17/2014   CO2 30 08/17/2014   TSH 3.17 05/31/2013    Dg Pelvis 1-2 Views  09/14/2014   CLINICAL DATA:  Trauma secondary to a fall this morning. Lacerations to the head.  EXAM: PELVIS - 1-2 VIEW  COMPARISON:  Radiographs dated  08/04/202015  FINDINGS: There is no evidence of pelvic fracture or diastasis. No pelvic bone lesions are seen.  IMPRESSION: Negative.   Electronically Signed   By: Francene Boyers M.D.   On: 09/14/2014 15:31   Dg Tibia/fibula Right  09/14/2014   CLINICAL DATA:  Larey Seat in bathroom today.  Head laceration  EXAM: RIGHT TIBIA AND FIBULA - 2 VIEW  COMPARISON:  MR knees 09/04/2005  FINDINGS: Negative for fracture  Advanced degenerative change in the medial knee joint with joint space narrowing and spurring. Widening of the lateral joint space of the knee.  IMPRESSION: Negative for fracture   Electronically Signed   By: Marlan Palau M.D.   On: 09/14/2014 15:11   Ct Head Wo Contrast  09/14/2014   CLINICAL DATA:  Pain following fall.  History of dementia  EXAM: CT HEAD WITHOUT CONTRAST  TECHNIQUE: Contiguous axial images were obtained from the base  of the skull through the vertex without intravenous contrast.  COMPARISON:  Brain MRI June 19, 2012  FINDINGS: There is diffuse atrophy, moderate and stable. There is evidence of a chronic left frontal subdural hygroma which appear stable. There is no new extradural fluid. In particular, no acute appearing subdural epidural hematomas. There is no mass, hemorrhage, or midline shift. There is small vessel disease in the centra semiovale bilaterally, stable. There is small vessel disease in the anterior limbs of each external capsule, stable. There is no new gray-white compartment lesion. There is no apparent acute infarct. The bony calvarium appears intact. The mastoid air cells are clear.  IMPRESSION: Stable atrophy and small vessel disease. Stable chronic left frontal subdural hygroma. No acute hemorrhage is seen in the intra-axial or extra-axial compartments. No new extra-axial fluid seen. No midline shift or edema.   Electronically Signed   By: Bretta Bang III M.D.   On: 09/14/2014 13:27   Dg Femur, Min 2 Views Right  09/14/2014   CLINICAL DATA:  Initial encounter  for fell in bathroom.  Dementia.  EXAM: RIGHT FEMUR 2 VIEWS  COMPARISON:  None.  FINDINGS: Moderate knee joint osteoarthritis medially. Mild to moderate patellofemoral osteoarthritis. No acute fracture or dislocation.  IMPRESSION: No acute osseous abnormality.   Electronically Signed   By: Jeronimo Greaves M.D.   On: 09/14/2014 15:15    Assessment & Plan:   Problem List Items Addressed This Visit      Unprioritized   Vitamin D deficiency    Normalized with  weekly supplementation give facility residence       Osteopenia of the elderly    DEXA has not been done due to prognosis, CKD and inability to tolerate medication given her dementia.       Weight gain    Secondary to seroquel and change in diet since move to facility.  Discussed need to reduce her desserts with staff.       Dementia with behavioral disturbance - Primary    Vascular,  by CT/MRI.  She has developed bowel and bladder incontinence.  Nighttime agitation controlled with seroquel and lorazepam.  DNR status obtained today via son who has POA      History of recent fall    Occurred at the AL facility wile supervised in the bathroom.  No fractures or SDH by ER evaluation.       Do not resuscitate status    Agreed to by her son who has POA .  Forms will be sent to pharmacy          I have discontinued Ms. Metzner's DiphenhydrAMINE HCl (Sleep), diphenhydrAMINE, and sulfamethoxazole-trimethoprim. I am also having her start on zoster vaccine live (PF). Additionally, I am having her maintain her zoster vaccine live (PF), QUEtiapine, MAPAP, Vitamin D (Ergocalciferol), guaiFENesin, bismuth subsalicylate, alum & mag hydroxide-simeth, loperamide, magnesium hydroxide, omeprazole, LORazepam, Cranberry Fruit, and Tdap.  Meds ordered this encounter  Medications  . Tdap (BOOSTRIX) 5-2.5-18.5 LF-MCG/0.5 injection    Sig: Inject 0.5 mLs into the muscle once.    Dispense:  0.5 mL    Refill:  0  . zoster vaccine live, PF, (ZOSTAVAX)  19400 UNT/0.65ML injection    Sig: Inject 19,400 Units into the skin once.    Dispense:  1 each    Refill:  0    Medications Discontinued During This Encounter  Medication Reason  . diphenhydrAMINE (BENADRYL) 25 mg capsule Change in therapy  . DiphenhydrAMINE HCl, Sleep, 25 MG  CAPS Change in therapy  . sulfamethoxazole-trimethoprim (BACTRIM DS,SEPTRA DS) 800-160 MG per tablet   . Tdap (BOOSTRIX) 5-2.5-18.5 LF-MCG/0.5 injection Reorder   A total of 40 minutes was spent with patient more than half of which was spent in counseling patient on the above mentioned issues , reviewing and explaining recent labs and imaging studies done, and coordination of care.  Follow-up: No Follow-up on file.   Sherlene Shams, MD

## 2014-11-03 NOTE — Telephone Encounter (Signed)
Talk with patient DPR (son) and he is fine with the DPR signing but he staed he cannot come in with his mother due to she will not understand? Please advise does he need to come in.

## 2014-11-03 NOTE — Telephone Encounter (Signed)
No, he does not need to come in unless he wants to discuss,. If he is comfortable making a decision without a face to face,  I will sign the DNr orders on Monday and send to facility

## 2014-11-05 DIAGNOSIS — R296 Repeated falls: Secondary | ICD-10-CM | POA: Insufficient documentation

## 2014-11-05 DIAGNOSIS — Z66 Do not resuscitate: Secondary | ICD-10-CM | POA: Insufficient documentation

## 2014-11-05 NOTE — Assessment & Plan Note (Addendum)
Normalized with  weekly supplementation give facility residence

## 2014-11-05 NOTE — Assessment & Plan Note (Signed)
Secondary to seroquel and change in diet since move to facility.  Discussed need to reduce her desserts with staff.

## 2014-11-05 NOTE — Assessment & Plan Note (Signed)
Vascular,  by CT/MRI.  She has developed bowel and bladder incontinence.  Nighttime agitation controlled with seroquel and lorazepam.  DNR status obtained today via son who has POA

## 2014-11-05 NOTE — Assessment & Plan Note (Signed)
Agreed to by her son who has POA .  Forms will be sent to pharmacy

## 2014-11-05 NOTE — Assessment & Plan Note (Signed)
Occurred at the AL facility wile supervised in the bathroom.  No fractures or SDH by ER evaluation.

## 2014-11-05 NOTE — Assessment & Plan Note (Signed)
DEXA has not been done due to prognosis, CKD and inability to tolerate medication given her dementia.

## 2014-11-06 DIAGNOSIS — I10 Essential (primary) hypertension: Secondary | ICD-10-CM | POA: Diagnosis not present

## 2014-11-06 DIAGNOSIS — F0280 Dementia in other diseases classified elsewhere without behavioral disturbance: Secondary | ICD-10-CM | POA: Diagnosis not present

## 2014-11-06 DIAGNOSIS — S0100XD Unspecified open wound of scalp, subsequent encounter: Secondary | ICD-10-CM | POA: Diagnosis not present

## 2014-11-06 DIAGNOSIS — G309 Alzheimer's disease, unspecified: Secondary | ICD-10-CM | POA: Diagnosis not present

## 2014-11-06 DIAGNOSIS — F329 Major depressive disorder, single episode, unspecified: Secondary | ICD-10-CM | POA: Diagnosis not present

## 2014-11-06 DIAGNOSIS — M6281 Muscle weakness (generalized): Secondary | ICD-10-CM | POA: Diagnosis not present

## 2014-11-08 NOTE — Telephone Encounter (Signed)
Patient FL2 ready for pick up notified facility copy placed to scan.

## 2014-11-08 NOTE — Telephone Encounter (Signed)
DNR signed and attached to [facility]

## 2014-11-13 DIAGNOSIS — S0100XD Unspecified open wound of scalp, subsequent encounter: Secondary | ICD-10-CM | POA: Diagnosis not present

## 2014-11-13 DIAGNOSIS — M6281 Muscle weakness (generalized): Secondary | ICD-10-CM | POA: Diagnosis not present

## 2014-11-13 DIAGNOSIS — I10 Essential (primary) hypertension: Secondary | ICD-10-CM | POA: Diagnosis not present

## 2014-11-13 DIAGNOSIS — F0280 Dementia in other diseases classified elsewhere without behavioral disturbance: Secondary | ICD-10-CM | POA: Diagnosis not present

## 2014-11-13 DIAGNOSIS — F329 Major depressive disorder, single episode, unspecified: Secondary | ICD-10-CM | POA: Diagnosis not present

## 2014-11-13 DIAGNOSIS — G309 Alzheimer's disease, unspecified: Secondary | ICD-10-CM | POA: Diagnosis not present

## 2014-12-21 DIAGNOSIS — M19072 Primary osteoarthritis, left ankle and foot: Secondary | ICD-10-CM | POA: Diagnosis not present

## 2014-12-21 DIAGNOSIS — M79672 Pain in left foot: Secondary | ICD-10-CM | POA: Diagnosis not present

## 2014-12-21 DIAGNOSIS — S92405A Nondisplaced unspecified fracture of left great toe, initial encounter for closed fracture: Secondary | ICD-10-CM | POA: Diagnosis not present

## 2014-12-25 DIAGNOSIS — S92425A Nondisplaced fracture of distal phalanx of left great toe, initial encounter for closed fracture: Secondary | ICD-10-CM | POA: Diagnosis not present

## 2015-01-12 ENCOUNTER — Other Ambulatory Visit: Payer: Self-pay | Admitting: Internal Medicine

## 2015-01-15 DIAGNOSIS — M79672 Pain in left foot: Secondary | ICD-10-CM | POA: Diagnosis not present

## 2015-01-15 DIAGNOSIS — S92425D Nondisplaced fracture of distal phalanx of left great toe, subsequent encounter for fracture with routine healing: Secondary | ICD-10-CM | POA: Diagnosis not present

## 2015-02-14 ENCOUNTER — Other Ambulatory Visit: Payer: Self-pay | Admitting: Internal Medicine

## 2015-03-02 ENCOUNTER — Other Ambulatory Visit: Payer: Self-pay | Admitting: Internal Medicine

## 2015-03-13 ENCOUNTER — Other Ambulatory Visit: Payer: Self-pay | Admitting: Internal Medicine

## 2015-03-17 ENCOUNTER — Emergency Department: Payer: Medicare Other

## 2015-03-17 ENCOUNTER — Encounter: Payer: Self-pay | Admitting: *Deleted

## 2015-03-17 ENCOUNTER — Inpatient Hospital Stay
Admission: EM | Admit: 2015-03-17 | Discharge: 2015-03-21 | DRG: 482 | Disposition: A | Discharge status: Skilled Nursing Facility | Payer: Medicare Other | Attending: Internal Medicine | Admitting: Internal Medicine

## 2015-03-17 DIAGNOSIS — R001 Bradycardia, unspecified: Secondary | ICD-10-CM | POA: Diagnosis not present

## 2015-03-17 DIAGNOSIS — K5730 Diverticulosis of large intestine without perforation or abscess without bleeding: Secondary | ICD-10-CM | POA: Diagnosis present

## 2015-03-17 DIAGNOSIS — M810 Age-related osteoporosis without current pathological fracture: Secondary | ICD-10-CM | POA: Diagnosis present

## 2015-03-17 DIAGNOSIS — R296 Repeated falls: Secondary | ICD-10-CM | POA: Diagnosis present

## 2015-03-17 DIAGNOSIS — Z79899 Other long term (current) drug therapy: Secondary | ICD-10-CM | POA: Diagnosis not present

## 2015-03-17 DIAGNOSIS — F419 Anxiety disorder, unspecified: Secondary | ICD-10-CM | POA: Diagnosis not present

## 2015-03-17 DIAGNOSIS — E559 Vitamin D deficiency, unspecified: Secondary | ICD-10-CM | POA: Diagnosis present

## 2015-03-17 DIAGNOSIS — Z419 Encounter for procedure for purposes other than remedying health state, unspecified: Secondary | ICD-10-CM | POA: Diagnosis not present

## 2015-03-17 DIAGNOSIS — S72001A Fracture of unspecified part of neck of right femur, initial encounter for closed fracture: Principal | ICD-10-CM | POA: Diagnosis present

## 2015-03-17 DIAGNOSIS — R262 Difficulty in walking, not elsewhere classified: Secondary | ICD-10-CM | POA: Diagnosis not present

## 2015-03-17 DIAGNOSIS — M1611 Unilateral primary osteoarthritis, right hip: Secondary | ICD-10-CM | POA: Diagnosis present

## 2015-03-17 DIAGNOSIS — F0390 Unspecified dementia without behavioral disturbance: Secondary | ICD-10-CM | POA: Diagnosis present

## 2015-03-17 DIAGNOSIS — I739 Peripheral vascular disease, unspecified: Secondary | ICD-10-CM | POA: Diagnosis not present

## 2015-03-17 DIAGNOSIS — Z741 Need for assistance with personal care: Secondary | ICD-10-CM | POA: Diagnosis not present

## 2015-03-17 DIAGNOSIS — G43909 Migraine, unspecified, not intractable, without status migrainosus: Secondary | ICD-10-CM | POA: Diagnosis present

## 2015-03-17 DIAGNOSIS — S72001D Fracture of unspecified part of neck of right femur, subsequent encounter for closed fracture with routine healing: Secondary | ICD-10-CM | POA: Diagnosis not present

## 2015-03-17 DIAGNOSIS — F0281 Dementia in other diseases classified elsewhere with behavioral disturbance: Secondary | ICD-10-CM | POA: Diagnosis not present

## 2015-03-17 DIAGNOSIS — W19XXXA Unspecified fall, initial encounter: Secondary | ICD-10-CM | POA: Diagnosis present

## 2015-03-17 DIAGNOSIS — R2681 Unsteadiness on feet: Secondary | ICD-10-CM | POA: Diagnosis present

## 2015-03-17 DIAGNOSIS — R350 Frequency of micturition: Secondary | ICD-10-CM | POA: Diagnosis present

## 2015-03-17 DIAGNOSIS — F329 Major depressive disorder, single episode, unspecified: Secondary | ICD-10-CM | POA: Diagnosis not present

## 2015-03-17 DIAGNOSIS — R4182 Altered mental status, unspecified: Secondary | ICD-10-CM | POA: Diagnosis not present

## 2015-03-17 DIAGNOSIS — R488 Other symbolic dysfunctions: Secondary | ICD-10-CM | POA: Diagnosis not present

## 2015-03-17 DIAGNOSIS — M79605 Pain in left leg: Secondary | ICD-10-CM | POA: Diagnosis not present

## 2015-03-17 DIAGNOSIS — Z01810 Encounter for preprocedural cardiovascular examination: Secondary | ICD-10-CM | POA: Diagnosis not present

## 2015-03-17 DIAGNOSIS — Z66 Do not resuscitate: Secondary | ICD-10-CM | POA: Diagnosis present

## 2015-03-17 DIAGNOSIS — K219 Gastro-esophageal reflux disease without esophagitis: Secondary | ICD-10-CM | POA: Diagnosis not present

## 2015-03-17 DIAGNOSIS — G301 Alzheimer's disease with late onset: Secondary | ICD-10-CM | POA: Diagnosis not present

## 2015-03-17 DIAGNOSIS — Z23 Encounter for immunization: Secondary | ICD-10-CM | POA: Diagnosis not present

## 2015-03-17 DIAGNOSIS — M6281 Muscle weakness (generalized): Secondary | ICD-10-CM | POA: Diagnosis not present

## 2015-03-17 LAB — URINALYSIS COMPLETE WITH MICROSCOPIC (ARMC ONLY)
BACTERIA UA: NONE SEEN
Bilirubin Urine: NEGATIVE
GLUCOSE, UA: NEGATIVE mg/dL
HGB URINE DIPSTICK: NEGATIVE
Leukocytes, UA: NEGATIVE
NITRITE: NEGATIVE
PH: 8 (ref 5.0–8.0)
Protein, ur: NEGATIVE mg/dL
Specific Gravity, Urine: 1.006 (ref 1.005–1.030)

## 2015-03-17 LAB — CBC
HEMATOCRIT: 42.7 % (ref 35.0–47.0)
Hemoglobin: 14.1 g/dL (ref 12.0–16.0)
MCH: 30.6 pg (ref 26.0–34.0)
MCHC: 32.9 g/dL (ref 32.0–36.0)
MCV: 92.9 fL (ref 80.0–100.0)
PLATELETS: 240 10*3/uL (ref 150–440)
RBC: 4.6 MIL/uL (ref 3.80–5.20)
RDW: 14.1 % (ref 11.5–14.5)
WBC: 8.3 10*3/uL (ref 3.6–11.0)

## 2015-03-17 LAB — BASIC METABOLIC PANEL
Anion gap: 6 (ref 5–15)
BUN: 21 mg/dL — ABNORMAL HIGH (ref 6–20)
CO2: 27 mmol/L (ref 22–32)
Calcium: 9.1 mg/dL (ref 8.9–10.3)
Chloride: 105 mmol/L (ref 101–111)
Creatinine, Ser: 1.08 mg/dL — ABNORMAL HIGH (ref 0.44–1.00)
GFR calc Af Amer: 53 mL/min — ABNORMAL LOW (ref >60)
GFR, EST NON AFRICAN AMERICAN: 45 mL/min — AB (ref >60)
Glucose, Bld: 122 mg/dL — ABNORMAL HIGH (ref 65–99)
POTASSIUM: 4.5 mmol/L (ref 3.5–5.1)
Sodium: 138 mmol/L (ref 135–145)

## 2015-03-17 MED ORDER — ACETAMINOPHEN 650 MG RE SUPP: 650.0000 mg | Freq: Four times a day (QID) | RECTAL | Status: DC | PRN

## 2015-03-17 MED ORDER — MORPHINE SULFATE (PF) 4 MG/ML IV SOLN
3.0000 mg | Freq: Once | INTRAVENOUS | Status: AC
  Administered 2015-03-17: 3 mg via INTRAVENOUS
  Filled 2015-03-17: qty 1

## 2015-03-17 MED ORDER — ACETAMINOPHEN 325 MG PO TABS: 650.0000 mg | ORAL_TABLET | Freq: Four times a day (QID) | ORAL | Status: DC | PRN

## 2015-03-17 MED ORDER — MORPHINE SULFATE (PF) 2 MG/ML IV SOLN
2.0000 mg | INTRAVENOUS | Status: DC | PRN
  Administered 2015-03-18 – 2015-03-20 (×3): 2 mg via INTRAVENOUS
  Filled 2015-03-17 (×3): qty 1

## 2015-03-17 MED ORDER — SODIUM CHLORIDE 0.9 % IV SOLN
INTRAVENOUS | Status: DC
  Administered 2015-03-17 – 2015-03-21 (×7): via INTRAVENOUS

## 2015-03-17 MED ORDER — ONDANSETRON HCL 4 MG/2ML IJ SOLN: 4.0000 mg | Freq: Four times a day (QID) | INTRAMUSCULAR | Status: DC | PRN

## 2015-03-17 MED ORDER — ONDANSETRON HCL 4 MG PO TABS: 4.0000 mg | ORAL_TABLET | Freq: Four times a day (QID) | ORAL | Status: DC | PRN

## 2015-03-17 MED ORDER — OXYCODONE HCL 5 MG PO TABS
5.0000 mg | ORAL_TABLET | ORAL | Status: DC | PRN
  Administered 2015-03-18 – 2015-03-21 (×9): 5 mg via ORAL
  Filled 2015-03-17 (×10): qty 1

## 2015-03-17 MED ORDER — IOHEXOL 300 MG/ML  SOLN
100.0000 mL | Freq: Once | INTRAMUSCULAR | Status: AC | PRN
  Administered 2015-03-17: 80 mL via INTRAVENOUS

## 2015-03-17 NOTE — ED Provider Notes (Signed)
[facility] Emergency Department Provider Note  REMINDER - THIS NOTE IS NOT A FINAL MEDICAL RECORD UNTIL IT IS SIGNED. UNTIL THEN, THE CONTENT BELOW MAY REFLECT INFORMATION FROM A DOCUMENTATION TEMPLATE, NOT THE ACTUAL PATIENT VISIT. ____________________________________________  Time seen: Approximately 7:14 PM  I have reviewed the triage vital signs and the nursing notes.   HISTORY  Chief Complaint Fall and Urinary Frequency    HPI Catherine Meyers is a 81 y.o. female 's history of arthritis, dementia. She has frequent falls according to family and caregiver.  The patient has a relatively unsteady gait, and today she had a fall. It was witnessed by her caretaker and she did not strike her head but fell onto her buttock. She got up and seemed to be doing well, however throughout the day she noticed increasing pain over the lower abdomen especially on the right. She does reportedly have some pain with walking, especially in the area of the right lower pelvis.  No numbness tingling or weakness. No nausea or vomiting. No fevers or chills. She doesn't history of frequent urinary tract infections, and has frequent incontinence.   Past Medical History  Diagnosis Date  . Migraine   . Hepatitis   . Hepatitis, water-borne 1965  . Arthritis     left knee  . Dementia   . Vitamin D deficiency disease   . Osteopenia   . Dementia     Patient Active Problem List   Diagnosis Date Noted  . History of recent fall 11/05/2014  . Do not resuscitate status 11/05/2014  . Dementia with behavioral disturbance   . Vitamin D deficiency disease   . Weight gain 09/02/2014  . Sprain of ankle 11/04/2013  . Fall at home 11/04/2013  . Acute renal failure (HCC) 11/04/2013  . Osteopenia of the elderly 11/04/2013  . Encounter for preventive health examination 06/02/2013  . Vascular dementia with depressed mood 05/30/2012  . Vitamin D deficiency 05/05/2012  . Depression 05/04/2012     Past Surgical History  Procedure Laterality Date  . Abdominal hysterectomy  1986  . Knee arthroscopy  2003    Current Outpatient Rx  Name  Route  Sig  Dispense  Refill  . alum & mag hydroxide-simeth (MAALOX/MYLANTA) 200-200-20 MG/5ML suspension   Oral   Take 15 mLs by mouth every 6 (six) hours as needed for indigestion or heartburn.         . bismuth subsalicylate (PEPTO BISMOL) 262 MG/15ML suspension   Oral   Take 30 mLs by mouth every 6 (six) hours as needed.         . Cranberry Fruit 405 MG CAPS      TAKE 1 CAPSULE BY MOUTH TWICE DAILY FOR URINARY HEALTH   60 each   PRN     TO CONTINUE THIS MEDICINE WE WILL NEED REFILLS AUT ...   . guaiFENesin (ROBITUSSIN) 100 MG/5ML liquid   Oral   Take 200 mg by mouth 3 (three) times daily as needed for cough.         . loperamide (IMODIUM) 2 MG capsule   Oral   Take by mouth as needed for diarrhea or loose stools.         Marland Kitchen LORazepam (ATIVAN) 0.5 MG tablet      TAKE 1 TABLET BY MOUTH TWICE DAILY FOR AGITATION   60 tablet   0   . magnesium hydroxide (MILK OF MAGNESIA) 400 MG/5ML suspension   Oral   Take 15  mLs by mouth daily as needed for mild constipation.         Marland Kitchen. MAPAP 500 MG tablet      TAKE 2 TABLETS (1000 MG) BY MOUTH TWICE DAILY AS NEEDED FOR DISCOMFORT OR FEVER   60 tablet   11   . omeprazole (PRILOSEC) 40 MG capsule      TAKE 1 CAPSULE BY MOUTH ONCE DAILY   90 capsule   1   . QUEtiapine (SEROQUEL) 50 MG tablet      TAKE 1 TABLET BY MOUTH 1 HOUR BEFORE BEDTIME DAILY   30 tablet   8   . Tdap (BOOSTRIX) 5-2.5-18.5 LF-MCG/0.5 injection   Intramuscular   Inject 0.5 mLs into the muscle once.   0.5 mL   0   . Vitamin D, Ergocalciferol, (DRISDOL) 50000 UNITS CAPS capsule      TAKE 1 CAPSULE BY MOUTH ONCE A WEEK FOR SUPPLEMENT   12 capsule   3   . zoster vaccine live, PF, (ZOSTAVAX) [id number] UNT/0.65ML injection   Subcutaneous   Inject 19,400 Units into the skin once.   1 each   0   .  zoster vaccine live, PF, (ZOSTAVAX) [id number] UNT/0.65ML injection   Subcutaneous   Inject 19,400 Units into the skin once.   1 each   0     Allergies Review of patient's allergies indicates no known allergies.  Family History  Problem Relation Age of Onset  . Cancer Sister     breast    Social History Social History  Substance Use Topics  . Smoking status: Never Smoker   . Smokeless tobacco: None  . Alcohol Use: No    Review of Systems Constitutional: No fever/chills Eyes: No visual changes. ENT: No sore throat. No neck pain. Did not strike her head or neck. Cardiovascular: Denies chest pain. Respiratory: Denies shortness of breath. Gastrointestinal: No abdominal pain over the right lower pelvis.  No nausea, no vomiting.  No diarrhea.  No constipation. Genitourinary: Negative for dysuria. Musculoskeletal: Negative for back pain. Skin: Negative for rash. Neurological: Negative for headaches, focal weakness or numbness.  10-point ROS otherwise negative.  ____________________________________________   PHYSICAL EXAM:  VITAL SIGNS: ED Triage Vitals  Enc Vitals Group     BP 03/17/15 1744 153/76 mmHg     Pulse Rate 03/17/15 1744 65     Resp 03/17/15 1744 18     Temp 03/17/15 1744 97.5 F (36.4 C)     Temp Source 03/17/15 1744 Oral     SpO2 03/17/15 1744 98 %     Weight 03/17/15 1744 150 lb (68.04 kg)     Height 03/17/15 1744 5\' 2"  (1.575 m)     Head Cir --      Peak Flow --      Pain Score 03/17/15 1744 6     Pain Loc --      Pain Edu? --      Excl. in GC? --    Constitutional: Alert and oriented. Appears in moderate discomfort, in no acute distress. Eyes: Conjunctivae are normal. PERRL. EOMI. Head: Atraumatic. Nose: No congestion/rhinnorhea. Mouth/Throat: Mucous membranes are moist.  Oropharynx non-erythematous. Neck: No stridor.  No cervical spine tenderness. Cardiovascular: Normal rate, regular rhythm. Grossly normal heart sounds.  Good peripheral  circulation. Respiratory: Normal respiratory effort.  No retractions. Lungs CTAB. Gastrointestinal: Soft and nontender except over the right lower anterior pelvis. No distention. No abdominal bruits. No CVA tenderness. Musculoskeletal: No lower extremity tenderness nor  edema except in the area of the right upper hip and anterior pelvis on the right. There is no shortening or deformity. Full range of motion without pain at the knees ankles and feet. No pain with axial loading in either hip. Normal dorsalis pedis pulses bilaterally.  No joint effusions. Neurologic:  Normal speech and language. No gross focal neurologic deficits are appreciated.Skin:  Skin is warm, dry and intact. No rash noted. Psychiatric: Mood and affect are normal. Speech and behavior are normal.  ____________________________________________   LABS (all labs ordered are listed, but only abnormal results are displayed)  Labs Reviewed  BASIC METABOLIC PANEL - Abnormal; Notable for the following:    Glucose, Bld 122 (*)    BUN 21 (*)    Creatinine, Ser 1.08 (*)    GFR calc non Af Amer 45 (*)    GFR calc Af Amer 53 (*)    All other components within normal limits  URINALYSIS COMPLETEWITH MICROSCOPIC ([facility] ONLY) - Abnormal; Notable for the following:    Color, Urine STRAW (*)    APPearance CLEAR (*)    Ketones, ur TRACE (*)    Squamous Epithelial / LPF 0-5 (*)    All other components within normal limits  CBC   ____________________________________________  EKG  Reviewed and interpreted by me at 1755 Total sinus rhythm Ventricular rate 70 QRS 70 PR 220 QTc 460 No significant T-wave abnormalities noted, Q wave noted in lead 3 No evidence of acute myocardial ischemia ____________________________________________  RADIOLOGY  IMPRESSION: 1. Nondisplaced fracture involving the anterior cortex of the right femoral neck, without definite extension across the underlying subcapital femoral neck. This subtle fracture  is best seen on sagittal series 7, images 36 through 39 and on axial series 2, image 80. 2. Colonic diverticulosis without evidence of acute diverticulitis. 3. Additional chronic/incidental findings detailed above. These results were called by telephone at the time of interpretation on 03/17/2015 at 10:14 pm to Dr. Sharyn Creamer , who verbally acknowledged these results. Per this discussion, the ER physician will consider MRI to exclude occult fracture across the femoral neck.  ____________________________________________   PROCEDURES  Procedure(s) performed: None  Critical Care performed: No  ____________________________________________   INITIAL IMPRESSION / ASSESSMENT AND PLAN / ED COURSE  Pertinent labs & imaging results that were available during my care of the patient were reviewed by me and considered in my medical decision making (see chart for details).  Patient presents for evaluation of pain over the right lower abdomen after falling today. She was initially able to walk, but had increasing pain throughout the day. No evidence of head injury or neck pain. No injury to the chest. Patient endorses lower abdominal pain, certainly worsening and made significant only worse with trying to walk through the evening.  Her exam at this time is reassuring, no clear evidence of muscle skeletal injury by exam. We will her x-rays, consider hip possible pelvic fracture or the alternative possibility of intra-abdominal etiology such as appendicitis, diverticulitis, kidney stone, etc.  ----------------------------------------- 9:18 PM on 03/17/2015 -----------------------------------------    Patient reports her pain is much better, on repeat exam she has persistent pain over the right lower pelvis. We will obtain CT imaging and continue throughout the right hip to evaluate for the presence of fracture or intra-abdominal process. Overall she appears well, much more comfortable and in no  distress.  Discussed CT with Dr. Rosita Kea, who advises admission to hospital. Discussed with Dr. Clint Guy will admit, Dr. Rosita Kea see  the patient consultation tomorrow. Updated family, patient reports pain is much better with second dose of morphine. Currently resting comfortably. No distress. Awake and alert. ____________________________________________   FINAL CLINICAL IMPRESSION(S) / ED DIAGNOSES  Final diagnoses:  Hip fracture, right, closed, initial encounter (HCC)      Sharyn Creamer, MD 03/17/15 2223

## 2015-03-17 NOTE — ED Notes (Addendum)
Son states pt fell today at her care home denies hitting her head, also states urinary frequency, pt grimicing and states abd pain
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
## 2015-03-18 ENCOUNTER — Inpatient Hospital Stay: Payer: Medicare Other

## 2015-03-18 ENCOUNTER — Encounter
Admission: EM | Disposition: A | Discharge status: Skilled Nursing Facility | Payer: Self-pay | Source: Home / Self Care | Attending: Internal Medicine

## 2015-03-18 ENCOUNTER — Encounter: Payer: Self-pay | Admitting: Anesthesiology

## 2015-03-18 ENCOUNTER — Inpatient Hospital Stay: Payer: Medicare Other | Admitting: Anesthesiology

## 2015-03-18 HISTORY — PX: HIP PINNING,CANNULATED: SHX1758

## 2015-03-18 LAB — CBC
HCT: 40.3 % (ref 35.0–47.0)
Hemoglobin: 13.2 g/dL (ref 12.0–16.0)
MCH: 30.4 pg (ref 26.0–34.0)
MCHC: 32.9 g/dL (ref 32.0–36.0)
MCV: 92.5 fL (ref 80.0–100.0)
PLATELETS: 241 10*3/uL (ref 150–440)
RBC: 4.35 MIL/uL (ref 3.80–5.20)
RDW: 14 % (ref 11.5–14.5)
WBC: 7.4 10*3/uL (ref 3.6–11.0)

## 2015-03-18 LAB — BASIC METABOLIC PANEL
ANION GAP: 5 (ref 5–15)
BUN: 21 mg/dL — ABNORMAL HIGH (ref 6–20)
CHLORIDE: 106 mmol/L (ref 101–111)
CO2: 28 mmol/L (ref 22–32)
CREATININE: 1.14 mg/dL — AB (ref 0.44–1.00)
Calcium: 8.7 mg/dL — ABNORMAL LOW (ref 8.9–10.3)
GFR calc non Af Amer: 43 mL/min — ABNORMAL LOW (ref >60)
GFR, EST AFRICAN AMERICAN: 49 mL/min — AB (ref >60)
Glucose, Bld: 139 mg/dL — ABNORMAL HIGH (ref 65–99)
Potassium: 4.2 mmol/L (ref 3.5–5.1)
SODIUM: 139 mmol/L (ref 135–145)

## 2015-03-18 LAB — MRSA PCR SCREENING: MRSA BY PCR: NEGATIVE

## 2015-03-18 SURGERY — FIXATION, FEMUR, NECK, PERCUTANEOUS, USING SCREW
Anesthesia: Spinal | Laterality: Right

## 2015-03-18 MED ORDER — METOCLOPRAMIDE HCL 5 MG PO TABS: 5.0000 mg | ORAL_TABLET | Freq: Three times a day (TID) | ORAL | Status: DC | PRN

## 2015-03-18 MED ORDER — MAGNESIUM HYDROXIDE 400 MG/5ML PO SUSP
15.0000 mL | Freq: Every day | ORAL | Status: DC | PRN
  Administered 2015-03-20: 15 mL via ORAL
  Filled 2015-03-18 (×2): qty 30

## 2015-03-18 MED ORDER — FENTANYL CITRATE (PF) 100 MCG/2ML IJ SOLN: 25.0000 ug | INTRAMUSCULAR | Status: DC | PRN

## 2015-03-18 MED ORDER — CEFAZOLIN (ANCEF) 1 G IV SOLR
1.0000 g | INTRAVENOUS | Status: DC
  Filled 2015-03-18 (×2): qty 1

## 2015-03-18 MED ORDER — DOCUSATE SODIUM 100 MG PO CAPS
100.0000 mg | ORAL_CAPSULE | Freq: Two times a day (BID) | ORAL | Status: DC
  Administered 2015-03-18 – 2015-03-21 (×6): 100 mg via ORAL
  Filled 2015-03-18 (×6): qty 1

## 2015-03-18 MED ORDER — FENTANYL CITRATE (PF) 100 MCG/2ML IJ SOLN
INTRAMUSCULAR | Status: DC | PRN
  Administered 2015-03-18: 50 ug via INTRAVENOUS

## 2015-03-18 MED ORDER — NEOMYCIN-POLYMYXIN B GU 40-200000 IR SOLN
Status: DC | PRN
  Administered 2015-03-18: 100 mL

## 2015-03-18 MED ORDER — ALUM & MAG HYDROXIDE-SIMETH 200-200-20 MG/5ML PO SUSP: 15.0000 mL | Freq: Four times a day (QID) | ORAL | Status: DC | PRN

## 2015-03-18 MED ORDER — BUPIVACAINE-EPINEPHRINE (PF) 0.5% -1:200000 IJ SOLN
INTRAMUSCULAR | Status: DC | PRN
  Administered 2015-03-18: 10 mL via PERINEURAL

## 2015-03-18 MED ORDER — PHENOL 1.4 % MT LIQD
1.0000 | OROMUCOSAL | Status: DC | PRN
  Filled 2015-03-18: qty 177

## 2015-03-18 MED ORDER — ALUM & MAG HYDROXIDE-SIMETH 200-200-20 MG/5ML PO SUSP
30.0000 mL | ORAL | Status: DC | PRN
  Administered 2015-03-18: 30 mL via ORAL
  Filled 2015-03-18: qty 30

## 2015-03-18 MED ORDER — PROPOFOL 500 MG/50ML IV EMUL
INTRAVENOUS | Status: DC | PRN
  Administered 2015-03-18: 50 ug/kg/min via INTRAVENOUS

## 2015-03-18 MED ORDER — PANTOPRAZOLE SODIUM 40 MG PO TBEC
40.0000 mg | DELAYED_RELEASE_TABLET | Freq: Every day | ORAL | Status: DC
  Administered 2015-03-19 – 2015-03-21 (×3): 40 mg via ORAL
  Filled 2015-03-18 (×3): qty 1

## 2015-03-18 MED ORDER — BISACODYL 10 MG RE SUPP
10.0000 mg | Freq: Every day | RECTAL | Status: DC | PRN
Start: 2015-03-18 — End: 2015-03-21
  Administered 2015-03-21: 10 mg via RECTAL
  Filled 2015-03-18: qty 1

## 2015-03-18 MED ORDER — ENOXAPARIN SODIUM 40 MG/0.4ML ~~LOC~~ SOLN
40.0000 mg | SUBCUTANEOUS | Status: DC
  Administered 2015-03-19 – 2015-03-21 (×3): 40 mg via SUBCUTANEOUS
  Filled 2015-03-18 (×2): qty 0.4

## 2015-03-18 MED ORDER — LORAZEPAM 0.5 MG PO TABS
0.5000 mg | ORAL_TABLET | Freq: Four times a day (QID) | ORAL | Status: DC | PRN
  Administered 2015-03-18: 0.5 mg via ORAL
  Filled 2015-03-18: qty 1

## 2015-03-18 MED ORDER — CEFAZOLIN SODIUM 1-5 GM-% IV SOLN
1.0000 g | Freq: Four times a day (QID) | INTRAVENOUS | Status: AC
  Administered 2015-03-18 – 2015-03-19 (×3): 1 g via INTRAVENOUS
  Filled 2015-03-18 (×4): qty 50

## 2015-03-18 MED ORDER — MENTHOL 3 MG MT LOZG
1.0000 | LOZENGE | OROMUCOSAL | Status: DC | PRN
Start: 2015-03-18 — End: 2015-03-21
  Filled 2015-03-18: qty 9

## 2015-03-18 MED ORDER — METOCLOPRAMIDE HCL 5 MG/ML IJ SOLN: 5.0000 mg | Freq: Three times a day (TID) | INTRAMUSCULAR | Status: DC | PRN

## 2015-03-18 MED ORDER — ONDANSETRON HCL 4 MG/2ML IJ SOLN: 4.0000 mg | Freq: Once | INTRAMUSCULAR | Status: DC | PRN

## 2015-03-18 MED ORDER — HYDROCODONE-ACETAMINOPHEN 5-325 MG PO TABS
1.0000 | ORAL_TABLET | Freq: Four times a day (QID) | ORAL | Status: DC | PRN
  Administered 2015-03-18: 1 via ORAL
  Administered 2015-03-20: 2 via ORAL
  Filled 2015-03-18: qty 1
  Filled 2015-03-18: qty 2

## 2015-03-18 MED ORDER — QUETIAPINE FUMARATE 25 MG PO TABS
50.0000 mg | ORAL_TABLET | Freq: Every day | ORAL | Status: DC
  Administered 2015-03-18 – 2015-03-20 (×3): 50 mg via ORAL
  Filled 2015-03-18 (×3): qty 2

## 2015-03-18 SURGICAL SUPPLY — 27 items
BIT DRILL CANN LRG QC 5X300 (BIT) ×3 IMPLANT
CANISTER SUCT 1200ML W/VALVE (MISCELLANEOUS) ×3 IMPLANT
CHLORAPREP W/TINT 26ML (MISCELLANEOUS) ×3 IMPLANT
DRSG TEGADERM 6X8 (GAUZE/BANDAGES/DRESSINGS) ×3 IMPLANT
GAUZE PETRO XEROFOAM 1X8 (MISCELLANEOUS) ×3 IMPLANT
GAUZE SPONGE 4X4 12PLY STRL (GAUZE/BANDAGES/DRESSINGS) ×3 IMPLANT
GLOVE BIOGEL PI IND STRL 9 (GLOVE) ×1 IMPLANT
GLOVE BIOGEL PI INDICATOR 9 (GLOVE) ×2
GLOVE SURG ORTHO 9.0 STRL STRW (GLOVE) ×3 IMPLANT
GOWN SPECIALTY ULTRA XL (MISCELLANEOUS) ×3 IMPLANT
GOWN STRL REUS W/ TWL LRG LVL3 (GOWN DISPOSABLE) ×1 IMPLANT
GOWN STRL REUS W/TWL LRG LVL3 (GOWN DISPOSABLE) ×2
KIT RM TURNOVER STRD PROC AR (KITS) ×3 IMPLANT
NEEDLE FILTER BLUNT 18X 1/2SAF (NEEDLE) ×2
NEEDLE FILTER BLUNT 18X1 1/2 (NEEDLE) ×1 IMPLANT
NS IRRIG 500ML POUR BTL (IV SOLUTION) ×3 IMPLANT
PACK HIP COMPR (MISCELLANEOUS) ×3 IMPLANT
PAD GROUND ADULT SPLIT (MISCELLANEOUS) ×3 IMPLANT
SCREW CANN 32 THRD/80 7.3 (Screw) ×3 IMPLANT
SCREW CANN 32 THRD/95 7.3 (Screw) ×3 IMPLANT
SCREW CANN THREADED 7.3X85 (Screw) ×3 IMPLANT
STAPLER SKIN PROX 35W (STAPLE) ×3 IMPLANT
SUT PROLENE 2 0 FS (SUTURE) ×3 IMPLANT
SUT VIC AB 2-0 SH 27 (SUTURE) ×2
SUT VIC AB 2-0 SH 27XBRD (SUTURE) ×1 IMPLANT
SYR 5ML LL (SYRINGE) ×3 IMPLANT
TAPE MICROFOAM 4IN (TAPE) ×3 IMPLANT

## 2015-03-18 NOTE — Transfer of Care (Signed)
Immediate Anesthesia Transfer of Care Note  Patient: Catherine Meyers  Procedure(s) Performed: Procedure(s): CANNULATED HIP PINNING (Right)  Patient Location: PACU  Anesthesia Type:Spinal  Level of Consciousness: awake and alert   Airway & Oxygen Therapy: Patient Spontanous Breathing and Patient connected to nasal cannula oxygen  Post-op Assessment: Report given to RN and Post -op Vital signs reviewed and stable  Post vital signs: Reviewed and stable  Last Vitals:  Filed Vitals:   03/18/15 0749 03/18/15 1153  BP: 131/55   Pulse: 71   Temp: 36.7 C 36.5 C  Resp: 18     Complications: No apparent anesthesia complications

## 2015-03-18 NOTE — Progress Notes (Signed)
Called Dr. Nemiah CommanderKalisetti paged about patients HR dropping into the 30's and a 4 sec pause.  She acknowledged and will round on patient in the morning.  Hold beta blockers if she has any ordered.

## 2015-03-18 NOTE — Transfer of Care (Signed)
Immediate Anesthesia Transfer of Care Note  Patient: Catherine Meyers  Procedure(s) Performed: Procedure(s): CANNULATED HIP PINNING (Right)  Patient Location: PACU  Anesthesia Type:General  Level of Consciousness: awake, alert  and oriented  Airway & Oxygen Therapy: Patient Spontanous Breathing and Patient connected to nasal cannula oxygen  Post-op Assessment: Report given to RN and Post -op Vital signs reviewed and stable  Post vital signs: Reviewed and stable  Last Vitals:  Filed Vitals:   03/18/15 0749 03/18/15 1153  BP: 131/55   Pulse: 71   Temp: 36.7 C 36.5 C  Resp: 18     Complications: No apparent anesthesia complications

## 2015-03-18 NOTE — Clinical Social Work Note (Signed)
Son and HCPOAClinical Social Work Assessment  Patient Details  Name: Catherine RoesFay R Bonifield MRN: [id number] Date of Birth: 1927/05/13  Date of referral:  03/18/15               Reason for consult:  Family Concerns, [facility], Other (Comment Required) (SNF Rehab placement)                Permission sought to share information with:  Case Manager Permission granted to share information::  Yes, Verbal Permission Granted  Name::     Vale HavenMichael Freeman Mol [phone] Son and HCPOA  Agency::  All SNF facility  Relationship::   Son and health care power of attorney  Contact Information:  Vale HavenMichael Freeman Earnest [phone]  Housing/Transportation Living arrangements for the past 2 months:  Assisted Living Facility (Above and beyond care [facility]( [facility])) Source of Information:  Patient, Adult Children Patient Interpreter Needed:  None Criminal Activity/Legal Involvement Pertinent to Current Situation/Hospitalization:  No - Comment as needed Significant Relationships:  Adult Children Lives with:  Facility Resident Do you feel safe going back to the place where you live?  Yes Need for family participation in patient care:  Yes (Comment)  Care giving concerns:  Needs a rehab family preference is [facility] [place]  Social Worker assessment / plan:  Complete assessment consult with Doctor and family,  Employment status:  Teacher, adult educationHome-Maker Insurance information:  Medicare, Other (Comment Required) ([employer]) PT Recommendations:  Inpatient Rehab Consult Information / Referral to community resources:  Acute Rehab, Skilled Nursing Facility  Patient/Family's Response to care:  Patient family [place]  Patient/Family's Understanding of and Emotional Response to Diagnosis, Current Treatment, and Prognosis: She is currently in an assisted living facility and needs a SNF rehab center  Emotional Assessment Appearance:  Appears stated age Attitude/Demeanor/Rapport:     Affect (typically observed):  Accepting, Happy, Appropriate, Calm, Pleasant Orientation:  Oriented to Self, Oriented to Place, Oriented to  Time, Fluctuating Orientation (Suspected and/or reported Sundowners) Alcohol / Substance use:  Never Used Psych involvement (Current and /or in the community):  No (Comment)  Discharge Needs  Concerns to be addressed:  No discharge needs identified Readmission within the last 30 days:  No Current discharge risk:  None Barriers to Discharge:  No Barriers Identified   Cheron SchaumannBandi, Kathalina Ostermann M, LCSW 03/18/2015, 4:17 PM

## 2015-03-18 NOTE — NC FL2 (Signed)
MEDICAID FL2 LEVEL OF CARE SCREENING TOOL     IDENTIFICATION  Patient Name: Catherine Meyers Birthdate: 1927-04-22 Sex: female Admission Date (Current Location): 03/17/2015  [facility] and [place] Number: Chiropodist and Address:  [facility], [address], [place], [place] [address]      Provider Number: [id number]  Attending Physician Name and Address:  Enid Baas, MD  Relative Name and Phone Number:  Taylie Helder [phone]    Current Level of Care: Hospital Recommended Level of Care: Skilled Nursing Facility Prior Approval Number:    Date Approved/Denied:   PASRR Number:    Discharge Plan: SNF    Current Diagnoses: Patient Active Problem List   Diagnosis Date Noted  . Femoral neck fracture, right, closed, initial encounter 03/17/2015  . History of recent fall 11/05/2014  . Do not resuscitate status 11/05/2014  . Dementia with behavioral disturbance   . Vitamin D deficiency disease   . Weight gain 09/02/2014  . Sprain of ankle 11/04/2013  . Fall at home 11/04/2013  . Acute renal failure (HCC) 11/04/2013  . Osteopenia of the elderly 11/04/2013  . Encounter for preventive health examination 06/02/2013  . Vascular dementia with depressed mood 05/30/2012  . Vitamin D deficiency 05/05/2012  . Depression 05/04/2012    Orientation RESPIRATION BLADDER Height & Weight    Self, Situation, Place  Normal Incontinent   167 lbs.  BEHAVIORAL SYMPTOMS/MOOD NEUROLOGICAL BOWEL NUTRITION STATUS      Incontinent Diet (Normal)  AMBULATORY STATUS COMMUNICATION OF NEEDS Skin   Extensive Assist Verbally Normal                       Personal Care Assistance Level of Assistance  Bathing, Dressing, Feeding Bathing Assistance: Maximum assistance Feeding assistance: Independent Dressing Assistance: Maximum assistance     Functional Limitations Info  Sight, Hearing, Speech Sight Info: Adequate Hearing Info:  Adequate Speech Info: Adequate    SPECIAL CARE FACTORS FREQUENCY  PT (By licensed PT)     PT Frequency: 5x              Contractures      Additional Factors Info                  Current Medications (03/18/2015):  This is the current hospital active medication list Current Facility-Administered Medications  Medication Dose Route Frequency Provider Last Rate Last Dose  . 0.9 %  sodium chloride infusion   Intravenous Continuous Wyatt Haste, MD 75 mL/hr at 03/18/15 1323    . acetaminophen (TYLENOL) tablet 650 mg  650 mg Oral Q6H PRN Wyatt Haste, MD       Or  . acetaminophen (TYLENOL) suppository 650 mg  650 mg Rectal Q6H PRN Wyatt Haste, MD      . alum & mag hydroxide-simeth (MAALOX/MYLANTA) 200-200-20 MG/5ML suspension 30 mL  30 mL Oral Q4H PRN Kennedy Bucker, MD      . bisacodyl (DULCOLAX) suppository 10 mg  10 mg Rectal Daily PRN Kennedy Bucker, MD      . ceFAZolin (ANCEF) IVPB 1 g/50 mL premix  1 g Intravenous Q6H Kennedy Bucker, MD   1 g at 03/18/15 1416  . docusate sodium (COLACE) capsule 100 mg  100 mg Oral BID Kennedy Bucker, MD      . Melene Muller ON 03/19/2015] enoxaparin (LOVENOX) injection 40 mg  40 mg Subcutaneous Q24H Kennedy Bucker, MD      .  HYDROcodone-acetaminophen (NORCO/VICODIN) 5-325 MG per tablet 1-2 tablet  1-2 tablet Oral Q6H PRN Kennedy BuckerMichael Menz, MD      . LORazepam (ATIVAN) tablet 0.5 mg  0.5 mg Oral Q6H PRN Wyatt Hasteavid K Hower, MD      . magnesium hydroxide (MILK OF MAGNESIA) suspension 15 mL  15 mL Oral Daily PRN Wyatt Hasteavid K Hower, MD      . menthol-cetylpyridinium (CEPACOL) lozenge 3 mg  1 lozenge Oral PRN Kennedy BuckerMichael Menz, MD       Or  . phenol (CHLORASEPTIC) mouth spray 1 spray  1 spray Mouth/Throat PRN Kennedy BuckerMichael Menz, MD      . metoCLOPramide (REGLAN) tablet 5-10 mg  5-10 mg Oral Q8H PRN Kennedy BuckerMichael Menz, MD       Or  . metoCLOPramide (REGLAN) injection 5-10 mg  5-10 mg Intravenous Q8H PRN Kennedy BuckerMichael Menz, MD      . morphine 2 MG/ML injection 2 mg  2 mg Intravenous Q4H PRN  Wyatt Hasteavid K Hower, MD      . ondansetron [facility](ZOFRAN) tablet 4 mg  4 mg Oral Q6H PRN Wyatt Hasteavid K Hower, MD       Or  . ondansetron [facility](ZOFRAN) injection 4 mg  4 mg Intravenous Q6H PRN Wyatt Hasteavid K Hower, MD      . oxyCODONE (Oxy IR/ROXICODONE) immediate release tablet 5 mg  5 mg Oral Q4H PRN Wyatt Hasteavid K Hower, MD      . pantoprazole (PROTONIX) EC tablet 40 mg  40 mg Oral Daily Wyatt Hasteavid K Hower, MD   40 mg at 03/18/15 1000  . QUEtiapine (SEROQUEL) tablet 50 mg  50 mg Oral QHS Wyatt Hasteavid K Hower, MD   50 mg at 03/18/15 0200     Discharge Medications: Please see discharge summary for a list of discharge medications.  Relevant Imaging Results:  Relevant Lab Results:   Additional Information SSN # [id number] Ellijah Leffel LCSW Clinical Social Worker  [place], [place], [place]

## 2015-03-18 NOTE — OR Nursing (Signed)
Patient has small dressing honeycomb dry and intact ice pack in place

## 2015-03-18 NOTE — Progress Notes (Signed)
[facility]icians - [place] at [facility]   PATIENT NAME: Catherine Meyers    MR#:  [id number]  DATE OF BIRTH:  [birth year]  SUBJECTIVE:  CHIEF COMPLAINT:   Chief Complaint  Patient presents with  . Fall  . Urinary Frequency   - Patient with osteoporosis and dementia admitted with fall and right hip fracture. Seen in the PACU. Denies any complaints. Appears pleasantly confused  REVIEW OF SYSTEMS:  Review of Systems  Constitutional: Negative for fever and chills.  Respiratory: Negative for cough, shortness of breath and wheezing.   Cardiovascular: Negative for chest pain and palpitations.  Gastrointestinal: Negative for nausea, vomiting, abdominal pain, diarrhea and constipation.  Genitourinary: Negative for dysuria.  Musculoskeletal: Positive for joint pain.       Right hip pain  Neurological: Negative for dizziness, seizures and headaches.    DRUG ALLERGIES:  No Known Allergies  VITALS:  Blood pressure 131/55, pulse 61, temperature 97.7 F (36.5 C), temperature source Tympanic, resp. rate 13, height  (1.575 m), weight 76.023 kg (167 lb 9.6 oz), SpO2 99 %.  PHYSICAL EXAMINATION:  Physical Exam  GENERAL:  81 y.o.-year-old patient lying in the bed with no acute distress.  EYES: Pupils equal, round, reactive to light and accommodation. No scleral icterus. Extraocular muscles intact.  HEENT: Head atraumatic, normocephalic. Oropharynx and nasopharynx clear.  NECK:  Supple, no jugular venous distention. No thyroid enlargement, no tenderness.  LUNGS: Normal breath sounds bilaterally, no wheezing, rales,rhonchi or crepitation. No use of accessory muscles of respiration. Decreased bibasilar breath sounds CARDIOVASCULAR: S1, S2 normal. No murmurs, rubs, or gallops.  ABDOMEN: Soft, nontender, nondistended. Bowel sounds present. No organomegaly or mass.  EXTREMITIES: No pedal edema, cyanosis, or clubbing. Right hip- laterally, dressing in place. NEUROLOGIC: Cranial  nerves II through XII are intact. Muscle strength 5/5 in all extremities except right leg due to pain. Sensation intact. Gait not checked.  PSYCHIATRIC: The patient is alert and oriented to self.  SKIN: No obvious rash, lesion, or ulcer.    LABORATORY PANEL:   CBC  Recent Labs Lab 03/18/15 0308  WBC 7.4  HGB 13.2  HCT 40.3  PLT 241   ------------------------------------------------------------------------------------------------------------------  Chemistries   Recent Labs Lab 03/18/15 0308  NA 139  K 4.2  CL 106  CO2 28  GLUCOSE 139*  BUN 21*  CREATININE 1.14*  CALCIUM 8.7*   ------------------------------------------------------------------------------------------------------------------  Cardiac Enzymes No results for input(s): TROPONINI in the last 168 hours. ------------------------------------------------------------------------------------------------------------------  RADIOLOGY:  Ct Abdomen Pelvis W Contrast  03/17/2015  CLINICAL DATA:  Fall today landing on right hip, severe pain. EXAM: CT ABDOMEN AND PELVIS WITH CONTRAST TECHNIQUE: Multidetector CT imaging of the abdomen and pelvis was performed using the standard protocol following bolus administration of intravenous contrast. CONTRAST:  80mL OMNIPAQUE IOHEXOL 300 MG/ML  SOLN COMPARISON:  None. FINDINGS: Patient is status post cholecystectomy. Liver slightly low in density throughout suggesting fatty infiltration. Spleen, pancreas, and adrenal glands are unremarkable. Kidneys are unremarkable without stone or hydronephrosis. Bowel is normal in caliber. Diverticulosis is seen throughout the colon, most prominent within the sigmoid colon, but no focal inflammatory change to suggest acute diverticulitis. No bowel wall thickening or evidence of bowel wall inflammation. Moderate-sized hiatal hernia noted. No free fluid or hemorrhage within the abdomen or pelvis. No free intraperitoneal air. Atherosclerotic changes are  seen along the walls of the normal-caliber abdominal aorta. No periaortic hemorrhage or edema. Scarring/fibrosis noted at each lung base. Degenerative changes are seen throughout  the thoracolumbar spine but no acute osseous abnormality. Anterolisthesis of L4 on L5 is likely related to the underlying degenerative changes and/or chronic unilateral pars interarticularis defect. No evidence of acute fracture or subluxation within the spine. There is a minimally displaced fracture involving the anterior cortex of the right femoral neck, subcapital region, but without definite extension across the underlying femoral neck. Right femoral head remains well positioned relative to the glenoid fossa peer IMPRESSION: 1. Nondisplaced fracture involving the anterior cortex of the right femoral neck, without definite extension across the underlying subcapital femoral neck. This subtle fracture is best seen on sagittal series 7, images 36 through 39 and on axial series 2, image 80. 2. Colonic diverticulosis without evidence of acute diverticulitis. 3. Additional chronic/incidental findings detailed above. These results were called by telephone at the time of interpretation on 03/17/2015 at 10:14 pm to Dr. Sharyn Creamer , who verbally acknowledged these results. Per this discussion, the ER physician will consider MRI to exclude occult fracture across the femoral neck. Electronically Signed   By: Bary Richard M.D.   On: 03/17/2015 22:15   Dg Hip Operative Unilat With Pelvis Right  03/18/2015  CLINICAL DATA:  Intraoperative right hip radiographs.  Hip fracture. EXAM: OPERATIVE right HIP (WITH PELVIS IF PERFORMED) 2 VIEWS TECHNIQUE: Fluoroscopic spot image(s) were submitted for interpretation post-operatively. COMPARISON:  None. FINDINGS: 3 cannulated screws are placed across the femoral neck. The hip is located. The visualized pelvis is intact. IMPRESSION: ORIF of the right hip without complication. Electronically Signed   By:  Marin Roberts M.D.   On: 03/18/2015 12:44   Dg Hip Unilat With Pelvis 2-3 Views Right  03/17/2015  CLINICAL DATA:  81 year old female with a history of fall. Right leg pain. EXAM: DG HIP (WITH OR WITHOUT PELVIS) 2-3V RIGHT COMPARISON:  09/14/2014 FINDINGS: Bony pelvic ring appears intact.  No acute fracture identified. Bilateral hips projects normally over the acetabula. Changes of bilateral osteoarthritis of the hips. Degenerative changes of the lower lumbar spine. IMPRESSION: Negative for acute bony abnormality. Signed, Yvone Neu. Loreta Ave, DO [facility] Electronically Signed   By: Gilmer Mor D.O.   On: 03/17/2015 19:48    EKG:   Orders placed or performed during the hospital encounter of 03/17/15  . EKG 12-Lead  . EKG 12-Lead    ASSESSMENT AND PLAN:   81y/o female with known history of migraines, dementia, osteoporosis from Assisted living facility presented to the hospital after a fall and noted to have right femoral neck fracture.  #1 right hip fracture-clear to by medicine for surgery last night. -Patient just had surgery, no complications noted. -Further management per orthopedics. -DVT prophylaxis will be started. Physical therapy from tomorrow. -Pain management and likely discharged to rehabilitation in 3 days  #2 dementia-pleasantly confused. Appears to be at baseline. -Continue her Seroquel - on ativan prn as well  #3 GERD-continue PPI  #4 DVT prophylaxis-started on Lovenox today   All the records are reviewed and case discussed with Care Management/Social Workerr. Management plans discussed with the patient, family and they are in agreement.  CODE STATUS: Full Code  TOTAL TIME TAKING CARE OF THIS PATIENT: 37 minutes.   POSSIBLE D/C IN 3 DAYS, DEPENDING ON CLINICAL CONDITION.   Enid Baas M.D on 03/18/2015 at 1:23 PM  Between 7am to 6pm - Pager - [phone]  After 6pm go to  [email or web address] - password [email or web address] [facility]  [place]  [facility]  Office  [phone]  CC: Primary  care physician; Sherlene ShamsULLO, TERESA L, MD

## 2015-03-18 NOTE — Anesthesia Preprocedure Evaluation (Addendum)
Anesthesia Evaluation  Patient identified by MRN, date of birth, ID band Patient awake    Reviewed: Allergy & Precautions, NPO status , Patient's Chart, lab work & pertinent test results  Airway Mallampati: II  TM Distance: >3 FB Neck ROM: Limited    Dental  (+) Chipped   Pulmonary neg pulmonary ROS,    Pulmonary exam normal breath sounds clear to auscultation       Cardiovascular + Peripheral Vascular Disease  Normal cardiovascular exam     Neuro/Psych  Headaches, Depression    GI/Hepatic (+) Hepatitis -, Unspecified  Endo/Other  negative endocrine ROS  Renal/GU Renal InsufficiencyRenal disease  negative genitourinary   Musculoskeletal  (+) Arthritis , Fx Hip   Abdominal Normal abdominal exam  (+)   Peds negative pediatric ROS (+)  Hematology negative hematology ROS (+)   Anesthesia Other Findings   Reproductive/Obstetrics                            Anesthesia Physical Anesthesia Plan  ASA: III and emergent  Anesthesia Plan: Spinal   Post-op Pain Management:    Induction: Intravenous  Airway Management Planned: Nasal Cannula  Additional Equipment:   Intra-op Plan:   Post-operative Plan:   Informed Consent: I have reviewed the patients History and Physical, chart, labs and discussed the procedure including the risks, benefits and alternatives for the proposed anesthesia with the patient or authorized representative who has indicated his/her understanding and acceptance.   Dental advisory given  Plan Discussed with: CRNA and Surgeon  Anesthesia Plan Comments:        Anesthesia Quick Evaluation

## 2015-03-18 NOTE — Op Note (Signed)
03/17/2015 - 03/18/2015  11:50 AM  PATIENT:  Catherine Meyers  81 y.o. female  PRE-OPERATIVE DIAGNOSIS:  right hip fracture, nondisplaced right femoral neck fracture  POST-OPERATIVE DIAGNOSIS:  same  PROCEDURE:  Procedure(s): CANNULATED HIP PINNING (Right)  SURGEON: Catherine SchullerMichael J Darrek Leasure, MD  ASSISTANTS: none  ANESTHESIA:   spinal  EBL:     BLOOD ADMINISTERED:none  DRAINS: none   LOCAL MEDICATIONS USED:  MARCAINE     SPECIMEN:  No Specimen  DISPOSITION OF SPECIMEN:  N/A  COUNTS:  YES  TOURNIQUET:  * No tourniquets in log *  IMSynthes 7.3 cannulated screws 3  DICTATION: .Dragon Dictation  patient was brought to the operating room and after adequate spinal anesthesia was obtained, patient was transferred to the fracture table where the left leg was placed in the well-leg holder right foot in the traction boot with no traction applied. C-arm was brought in and good visualization of the hip was obtained in both AP and lateral projections. After prepping and draping in sterile fashion appropriate patient identification and timeout procedures were completed 10 cc of half percent Sensorcaine were with epinephrine were infiltrated near the planned incision to minimize bleeding. Small incision was made over the lateral proximal thigh and soft tissue spread. 3 guidewires worse is inserted up across the neck into the head in a triangular array. These were measured drilled tapped and then 7.3 cannulated screws placed. Good visualization of the hip was obtained and there is no penetration of the screws. After obtaining permanent C-arm views the guidewires were removed the wound was thoroughly irrigated and closed with 2-0 Vicryl and skin staples honeycomb dressing applied. She was sent to recovery in stable condition   PLAN OF CARE: Continues as an inpatient  PATIENT DISPOSITION:  PACU - hemodynamically stable.

## 2015-03-18 NOTE — Anesthesia Procedure Notes (Addendum)
Date/Time: 03/18/2015 10:59 AM Performed by: Derinda LateIACONE, RONALD Pre-anesthesia Checklist: Timeout performed, Patient being monitored, Suction available, Emergency Drugs available and Patient identified Patient Re-evaluated:Patient Re-evaluated prior to inductionOxygen Delivery Method: Nasal cannula Preoxygenation: Pre-oxygenation with 100% oxygen   Spinal Patient location during procedure: OR Start time: 03/18/2015 11:00 AM End time: 03/18/2015 11:10 AM Staffing Anesthesiologist: Yves DillARROLL, Telma Pyeatt Performed by: anesthesiologist  Preanesthetic Checklist Completed: patient identified, site marked, surgical consent, pre-op evaluation, timeout performed, IV checked, risks and benefits discussed and monitors and equipment checked Spinal Block Patient position: sitting Prep: Betadine and site prepped and draped Patient monitoring: heart rate, cardiac monitor, continuous pulse ox and blood pressure Approach: midline Location: L3-4 Injection technique: single-shot Needle Needle type: Quincke  Needle gauge: 25 G Needle length: 9 cm Needle insertion depth: 4 cm Assessment Sensory level: T8 Additional Notes Time out called.  Patient relaxed and placed in sitting position.  Prepped and draped in sterile fashion.  Spinal as above.  Clear CSF in all 4 Quadrants.  13.5 mg of Marcaine injected ( plain ).  Patient tolerated the procedure well.

## 2015-03-18 NOTE — Consult Note (Signed)
Patient is an 81 year old who is having pain with weightbearing after a fall earlier in the day. She is normally an independent ambulator in the community. She reports living alone. She suffered a fall earlier and since then sent increasing groin pain on the right side. Evaluated in the emergency room with x-ray and then subsequent CT showing a nondisplaced femoral neck fracture.  On examination she has minimal pain with logrolling she does have some pain with pressure on the foot directed cephalad. She is able flex extend her toes and has palpable dorsalis pedis posterior tibial pulse with trace edema in the lower both lower extremities. There is no shortening or external rotation  Radiographic review shows x-rays which appeared normal and the start of a fracture of the femoral neck on CT.  Impression is incomplete right femoral neck fracture symptomatic  Recommendation is multiple pinning to prevent displacement and need for a much larger surgery if the femoral neck displaced. Discussed risks benefits possible complications with patient

## 2015-03-19 ENCOUNTER — Encounter: Payer: Self-pay | Admitting: Orthopedic Surgery

## 2015-03-19 LAB — CBC
HCT: 39.9 % (ref 35.0–47.0)
HEMOGLOBIN: 13.1 g/dL (ref 12.0–16.0)
MCH: 30.9 pg (ref 26.0–34.0)
MCHC: 32.8 g/dL (ref 32.0–36.0)
MCV: 94.1 fL (ref 80.0–100.0)
PLATELETS: 213 10*3/uL (ref 150–440)
RBC: 4.24 MIL/uL (ref 3.80–5.20)
RDW: 14.5 % (ref 11.5–14.5)
WBC: 8.2 10*3/uL (ref 3.6–11.0)

## 2015-03-19 LAB — BASIC METABOLIC PANEL
ANION GAP: 6 (ref 5–15)
BUN: 14 mg/dL (ref 6–20)
CHLORIDE: 106 mmol/L (ref 101–111)
CO2: 25 mmol/L (ref 22–32)
Calcium: 8.4 mg/dL — ABNORMAL LOW (ref 8.9–10.3)
Creatinine, Ser: 0.94 mg/dL (ref 0.44–1.00)
GFR calc Af Amer: >60 mL/min (ref >60)
GFR, EST NON AFRICAN AMERICAN: 54 mL/min — AB (ref >60)
Glucose, Bld: 144 mg/dL — ABNORMAL HIGH (ref 65–99)
POTASSIUM: 4 mmol/L (ref 3.5–5.1)
SODIUM: 137 mmol/L (ref 135–145)

## 2015-03-19 LAB — MAGNESIUM: MAGNESIUM: 2.2 mg/dL (ref 1.7–2.4)

## 2015-03-19 NOTE — Progress Notes (Signed)
Patient O2 sat decreased to 87 on RA. Applied 2L of O2 - 93% will continue to monitor.

## 2015-03-19 NOTE — Progress Notes (Signed)
Patient pleasantly confused. Denied any pain. Rested the entire shift after getting her prn ativan. Dressing dry and intact. Foley to be removed this morning.

## 2015-03-19 NOTE — Care Management Note (Addendum)
Case Management Note  Patient Details  Name: Catherine Meyers MRN: [id number] Date of Birth: 04-22-1927  Subjective/Objective:     81yo Catherine Meyers was admitted 03/17/15 after a fall at her assisted living facility [facility]. She received a right hip fracture pinning on 03/18/15. Current discharge plan is discharge to Rehab. OT and PT are recommending SNF. Family preference is [facility]. [facility]-Social work is following for discharge placement.                Action/Plan:   Expected Discharge Date:                  Expected Discharge Plan:     In-House Referral:     Discharge planning Services     Post Acute Care Choice:    Choice offered to:     DME Arranged:    DME Agency:     HH Arranged:    HH Agency:     Status of Service:     Medicare Important Message Given:  Yes Date Medicare IM Given:    Medicare IM give by:    Date Additional Medicare IM Given:    Additional Medicare Important Message give by:     If discussed at Long Length of Stay Meetings, dates discussed:    Additional Comments:  Jamee Pacholski A, RN 03/19/2015, 12:36 PM

## 2015-03-19 NOTE — Care Management Important Message (Signed)
Important Message  Patient Details  Name: Catherine Meyers MRN: [id number] Date of Birth: [birth year]   Medicare Important Message Given:  Yes    Theron Cumbie A, RN 03/19/2015, 7:53 AM

## 2015-03-19 NOTE — Progress Notes (Signed)
  Subjective: 1 Day Post-Op Procedure(s) (LRB): CANNULATED HIP PINNING (Right) Patient reports pain as mild.   Patient seen in rounds with Dr. Rosita KeaMenz. Patient is well, and has had no acute complaints or problems.  She was resting, but was able to arouse. No significant complaints. Plan is to go Skilled nursing facility after hospital stay. Negative for chest pain and shortness of breath Fever: no Gastrointestinal: Negative for nausea and vomiting  Objective: Vital signs in last 24 hours: Temp:  [97.5 F (36.4 C)-98.4 F (36.9 C)] 98.2 F (36.8 C) (12/19 0339) Pulse Rate:  [60-95] 95 (12/19 0339) Resp:  [12-22] 22 (12/19 0339) BP: (114-173)/(43-74) 114/51 mmHg (12/19 0339) SpO2:  [90 %-100 %] 92 % (12/19 0339) FiO2 (%):  [21 %] 21 % (12/18 1319)  Intake/Output from previous day:  Intake/Output Summary (Last 24 hours) at 03/19/15 0715 Last data filed at 03/19/15 0536  Gross per 24 hour  Intake 481.25 ml  Output   1550 ml  Net -1068.75 ml    Intake/Output this shift:    Labs:  Recent Labs  03/17/15 1752 03/18/15 0308 03/19/15 0348  HGB 14.1 13.2 13.1    Recent Labs  03/18/15 0308 03/19/15 0348  WBC 7.4 8.2  RBC 4.35 4.24  HCT 40.3 39.9  PLT 241 213    Recent Labs  03/18/15 0308 03/19/15 0348  NA 139 137  K 4.2 4.0  CL 106 106  CO2 28 25  BUN 21* 14  CREATININE 1.14* 0.94  GLUCOSE 139* 144*  CALCIUM 8.7* 8.4*   No results for input(s): LABPT, INR in the last 72 hours.   EXAM General - Patient is Alert and Confused Extremity - Sensation intact distally Dorsiflexion/Plantar flexion intact Compartment soft Dressing/Incision - clean, dry, no drainage Motor Function - intact, moving foot and toes well on exam.   Past Medical History  Diagnosis Date  . Migraine   . Hepatitis   . Hepatitis, water-borne 1965  . Arthritis     left knee  . Dementia   . Vitamin D deficiency disease   . Osteopenia   . Dementia     Assessment/Plan: 1 Day  Post-Op Procedure(s) (LRB): CANNULATED HIP PINNING (Right) Principal Problem:   Femoral neck fracture, right, closed, initial encounter  Estimated body mass index is 30.65 kg/(m^2) as calculated from the following:   Height as of this encounter: 5\' 2"  (1.575 m).   Weight as of this encounter: 76.023 kg (167 lb 9.6 oz). Advance diet Up with therapy  DVT Prophylaxis - Lovenox, Foot Pumps and TED hose Weight-Bearing as tolerated to right leg  Dedra Skeensodd Karanvir Balderston, PA-C Orthopaedic Surgery 03/19/2015, 7:15 AM

## 2015-03-19 NOTE — Progress Notes (Signed)
Physical Therapy Treatment Patient Details Name: Catherine Meyers MRN: [id number] DOB: 05-07-1927 Today's Date: 03/19/2015    History of Present Illness Pt is an 81 y.o. female with PMH of dementia, hepatitis, and osteoporosis presenting to hospital s/p mechanical fall and urinary frequency.  Pt found to have nondisplaced R femoral neck fx and s/p R cannulated hip pinning 03/18/15.    PT Comments    Pt still appearing with significant R hip pain with activity (grimacing noted with activity and with rest afterwards).  Pt attempting to sit prior to reaching sitting surface limiting distance ambulated and requiring increased assist for safety.  Plan to focus on bed mobility, transfers, increasing ambulation distance, and LE strengthening next sessions.  Continue to recommend pt discharge to STR.   Follow Up Recommendations  SNF     Equipment Recommendations       Recommendations for Other Services       Precautions / Restrictions Precautions Precautions: Fall Restrictions Weight Bearing Restrictions: Yes RLE Weight Bearing: Weight bearing as tolerated    Mobility  Bed Mobility Overal bed mobility: Needs Assistance Bed Mobility: Sit to Supine       Sit to supine: Mod assist;Max assist;+2 for physical assistance   General bed mobility comments: assist for trunk and B LE's d/t R hip pain  Transfers Overall transfer level: Needs assistance Equipment used: Rolling walker (2 wheeled) Transfers: Sit to/from [employer] Sit to Stand: Mod assist;+2 physical assistance Stand pivot transfers: Mod assist;Max assist;+2 physical assistance (transfer commode to bed)       General transfer comment: vc's required for hand and feet placement/technique  Ambulation/Gait Ambulation/Gait assistance: Mod assist;Max assist;+2 physical assistance Ambulation Distance (Feet):  (4 feet recliner to commode) Assistive device: Rolling walker (2 wheeled)   Gait velocity:  decreased   General Gait Details: antalgic; decreased stance time R LE; vc's for stepping technique and use of RW required; vc's not to sit early required   Stairs            Wheelchair Mobility    Modified Rankin (Stroke Patients Only)       Balance                                    Cognition Arousal/Alertness: Awake/alert Behavior During Therapy: WFL for tasks assessed/performed Overall Cognitive Status: History of cognitive impairments - at baseline       Memory: Decreased recall of precautions              Exercises      General Comments   Nursing cleared pt for participation in physical therapy.  Pt agreeable to PT session. Discussed PT POC and progress with pt's son: pt's son appearing with good understanding.      Pertinent Vitals/Pain Pain Assessment: Faces Faces Pain Scale: Hurts whole lot Pain Location: R hip Pain Descriptors / Indicators: Sore;Tender;Operative site guarding Pain Intervention(s): Limited activity within patient's tolerance;Monitored during session;Premedicated before session;Repositioned  Vitals stable and WFL throughout treatment session.    Home Living                      Prior Function            PT Goals (current goals can now be found in the care plan section) Acute Rehab PT Goals Patient Stated Goal: to have less pain PT Goal Formulation: With patient Time For  Goal Achievement: 04/02/15 Potential to Achieve Goals: Fair Progress towards PT goals: Progressing toward goals    Frequency  BID    PT Plan Current plan remains appropriate    Co-evaluation             End of Session Equipment Utilized During Treatment: Gait belt Activity Tolerance: Patient limited by pain Patient left: in bed;with call bell/phone within reach;with bed alarm set;with family/visitor present;with SCD's reapplied     Time: [id number] PT Time Calculation (min) (ACUTE ONLY): 23 min  Charges:   $Therapeutic Activity: 23-37 mins                    G CodesHendricks Limes:      Lucero Auzenne 03/19/2015, 5:02 PM Hendricks LimesEmily Jayquan Bradsher, PT [phone]

## 2015-03-19 NOTE — Evaluation (Signed)
Physical Therapy Evaluation Patient Details Name: Catherine Meyers MRN: [id number] DOB: Nov 02, 1927 Today's Date: 03/19/2015   History of Present Illness  Pt is an 81 y.o. female with PMH of dementia, hepatitis, and osteoporosis presenting to hospital s/p mechanical fall and urinary frequency.  Pt found to have nondisplaced R femoral neck fx and s/p R cannulated hip pinning 03/18/15.  Clinical Impression  Pt did not verbalize PLOF or any AD use prior to admission when asked but per notes pt appears to be ambulatory with walker.  Pt lives at ALF per notes.  Currently pt requires 2 assist with bed mobility, transfers, and taking a few steps bed to recliner with RW.  Activity limited by pain but pt willing to participate in therapy activities.  Pt would benefit from skilled PT to address noted impairments and functional limitations.  Recommend pt discharge to STR when medically appropriate.     Follow Up Recommendations SNF    Equipment Recommendations       Recommendations for Other Services       Precautions / Restrictions Precautions Precautions: Fall Restrictions Weight Bearing Restrictions: Yes RLE Weight Bearing: Weight bearing as tolerated      Mobility  Bed Mobility Overal bed mobility: Needs Assistance Bed Mobility: Supine to Sit     Supine to sit: Mod to max assist;+2 for physical assistance     General bed mobility comments: assist for trunk and B LE's d/t R hip pain  Transfers Overall transfer level: Needs assistance Equipment used: Rolling walker (2 wheeled) Transfers: Sit to/from Stand Sit to Stand: Mod assist;+2 physical assistance         General transfer comment: vc's required for hand and feet placement/technique  Ambulation/Gait Ambulation/Gait assistance: Min assist;Mod assist;+2 physical assistance Ambulation Distance (Feet): 3 Feet (bed to recliner) Assistive device: Rolling walker (2 wheeled)   Gait velocity: decreased   General Gait Details:  antalgic; decreased stance time R LE; vc's for stepping technique and use of RW required  Stairs            Wheelchair Mobility    Modified Rankin (Stroke Patients Only)       Balance Overall balance assessment: Needs assistance Sitting-balance support: Bilateral upper extremity supported;Feet supported Sitting balance-Leahy Scale: Fair Sitting balance - Comments: Fair balance once set up in sitting (initial posterior lean)   Standing balance support: Bilateral upper extremity supported (on RW) Standing balance-Leahy Scale: Poor                               Pertinent Vitals/Pain Pain Assessment: Faces Faces Pain Scale: Hurts whole lot Pain Location: R hip Pain Descriptors / Indicators: Sore;Tender;Operative site guarding Pain Intervention(s): Limited activity within patient's tolerance;Monitored during session;Premedicated before session;Repositioned;Patient requesting pain meds-RN notified (NA notified and bringing pt ice)  Vitals stable and WFL throughout treatment session on 1 L/min via nasal cannula.    Home Living Family/patient expects to be discharged to:: Assisted living                 Additional Comments: Per notes pt used walker but pt did not answer any DME questions during eval    Prior Function           Comments: Pt did not answer any PLOF questions (per notes, pt appears to be ambulatory with walker)     Hand Dominance        Extremity/Trunk Assessment   Upper  Extremity Assessment: Generalized weakness           Lower Extremity Assessment: RLE deficits/detail;LLE deficits/detail RLE Deficits / Details: R hip flexion at least 2+/5; R knee flexion/extension 2+/5; R DF 3+/5 at least LLE Deficits / Details: L LE appears with generalized weakness     Communication   Communication: HOH  Cognition Arousal/Alertness: Awake/alert Behavior During Therapy: WFL for tasks assessed/performed Overall Cognitive Status: No  family/caregiver present to determine baseline cognitive functioning (Pt did not answer any orientation questions so difficult to assess)       Memory: Decreased recall of precautions              General Comments General comments (skin integrity, edema, etc.): mild drainage noted in R LE honeycomb dressing  Nursing cleared pt for participation in physical therapy.  Pt agreeable to PT session.    Exercises   Performed semi-supine B LE therapeutic exercise x 10 reps:  Ankle pumps (AAROM B LE's);; SAQ's (AAROM R; AAROM L); heelslides (AAROM R; AAROM L), hip abd/adduction (AAROM R; AAROM L).  Pt required vc's and tactile cues for correct technique with exercises.       Assessment/Plan    PT Assessment Patient needs continued PT services  PT Diagnosis Difficulty walking;Acute pain   PT Problem List Decreased strength;Decreased activity tolerance;Decreased balance;Decreased mobility;Decreased knowledge of use of DME;Decreased knowledge of precautions;Pain  PT Treatment Interventions DME instruction;Gait training;Functional mobility training;Therapeutic activities;Therapeutic exercise;Balance training;Patient/family education;Manual techniques   PT Goals (Current goals can be found in the Care Plan section) Acute Rehab PT Goals Patient Stated Goal: to have less pain PT Goal Formulation: With patient Time For Goal Achievement: 04/02/15 Potential to Achieve Goals: Fair    Frequency BID   Barriers to discharge Decreased caregiver support      Co-evaluation               End of Session Equipment Utilized During Treatment: Gait belt;Oxygen Activity Tolerance: Patient limited by pain Patient left: in chair;with call bell/phone within reach;with chair alarm set;with nursing/sitter in room;with SCD's reapplied (B heels elevated via pillow) Nurse Communication: Mobility status;Patient requests pain meds;Precautions;Weight bearing status         Time: [id number] PT Time  Calculation (min) (ACUTE ONLY): 27 min   Charges:   PT Evaluation $Initial PT Evaluation Tier I: 1 Procedure PT Treatments $Therapeutic Exercise: 8-22 mins   PT G CodesHendricks Meyers:        Catherine Meyers 03/19/2015, 10:11 AM Catherine LimesEmily Charvi Meyers, PT [phone]

## 2015-03-19 NOTE — Anesthesia Postprocedure Evaluation (Signed)
Anesthesia Post Note  Patient: Catherine Meyers  Procedure(s) Performed: Procedure(s) (LRB): CANNULATED HIP PINNING (Right)  Patient location during evaluation: Nursing Unit Anesthesia Type: Spinal Level of consciousness: confused and sedated Pain management: pain level controlled Vital Signs Assessment: post-procedure vital signs reviewed and stable Respiratory status: spontaneous breathing, respiratory function stable and patient connected to nasal cannula oxygen Cardiovascular status: blood pressure returned to baseline and stable Postop Assessment: no headache, no signs of nausea or vomiting and spinal receding Anesthetic complications: no    Last Vitals:  Filed Vitals:   03/18/15 1936 03/19/15 0339  BP: 147/63 114/51  Pulse: 77 95  Temp: 36.9 C 36.8 C  Resp: 17 22    Last Pain:  Filed Vitals:   03/19/15 0553  PainSc: 8                  Ferrero-Conover,  Ezzard Standingeborah J

## 2015-03-19 NOTE — Progress Notes (Signed)
[facility] - [place] [facility]   PATIENT NAME: Catherine Meyers    MR#:  [id number]  DATE OF BIRTH:  17-Sep-1927  SUBJECTIVE:  CHIEF COMPLAINT:   Chief Complaint  Patient presents with  . Fall  . Urinary Frequency   - Pleasantly confused from her dementia, appears to be in pain from right hip as she was grimacing.  REVIEW OF SYSTEMS:  Review of Systems  Constitutional: Negative for fever and chills.  Respiratory: Negative for cough, shortness of breath and wheezing.   Cardiovascular: Negative for chest pain and palpitations.  Gastrointestinal: Negative for nausea, vomiting, abdominal pain, diarrhea and constipation.  Genitourinary: Negative for dysuria.  Musculoskeletal: Positive for joint pain.       Right hip pain  Neurological: Negative for dizziness, seizures and headaches.    DRUG ALLERGIES:  No Known Allergies  VITALS:  Blood pressure 159/72, pulse 80, temperature 98.1 F (36.7 C), temperature source Oral, resp. rate 17, height  (1.575 m), weight 76.023 kg (167 lb 9.6 oz), SpO2 92 %.  PHYSICAL EXAMINATION:  Physical Exam  GENERAL:  81 y.o.-year-old patient lying in the bed with no acute distress.  EYES: Pupils equal, round, reactive to light and accommodation. No scleral icterus. Extraocular muscles intact.  HEENT: Head atraumatic, normocephalic. Oropharynx and nasopharynx clear.  NECK:  Supple, no jugular venous distention. No thyroid enlargement, no tenderness.  LUNGS: Normal breath sounds bilaterally, no wheezing, rales,rhonchi or crepitation. No use of accessory muscles of respiration. Decreased bibasilar breath sounds CARDIOVASCULAR: S1, S2 normal. No murmurs, rubs, or gallops.  ABDOMEN: Soft, nontender, nondistended. Bowel sounds present. No organomegaly or mass.  EXTREMITIES: No pedal edema, cyanosis, or clubbing. Right hip- laterally, dressing in place. NEUROLOGIC: Cranial nerves II through XII are intact. Muscle strength 5/5 in  all extremities except right leg due to pain. Sensation intact. Gait not checked.  PSYCHIATRIC: The patient is alert and oriented to self.  SKIN: No obvious rash, lesion, or ulcer.    LABORATORY PANEL:   CBC  Recent Labs Lab 03/19/15 0348  WBC 8.2  HGB 13.1  HCT 39.9  PLT 213   ------------------------------------------------------------------------------------------------------------------  Chemistries   Recent Labs Lab 03/19/15 0348  NA 137  K 4.0  CL 106  CO2 25  GLUCOSE 144*  BUN 14  CREATININE 0.94  CALCIUM 8.4*  MG 2.2   ------------------------------------------------------------------------------------------------------------------  Cardiac Enzymes No results for input(s): TROPONINI in the last 168 hours. ------------------------------------------------------------------------------------------------------------------  RADIOLOGY:  Ct Abdomen Pelvis W Contrast  03/17/2015  CLINICAL DATA:  Fall today landing on right hip, severe pain. EXAM: CT ABDOMEN AND PELVIS WITH CONTRAST TECHNIQUE: Multidetector CT imaging of the abdomen and pelvis was performed using the standard protocol following bolus administration of intravenous contrast. CONTRAST:  80mL OMNIPAQUE IOHEXOL 300 MG/ML  SOLN COMPARISON:  None. FINDINGS: Patient is status post cholecystectomy. Liver slightly low in density throughout suggesting fatty infiltration. Spleen, pancreas, and adrenal glands are unremarkable. Kidneys are unremarkable without stone or hydronephrosis. Bowel is normal in caliber. Diverticulosis is seen throughout the colon, most prominent within the sigmoid colon, but no focal inflammatory change to suggest acute diverticulitis. No bowel wall thickening or evidence of bowel wall inflammation. Moderate-sized hiatal hernia noted. No free fluid or hemorrhage within the abdomen or pelvis. No free intraperitoneal air. Atherosclerotic changes are seen along the walls of the normal-caliber  abdominal aorta. No periaortic hemorrhage or edema. Scarring/fibrosis noted at each lung base. Degenerative changes are seen throughout the thoracolumbar  spine but no acute osseous abnormality. Anterolisthesis of L4 on L5 is likely related to the underlying degenerative changes and/or chronic unilateral pars interarticularis defect. No evidence of acute fracture or subluxation within the spine. There is a minimally displaced fracture involving the anterior cortex of the right femoral neck, subcapital region, but without definite extension across the underlying femoral neck. Right femoral head remains well positioned relative to the glenoid fossa peer IMPRESSION: 1. Nondisplaced fracture involving the anterior cortex of the right femoral neck, without definite extension across the underlying subcapital femoral neck. This subtle fracture is best seen on sagittal series 7, images 36 through 39 and on axial series 2, image 80. 2. Colonic diverticulosis without evidence of acute diverticulitis. 3. Additional chronic/incidental findings detailed above. These results were called by telephone at the time of interpretation on 03/17/2015 at 10:14 pm to Dr. Sharyn CreamerMARK QUALE , who verbally acknowledged these results. Per this discussion, the ER physician will consider MRI to exclude occult fracture across the femoral neck. Electronically Signed   By: Bary RichardStan  Maynard M.D.   On: 03/17/2015 22:15   Dg Hip Operative Unilat With Pelvis Right  03/18/2015  CLINICAL DATA:  Intraoperative right hip radiographs.  Hip fracture. EXAM: OPERATIVE right HIP (WITH PELVIS IF PERFORMED) 2 VIEWS TECHNIQUE: Fluoroscopic spot image(s) were submitted for interpretation post-operatively. COMPARISON:  None. FINDINGS: 3 cannulated screws are placed across the femoral neck. The hip is located. The visualized pelvis is intact. IMPRESSION: ORIF of the right hip without complication. Electronically Signed   By: Marin Robertshristopher  Mattern M.D.   On: 03/18/2015 12:44    Dg Hip Unilat With Pelvis 2-3 Views Right  03/17/2015  CLINICAL DATA:  81 year old female with a history of fall. Right leg pain. EXAM: DG HIP (WITH OR WITHOUT PELVIS) 2-3V RIGHT COMPARISON:  09/14/2014 FINDINGS: Bony pelvic ring appears intact.  No acute fracture identified. Bilateral hips projects normally over the acetabula. Changes of bilateral osteoarthritis of the hips. Degenerative changes of the lower lumbar spine. IMPRESSION: Negative for acute bony abnormality. Signed, Yvone NeuJaime S. Loreta AveWagner, DO [facility] Electronically Signed   By: Gilmer MorJaime  Wagner D.O.   On: 03/17/2015 19:48    EKG:   Orders placed or performed during the hospital encounter of 03/17/15  . EKG 12-Lead  . EKG 12-Lead    ASSESSMENT AND PLAN:   81y/o female with known history of migraines, dementia, osteoporosis from Assisted living facility presented to the hospital after a fall and noted to have right femoral neck fracture.  #1 Right hip fracture-.from fall. POD#1 -no complications noted. -Further management per orthopedics. -pain control and physical therapy -Pain management and likely discharged to rehabilitation in 2-3 days  #2 dementia-pleasantly confused. Appears to be at baseline. -Continue her Seroquel - on ativan prn as well  #3 GERD-continue PPI  #4 DVT prophylaxis-on Lovenox    All the records are reviewed and case discussed with Care Management/Social Workerr. Management plans discussed with the patient, family and they are in agreement.  CODE STATUS: DNR  TOTAL TIME TAKING CARE OF THIS PATIENT: 37 minutes.   POSSIBLE D/C IN 2-3 DAYS, DEPENDING ON CLINICAL CONDITION.   Enid BaasKALISETTI,Mikaella Escalona M.D on 03/19/2015 at 2:10 PM  Between 7am to 6pm - Pager - [phone]  After 6pm go to [email or web address] - password [email or web address] [facility]  [facility]  Office  [phone]  CC: Primary care physician; Sherlene ShamsULLO, TERESA L, MD

## 2015-03-19 NOTE — Evaluation (Signed)
Occupational Therapy Evaluation Patient Details Name: Catherine Meyers MRN: [id number] DOB: [birth year] Today's Date: 03/19/2015    History of Present Illness This patient  is an 81 year old female with history of dementia, hepatitis, and osteoporosis who [facility] after a mechanical fall resulting in a  nondisplaced R femoral neck fx and s/p R cannulated hip pinning 03/18/15.   Clinical Impression   This patient is an 81 year old female with the above history. Patient very pleasant during evaluation but only is oriented to self. She lives in assisted living facility ([facility]). One of her care givers is present during this evaluation and reports she needed assist for dressing, bathing and needed reminding for toileting. She did walk with a rolling walker. She would benefit from Occupational Therapy for care giver instructions and functional mobility training.     Follow Up Recommendations  SNF    Equipment Recommendations       Recommendations for Other Services       Precautions / Restrictions Precautions Precautions: Fall Restrictions Weight Bearing Restrictions: Yes RLE Weight Bearing: Weight bearing as tolerated      Mobility Bed Mobility  Transfers    Balance                               ADL                                         General ADL Comments: Patient had been getting assist with dressing, bathing and toileting with one, now needing max assist of 2 per PT for mobility. Instructed care giver in amount of assist, and that she has no limits with regards to weight bearing or position of LE during ADL.     Vision     Perception     Praxis      Pertinent Vitals/Pain Reports no pain at first, then when pointing to hip, patient reports it hurts all over.     Hand Dominance     Extremity/Trunk Assessment Upper Extremity Assessment Upper Extremity Assessment: Generalized weakness (3+/5  B UE)         Communication Communication Communication: No difficulties   Cognition Arousal/Alertness: Awake/alert Behavior During Therapy: WFL for tasks assessed/performed Overall Cognitive Status: History of cognitive impairments - at baseline       Memory: Decreased recall of precautions             General Comments       Exercises       Shoulder Instructions      Home Living Family/patient expects to be discharged to:: Assisted living                                 Additional Comments: care giver present and reports she uses walker.      Prior Functioning/Environment          Comments: Care giver reports she gets assist for dressing, bathing and needs to be taken to bathroom as she is incontinent. She does feed herself.    OT Diagnosis: Generalized weakness;Acute pain   OT Problem List: Decreased strength;Impaired balance (sitting and/or standing);Decreased activity tolerance;Decreased cognition;Decreased safety awareness   OT Treatment/Interventions: Self-care/ADL training    OT Goals(Current  goals can be found in the care plan section) Acute Rehab OT Goals Patient Stated Goal: non stated OT Goal Formulation:  (care giver from facility) Time For Goal Achievement: 04/02/15 Potential to Achieve Goals: Good  OT Frequency: Min 1X/week   Barriers to D/C:            Co-evaluation              End of Session    Activity Tolerance:   Patient left: in chair;with call bell/phone within reach;with chair alarm set (with care givers from assisted living.)   Time: 0865-7846 OT Time Calculation (min): 22 min Charges:  OT General Charges $OT Visit: 1 Procedure OT Evaluation $Initial OT Evaluation Tier I: 1 Procedure OT Treatments $Self Care/Home Management : 8-22 mins G-Codes:    Gwyndolyn Kaufman, MS/OTR/L  03/19/2015, 11:21 AM

## 2015-03-20 LAB — CBC
HCT: 39.5 % (ref 35.0–47.0)
Hemoglobin: 13.2 g/dL (ref 12.0–16.0)
MCH: 31.3 pg (ref 26.0–34.0)
MCHC: 33.6 g/dL (ref 32.0–36.0)
MCV: 93.3 fL (ref 80.0–100.0)
PLATELETS: 184 10*3/uL (ref 150–440)
RBC: 4.23 MIL/uL (ref 3.80–5.20)
RDW: 14.2 % (ref 11.5–14.5)
WBC: 8.7 10*3/uL (ref 3.6–11.0)

## 2015-03-20 LAB — BASIC METABOLIC PANEL
Anion gap: 4 — ABNORMAL LOW (ref 5–15)
BUN: 13 mg/dL (ref 6–20)
CO2: 27 mmol/L (ref 22–32)
CREATININE: 0.97 mg/dL (ref 0.44–1.00)
Calcium: 8.3 mg/dL — ABNORMAL LOW (ref 8.9–10.3)
Chloride: 107 mmol/L (ref 101–111)
GFR, EST NON AFRICAN AMERICAN: 52 mL/min — AB (ref >60)
Glucose, Bld: 144 mg/dL — ABNORMAL HIGH (ref 65–99)
POTASSIUM: 4 mmol/L (ref 3.5–5.1)
SODIUM: 138 mmol/L (ref 135–145)

## 2015-03-20 MED ORDER — ENOXAPARIN SODIUM 40 MG/0.4ML ~~LOC~~ SOLN: 40.0000 mg | SUBCUTANEOUS | Status: DC

## 2015-03-20 MED ORDER — OXYCODONE HCL 5 MG PO TABS: 5.0000 mg | ORAL_TABLET | ORAL | Status: DC | PRN

## 2015-03-20 NOTE — Progress Notes (Signed)
[facility] - [place] [facility]   PATIENT NAME: Catherine ScrapeFay Brannigan    MR#:  [id number]  DATE OF BIRTH:  [birth year]  SUBJECTIVE:  CHIEF COMPLAINT:   Chief Complaint  Patient presents with  . Fall  . Urinary Frequency   - Patient sitting in a chair this morning. States that her right hip pain is better. She is pleasantly confused from her dementia. -Labs and vitals are stable. Possible discharge to rehabilitation tomorrow.  REVIEW OF SYSTEMS:  Review of Systems  Constitutional: Negative for fever and chills.  Respiratory: Negative for cough, shortness of breath and wheezing.   Cardiovascular: Negative for chest pain and palpitations.  Gastrointestinal: Negative for nausea, vomiting, abdominal pain, diarrhea and constipation.  Genitourinary: Negative for dysuria.  Musculoskeletal: Positive for joint pain.       Right hip pain  Neurological: Negative for dizziness, seizures and headaches.    DRUG ALLERGIES:  No Known Allergies  VITALS:  Blood pressure 134/60, pulse 85, temperature 98.1 F (36.7 C), temperature source Oral, resp. rate 20, height 5\' 2"  (1.575 m), weight 76.023 kg (167 lb 9.6 oz), SpO2 92 %.  PHYSICAL EXAMINATION:  Physical Exam  GENERAL:  81 y.o.-year-old patient sitting in the chair with no acute distress.  EYES: Pupils equal, round, reactive to light and accommodation. No scleral icterus. Extraocular muscles intact.  HEENT: Head atraumatic, normocephalic. Oropharynx and nasopharynx clear.  NECK:  Supple, no jugular venous distention. No thyroid enlargement, no tenderness.  LUNGS: Normal breath sounds bilaterally, no wheezing, rales,rhonchi or crepitation. No use of accessory muscles of respiration. Decreased bibasilar breath sounds CARDIOVASCULAR: S1, S2 normal. No murmurs, rubs, or gallops.  ABDOMEN: Soft, nontender, nondistended. Bowel sounds present. No organomegaly or mass.  EXTREMITIES: No pedal edema, cyanosis, or clubbing. Right  hip- laterally, dressing in place. NEUROLOGIC: Cranial nerves II through XII are intact. Muscle strength 5/5 in all extremities but limited in right leg due to pain. Sensation intact. Gait not checked.  PSYCHIATRIC: The patient is alert and oriented to self.  SKIN: No obvious rash, lesion, or ulcer.    LABORATORY PANEL:   CBC  Recent Labs Lab 03/20/15 0459  WBC 8.7  HGB 13.2  HCT 39.5  PLT 184   ------------------------------------------------------------------------------------------------------------------  Chemistries   Recent Labs Lab 03/19/15 0348 03/20/15 0459  NA 137 138  K 4.0 4.0  CL 106 107  CO2 25 27  GLUCOSE 144* 144*  BUN 14 13  CREATININE 0.94 0.97  CALCIUM 8.4* 8.3*  MG 2.2  --    ------------------------------------------------------------------------------------------------------------------  Cardiac Enzymes No results for input(s): TROPONINI in the last 168 hours. ------------------------------------------------------------------------------------------------------------------  RADIOLOGY:  No results found.  EKG:   Orders placed or performed during the hospital encounter of 03/17/15  . EKG 12-Lead  . EKG 12-Lead    ASSESSMENT AND PLAN:   81y/o female with known history of migraines, dementia, osteoporosis from Assisted living facility presented to the hospital after a fall and noted to have right femoral neck fracture.  #1 Right hip fracture-.from fall. POD#2 -no complications noted. -Further management per orthopedics. -pain control and physical therapy -Pain management and likely discharged to rehabilitation tomorrow  #2 dementia-pleasantly confused. Appears to be at baseline. -Continue her Seroquel - on ativan prn as well  #3 GERD-continue PPI  #4 DVT prophylaxis-on Lovenox    All the records are reviewed and case discussed with Care Management/Social Workerr. Management plans discussed with the patient, family and they are  in agreement.  CODE STATUS: DNR  TOTAL TIME TAKING CARE OF THIS PATIENT: 37 minutes.   POSSIBLE D/C TOMORROW, DEPENDING ON CLINICAL CONDITION.   Kaylinn Dedic M.D on 03/20/2015 at 1:19 PM  Between 7am to 6pm - Pager - [phone]  After 6pm go to [email or web address] - password [email or web address] [facility]  [place] [facility]  Office  [phone]  CC: Primary care physician; Sherlene Shams, MD

## 2015-03-20 NOTE — Progress Notes (Signed)
  Subjective: 2 Days Post-Op Procedure(s) (LRB): CANNULATED HIP PINNING (Right) Patient reports pain as mild.   Patient seen in rounds with Dr. Rosita KeaMenz. Patient is well, and has had no acute complaints or problems.  She is more alert today and feeling much better. No significant complaints. Plan is to go Skilled nursing facility after hospital stay. Negative for chest pain and shortness of breath Fever: no Gastrointestinal: Negative for nausea and vomiting  Objective: Vital signs in last 24 hours: Temp:  [98.1 F (36.7 C)-98.9 F (37.2 C)] 98.6 F (37 C) (12/20 0359) Pulse Rate:  [73-89] 89 (12/20 0359) Resp:  [17-19] 19 (12/20 0359) BP: (133-159)/(63-84) 139/84 mmHg (12/20 0359) SpO2:  [89 %-97 %] 95 % (12/20 0359)  Intake/Output from previous day:  Intake/Output Summary (Last 24 hours) at 03/20/15 0720 Last data filed at 03/19/15 1900  Gross per 24 hour  Intake 1457.5 ml  Output      0 ml  Net 1457.5 ml    Intake/Output this shift:    Labs:  Recent Labs  03/17/15 1752 03/18/15 0308 03/19/15 0348 03/20/15 0459  HGB 14.1 13.2 13.1 13.2    Recent Labs  03/19/15 0348 03/20/15 0459  WBC 8.2 8.7  RBC 4.24 4.23  HCT 39.9 39.5  PLT 213 184    Recent Labs  03/19/15 0348 03/20/15 0459  NA 137 138  K 4.0 4.0  CL 106 107  CO2 25 27  BUN 14 13  CREATININE 0.94 0.97  GLUCOSE 144* 144*  CALCIUM 8.4* 8.3*   No results for input(s): LABPT, INR in the last 72 hours.   EXAM General - Patient is alert and oriented. Answering questions appropriately. Extremity - Sensation intact distally Dorsiflexion/Plantar flexion intact Compartment soft Dressing/Incision - clean, dry, very minimal scant bloody drainage Motor Function - intact, moving foot and toes well on exam. The patient ambulated 4 feet with physical therapy.  Past Medical History  Diagnosis Date  . Migraine   . Hepatitis   . Hepatitis, water-borne 1965  . Arthritis     left knee  . Dementia   .  Vitamin D deficiency disease   . Osteopenia   . Dementia     Assessment/Plan: 2 Days Post-Op Procedure(s) (LRB): CANNULATED HIP PINNING (Right) Principal Problem:   Femoral neck fracture, right, closed, initial encounter  Estimated body mass index is 30.65 kg/(m^2) as calculated from the following:   Height as of this encounter: 5\' 2"  (1.575 m).   Weight as of this encounter: 76.023 kg (167 lb 9.6 oz). Advance diet Up with therapy  Plan for possible discharge to skilled nursing tomorrow.  DVT Prophylaxis - Lovenox, Foot Pumps and TED hose Weight-Bearing as tolerated to right leg  Dedra Skeensodd Zymier Rodgers, PA-C Orthopaedic Surgery 03/20/2015, 7:20 AM

## 2015-03-20 NOTE — Progress Notes (Signed)
Physical Therapy Treatment Patient Details Name: Catherine Meyers MRN: [id number] DOB: 29-Nov-1927 Today's Date: 03/20/2015    History of Present Illness Pt is an 81 y.o. female with PMH of dementia, hepatitis, and osteoporosis presenting to hospital s/p mechanical fall and urinary frequency.  Pt found to have nondisplaced R femoral neck fx and s/p R cannulated hip pinning 03/18/15.    PT Comments    Pt making slow progress with ambulation distance with RW.  Pt still requiring 2 assist with functional mobility.  Progress limited d/t c/o significant R hip pain with any movement or activity.  Will continue to progress pt per pt tolerance.   Follow Up Recommendations  SNF     Equipment Recommendations       Recommendations for Other Services       Precautions / Restrictions Precautions Precautions: Fall Restrictions Weight Bearing Restrictions: Yes RLE Weight Bearing: Weight bearing as tolerated    Mobility  Bed Mobility Overal bed mobility: Needs Assistance Bed Mobility: Supine to Sit;Sit to Supine;Rolling Rolling:  (mod assist to R; max assist to L) (to clean pt up d/t incontinence)   Supine to sit: Mod assist;+2 for physical assistance (x2 trials) Sit to supine: Mod assist;Max assist;+2 for physical assistance   General bed mobility comments: assist for trunk and B LE's d/t R hip pain  Transfers Overall transfer level: Needs assistance Equipment used: Rolling walker (2 wheeled) Transfers: Sit to/from Stand Sit to Stand: Mod assist;+2 physical assistance         General transfer comment: vc's required for hand and feet placement/technique  Ambulation/Gait Ambulation/Gait assistance: Mod assist;+2 physical assistance Ambulation Distance (Feet): 6 Feet Assistive device: Rolling walker (2 wheeled)   Gait velocity: decreased   General Gait Details: antalgic; decreased stance time R LE; vc's for stepping technique and use of RW required   Stairs             Wheelchair Mobility    Modified Rankin (Stroke Patients Only)       Balance Overall balance assessment: Needs assistance Sitting-balance support: Bilateral upper extremity supported;Feet supported Sitting balance-Leahy Scale: Fair Sitting balance - Comments: Fair balance once set up in sitting (initial posterior lean)   Standing balance support: Bilateral upper extremity supported (on RW) Standing balance-Leahy Scale: Fair                      Cognition Arousal/Alertness: Awake/alert Behavior During Therapy: WFL for tasks assessed/performed Overall Cognitive Status: History of cognitive impairments - at baseline       Memory: Decreased recall of precautions              Exercises   Performed semi-supine B LE therapeutic exercise x 10 reps:  Ankle pumps (AAROM B LE's); SAQ's (AAROM R; AAROM L); heelslides (AAROM R; AAROM L), hip abd/adduction (AAROM R; AAROM L).  Pt required vc's and tactile cues for correct technique with exercises.  Decreased R LE ROM with ex's d/t c/o pain.     General Comments   Nursing cleared pt for participation in physical therapy.  Pt agreeable to PT session. Upon sitting pt up in bed, pt noted to be incontinent of urine and NA came to assist with clean up.      Pertinent Vitals/Pain Pain Assessment: Faces Faces Pain Scale: Hurts whole lot Pain Location: R hip Pain Descriptors / Indicators: Sore;Tender;Operative site guarding Pain Intervention(s): Limited activity within patient's tolerance;Monitored during session;Repositioned;RN gave pain meds during session  Vitals  stable and WFL throughout treatment session.    Home Living                      Prior Function            PT Goals (current goals can now be found in the care plan section) Acute Rehab PT Goals Patient Stated Goal: to have less pain PT Goal Formulation: With patient Time For Goal Achievement: 04/02/15 Potential to Achieve Goals: Fair Progress  towards PT goals: Progressing toward goals    Frequency  BID    PT Plan Current plan remains appropriate    Co-evaluation             End of Session Equipment Utilized During Treatment: Gait belt Activity Tolerance: Patient limited by pain Patient left: in chair;with call bell/phone within reach;with chair alarm set;with SCD's reapplied (B heels elevated via pillow)     Time: 1010-1048 PT Time Calculation (min) (ACUTE ONLY): 38 min  Charges:  $Gait Training: 8-22 mins $Therapeutic Exercise: 8-22 mins $Therapeutic Activity: 8-22 mins                    G CodesHendricks Limes:      Ranjit Ashurst 03/20/2015, 10:59 AM Hendricks LimesEmily Jyssica Rief, PT [phone]

## 2015-03-20 NOTE — Discharge Instructions (Signed)
INSTRUCTIONS AFTER Surgery  o Remove items at home which could result in a fall. This includes throw rugs or furniture in walking pathways o ICE to the affected joint every three hours while awake for 30 minutes at a time, for at least the first 3-5 days, and then as needed for pain and swelling.  Continue to use ice for pain and swelling. You may notice swelling that will progress down to the foot and ankle.  This is normal after surgery.  Elevate your leg when you are not up walking on it.   o Continue to use the breathing machine you got in the hospital (incentive spirometer) which will help keep your temperature down.  It is common for your temperature to cycle up and down following surgery, especially at night when you are not up moving around and exerting yourself.  The breathing machine keeps your lungs expanded and your temperature down.   DIET:  As you were doing prior to hospitalization, we recommend a well-balanced diet.  DRESSING / WOUND CARE / SHOWERING  Dressing consisting intact and she is able to shower with the honeycomb dressing in place. Watch for signs of infection and drainage. Staples will be removed at the orthopedic department in 2 weeks.  ACTIVITY  o Increase activity slowly as tolerated, but follow the weight bearing instructions below.   o No driving for 6 weeks or until further direction given by your physician.  You cannot drive while taking narcotics.  o No lifting or carrying greater than 10 lbs. until further directed by your surgeon. o Avoid periods of inactivity such as sitting longer than an hour when not asleep. This helps prevent blood clots.  o You may return to work once you are authorized by your doctor.     WEIGHT BEARING  Weight-bearing as tolerated   EXERCISES The patient can ambulate with a walker and do strengthening as well as range of motion exercises. They need to do gait training.  CONSTIPATION  Constipation is defined medically as  fewer than three stools per week and severe constipation as less than one stool per week.  Even if you have a regular bowel pattern at home, your normal regimen is likely to be disrupted due to multiple reasons following surgery.  Combination of anesthesia, postoperative narcotics, change in appetite and fluid intake all can affect your bowels.   YOU MUST use at least one of the following options; they are listed in order of increasing strength to get the job done.  They are all available over the counter, and you may need to use some, POSSIBLY even all of these options:    Drink plenty of fluids (prune juice may be helpful) and high fiber foods Colace 100 mg by mouth twice a day  Senokot for constipation as directed and as needed Dulcolax (bisacodyl), take with full glass of water  Miralax (polyethylene glycol) once or twice a day as needed.  If you have tried all these things and are unable to have a bowel movement in the first 3-4 days after surgery call either your surgeon or your primary doctor.    If you experience loose stools or diarrhea, hold the medications until you stool forms back up.  If your symptoms do not get better within 1 week or if they get worse, check with your doctor.  If you experience "the worst abdominal pain ever" or develop nausea or vomiting, please contact the office immediately for further recommendations for treatment.  ITCHING:  If you experience itching with your medications, try taking only a single pain pill, or even half a pain pill at a time.  You can also use Benadryl over the counter for itching or also to help with sleep.   TED HOSE STOCKINGS:  Use stockings on both legs until for at least 2 weeks or as directed by physician office. They may be removed at night for sleeping.  MEDICATIONS:  See your medication summary on the After Visit Summary that nursing will review with you.  You may have some home medications which will be placed on hold until you  complete the course of blood thinner medication.  It is important for you to complete the blood thinner medication as prescribed.  PRECAUTIONS:  If you experience chest pain or shortness of breath - call 911 immediately for transfer to the hospital emergency department.   If you develop a fever greater that 101 F, purulent drainage from wound, increased redness or drainage from wound, foul odor from the wound/dressing, or calf pain - CONTACT YOUR SURGEON.                                                   FOLLOW-UP APPOINTMENTS:  If you do not already have a post-op appointment, please call the office for an appointment to be seen by your surgeon.  Guidelines for how soon to be seen are listed in your After Visit Summary, but are typically between 1-4 weeks after surgery.  OTHER INSTRUCTIONS:     MAKE SURE YOU:   Understand these instructions.   Get help right away if you are not doing well or get worse.    Thank you for letting us be a part of your medical care team.  It is a privilege we respect greatly.  We hope these instructions will help you stay on track for a fast and full recovery!

## 2015-03-20 NOTE — Progress Notes (Signed)
Physical Therapy Treatment Patient Details Name: Catherine Meyers MRN: [id number] DOB: [birth year] Today's Date: 03/20/2015    History of Present Illness Pt is an 81 y.o. female with PMH of dementia, hepatitis, and osteoporosis presenting to hospital s/p mechanical fall and urinary frequency.  Pt found to have nondisplaced R femoral neck fx and s/p R cannulated hip pinning 03/18/15.    PT Comments    Limited ambulation distance this afternoon d/t pt c/o R hip pain and fatigue (pt was pre-medicated prior to PT session with pain meds).  Pt noted to be incontinent of urine requiring clean-up after getting pt back to bed limiting PT session (2 NA's present to assist pt with clean-up and changing sheets).  Will continue to progress pt with LE strengthening and increasing ambulation distance per pt tolerance.   Follow Up Recommendations  SNF     Equipment Recommendations       Recommendations for Other Services       Precautions / Restrictions Precautions Precautions: Fall Restrictions Weight Bearing Restrictions: Yes RLE Weight Bearing: Weight bearing as tolerated    Mobility  Bed Mobility Overal bed mobility: Needs Assistance Bed Mobility: Sit to Supine       Sit to supine: Mod assist;Max assist;+2 for physical assistance   General bed mobility comments: assist for trunk and B LE's d/t R hip pain  Transfers Overall transfer level: Needs assistance Equipment used: Rolling walker (2 wheeled) Transfers: Sit to/from Stand Sit to Stand: Mod assist;+2 physical assistance         General transfer comment: vc's required for hand and feet placement/technique  Ambulation/Gait Ambulation/Gait assistance: Mod assist;+2 physical assistance Ambulation Distance (Feet): 4 Feet Assistive device: Rolling walker (2 wheeled)   Gait velocity: decreased   General Gait Details: antalgic; decreased stance time R LE; vc's for stepping technique and use of RW required; limited distance d/t  c/o pain and fatigue   Stairs            Wheelchair Mobility    Modified Rankin (Stroke Patients Only)       Balance Overall balance assessment: Needs assistance Sitting-balance support: Bilateral upper extremity supported;Feet supported Sitting balance-Leahy Scale: Fair     Standing balance support: Bilateral upper extremity supported (on RW) Standing balance-Leahy Scale: Fair                      Cognition Arousal/Alertness: Awake/alert Behavior During Therapy: WFL for tasks assessed/performed Overall Cognitive Status: History of cognitive impairments - at baseline       Memory: Decreased recall of precautions              Exercises      General Comments   Nursing cleared pt for participation in physical therapy.  Pt agreeable to PT session.     Pertinent Vitals/Pain Pain Assessment: Faces Faces Pain Scale: Hurts whole lot Pain Location: R hip Pain Descriptors / Indicators: Sore;Tender;Operative site guarding Pain Intervention(s): Limited activity within patient's tolerance;Monitored during session;Repositioned;Premedicated before session    Home Living                      Prior Function            PT Goals (current goals can now be found in the care plan section) Acute Rehab PT Goals Patient Stated Goal: to have less pain PT Goal Formulation: With patient Time For Goal Achievement: 04/02/15 Potential to Achieve Goals: Fair Progress towards PT goals:  Progressing toward goals    Frequency  BID    PT Plan Current plan remains appropriate    Co-evaluation             End of Session Equipment Utilized During Treatment: Gait belt Activity Tolerance: Patient limited by pain;Patient limited by fatigue Patient left: in bed;with nursing/sitter in room (2 NA's present assisting pt with clean-up d/t urinary incontinence)     Time: [id number] PT Time Calculation (min) (ACUTE ONLY): 15 min  Charges:  $Therapeutic  Activity: 8-22 mins                    G CodesHendricks Limes:      Maria Coin 03/20/2015, 3:56 PM Hendricks LimesEmily Shamaine Mulkern, PT [phone]

## 2015-03-21 ENCOUNTER — Telehealth: Payer: Self-pay | Admitting: Internal Medicine

## 2015-03-21 DIAGNOSIS — R262 Difficulty in walking, not elsewhere classified: Secondary | ICD-10-CM | POA: Diagnosis not present

## 2015-03-21 DIAGNOSIS — K219 Gastro-esophageal reflux disease without esophagitis: Secondary | ICD-10-CM | POA: Diagnosis not present

## 2015-03-21 DIAGNOSIS — S72001D Fracture of unspecified part of neck of right femur, subsequent encounter for closed fracture with routine healing: Secondary | ICD-10-CM | POA: Diagnosis not present

## 2015-03-21 DIAGNOSIS — E559 Vitamin D deficiency, unspecified: Secondary | ICD-10-CM | POA: Diagnosis not present

## 2015-03-21 DIAGNOSIS — K5900 Constipation, unspecified: Secondary | ICD-10-CM | POA: Diagnosis not present

## 2015-03-21 DIAGNOSIS — F0281 Dementia in other diseases classified elsewhere with behavioral disturbance: Secondary | ICD-10-CM | POA: Diagnosis not present

## 2015-03-21 DIAGNOSIS — R4182 Altered mental status, unspecified: Secondary | ICD-10-CM | POA: Diagnosis not present

## 2015-03-21 DIAGNOSIS — Z741 Need for assistance with personal care: Secondary | ICD-10-CM | POA: Diagnosis not present

## 2015-03-21 DIAGNOSIS — F419 Anxiety disorder, unspecified: Secondary | ICD-10-CM | POA: Diagnosis not present

## 2015-03-21 DIAGNOSIS — M6281 Muscle weakness (generalized): Secondary | ICD-10-CM | POA: Diagnosis not present

## 2015-03-21 DIAGNOSIS — R488 Other symbolic dysfunctions: Secondary | ICD-10-CM | POA: Diagnosis not present

## 2015-03-21 DIAGNOSIS — R001 Bradycardia, unspecified: Secondary | ICD-10-CM | POA: Diagnosis not present

## 2015-03-21 DIAGNOSIS — M25551 Pain in right hip: Secondary | ICD-10-CM | POA: Diagnosis not present

## 2015-03-21 DIAGNOSIS — Z9181 History of falling: Secondary | ICD-10-CM | POA: Diagnosis not present

## 2015-03-21 DIAGNOSIS — G301 Alzheimer's disease with late onset: Secondary | ICD-10-CM | POA: Diagnosis not present

## 2015-03-21 DIAGNOSIS — Z419 Encounter for procedure for purposes other than remedying health state, unspecified: Secondary | ICD-10-CM | POA: Diagnosis not present

## 2015-03-21 DIAGNOSIS — S72001A Fracture of unspecified part of neck of right femur, initial encounter for closed fracture: Secondary | ICD-10-CM | POA: Diagnosis not present

## 2015-03-21 MED ORDER — LORAZEPAM 0.5 MG PO TABS: 0.5000 mg | ORAL_TABLET | Freq: Four times a day (QID) | ORAL | Status: DC | PRN

## 2015-03-21 MED ORDER — DOCUSATE SODIUM 100 MG PO CAPS
100.0000 mg | ORAL_CAPSULE | Freq: Two times a day (BID) | ORAL | Status: DC
Start: 2015-03-21 — End: 2018-09-14

## 2015-03-21 NOTE — Telephone Encounter (Signed)
Thank you.  Will continue to follow after discharge from rehab.

## 2015-03-21 NOTE — Discharge Summary (Signed)
[facility] [place] at [facility]   PATIENT NAME: Catherine Meyers    MR#:  [id number]  DATE OF BIRTH:  21-Feb-1928  DATE OF ADMISSION:  03/17/2015 ADMITTING PHYSICIAN: Wyatt Hasteavid K Hower, MD  DATE OF DISCHARGE: 03/21/15  PRIMARY CARE PHYSICIAN: Sherlene ShamsULLO, TERESA L, MD    ADMISSION DIAGNOSIS:  Hip fracture, right, closed, initial encounter (HCC) [S72.001A]  DISCHARGE DIAGNOSIS:  Principal Problem:   Femoral neck fracture, right, closed, initial encounter   SECONDARY DIAGNOSIS:   Past Medical History  Diagnosis Date  . Migraine   . Hepatitis   . Hepatitis, water-borne 1965  . Arthritis     left knee  . Dementia   . Vitamin D deficiency disease   . Osteopenia   . Dementia     HOSPITAL COURSE:   81y/o female with known history of migraines, dementia, osteoporosis from Assisted living facility presented to the hospital after a fall and noted to have right femoral neck fracture.  #1 Right hip fracture-.from fall. POD# 3 -no complications noted. -appreciate consult by orthopedics. -pain control and physical therapy -discharged to rehabilitation today - on lovenox for DVT prophylaxis for 2 weeks  #2 dementia-pleasantly confused. Appears to be at baseline. -Continue her Seroquel - on ativan prn as well  #3 GERD-continue PPI  Discharge to rehab today  DISCHARGE CONDITIONS:   Stable  CONSULTS OBTAINED:  Treatment Team:  Wyatt Hasteavid K Hower, MD Kennedy BuckerMichael Menz, MD  DRUG ALLERGIES:  No Known Allergies  DISCHARGE MEDICATIONS:   Current Discharge Medication List    START taking these medications   Details  docusate sodium (COLACE) 100 MG capsule Take 1 capsule (100 mg total) by mouth 2 (two) times daily. Qty: 10 capsule, Refills: 0    enoxaparin (LOVENOX) 40 MG/0.4ML injection Inject 0.4 mLs (40 mg total) into the skin daily. Qty: 14 Syringe, Refills: 0    oxyCODONE (OXY IR/ROXICODONE) 5 MG immediate release tablet Take 1 tablet (5 mg total) by  mouth every 4 (four) hours as needed for moderate pain. Qty: 30 tablet, Refills: 0      CONTINUE these medications which have CHANGED   Details  LORazepam (ATIVAN) 0.5 MG tablet Take 1 tablet (0.5 mg total) by mouth every 6 (six) hours as needed for anxiety. Qty: 20 tablet, Refills: 0      CONTINUE these medications which have NOT CHANGED   Details  alum & mag hydroxide-simeth (MAALOX/MYLANTA) 200-200-20 MG/5ML suspension Take 15 mLs by mouth every 6 (six) hours as needed for indigestion or heartburn.    bismuth subsalicylate (PEPTO BISMOL) 262 MG/15ML suspension Take 30 mLs by mouth every 6 (six) hours as needed.    Cranberry Fruit 405 MG CAPS TAKE 1 CAPSULE BY MOUTH TWICE DAILY FOR URINARY HEALTH Qty: 60 each, Refills: PRN    guaiFENesin (ROBITUSSIN) 100 MG/5ML liquid Take 200 mg by mouth 3 (three) times daily as needed for cough.    loperamide (IMODIUM) 2 MG capsule Take by mouth as needed for diarrhea or loose stools.    magnesium hydroxide (MILK OF MAGNESIA) 400 MG/5ML suspension Take 15 mLs by mouth daily as needed for mild constipation.    MAPAP 500 MG tablet TAKE 2 TABLETS (1000 MG) BY MOUTH TWICE DAILY AS NEEDED FOR DISCOMFORT OR FEVER Qty: 60 tablet, Refills: 11    omeprazole (PRILOSEC) 40 MG capsule TAKE 1 CAPSULE BY MOUTH ONCE DAILY Qty: 90 capsule, Refills: 1    QUEtiapine (SEROQUEL) 50 MG tablet TAKE 1 TABLET BY  MOUTH 1 HOUR BEFORE BEDTIME DAILY Qty: 30 tablet, Refills: 8    Vitamin D, Ergocalciferol, (DRISDOL) 50000 UNITS CAPS capsule Take 50,000 Units by mouth every 7 (seven) days.         DISCHARGE INSTRUCTIONS:   1. PCP f/u in 2 weeks 2. Orthopedics follow up in 2 weeks 3. Physical Therapy  If you experience worsening of your admission symptoms, develop shortness of breath, life threatening emergency, suicidal or homicidal thoughts you must seek medical attention immediately by calling 911 or calling your MD immediately  if symptoms less severe.  You  Must read complete instructions/literature along with all the possible adverse reactions/side effects for all the Medicines you take and that have been prescribed to you. Take any new Medicines after you have completely understood and accept all the possible adverse reactions/side effects.   Please note  You were cared for by a hospitalist during your hospital stay. If you have any questions about your discharge medications or the care you received while you were in the hospital after you are discharged, you can call the unit and asked to speak with the hospitalist on call if the hospitalist that took care of you is not available. Once you are discharged, your primary care physician will handle any further medical issues. Please note that NO REFILLS for any discharge medications will be authorized once you are discharged, as it is imperative that you return to your primary care physician (or establish a relationship with a primary care physician if you do not have one) for your aftercare needs so that they can reassess your need for medications and monitor your lab values.    Today   CHIEF COMPLAINT:   Chief Complaint  Patient presents with  . Fall  . Urinary Frequency    VITAL SIGNS:  Blood pressure 150/69, pulse 73, temperature 98.2 F (36.8 C), temperature source Oral, resp. rate 18, height  (1.575 m), weight 76.023 kg (167 lb 9.6 oz), SpO2 97 %.  I/O:   Intake/Output Summary (Last 24 hours) at 03/21/15 0842 Last data filed at 03/20/15 1727  Gross per 24 hour  Intake 2151.25 ml  Output      0 ml  Net 2151.25 ml    PHYSICAL EXAMINATION:   Physical Exam  GENERAL: 81 y.o.-year-old patient sitting in the chair with no acute distress.  EYES: Pupils equal, round, reactive to light and accommodation. No scleral icterus. Extraocular muscles intact.  HEENT: Head atraumatic, normocephalic. Oropharynx and nasopharynx clear.  NECK: Supple, no jugular venous distention. No  thyroid enlargement, no tenderness.  LUNGS: Normal breath sounds bilaterally, no wheezing, rales,rhonchi or crepitation. No use of accessory muscles of respiration. Decreased bibasilar breath sounds CARDIOVASCULAR: S1, S2 normal. No murmurs, rubs, or gallops.  ABDOMEN: Soft, nontender, nondistended. Bowel sounds present. No organomegaly or mass.  EXTREMITIES: No pedal edema, cyanosis, or clubbing. Right hip- laterally, dressing in place. NEUROLOGIC: Cranial nerves II through XII are intact. Muscle strength 5/5 in all extremities but limited in right leg due to pain. Sensation intact. Gait not checked.  PSYCHIATRIC: The patient is alert and oriented to self.  SKIN: No obvious rash, lesion, or ulcer.   DATA REVIEW:   CBC  Recent Labs Lab 03/20/15 0459  WBC 8.7  HGB 13.2  HCT 39.5  PLT 184    Chemistries   Recent Labs Lab 03/19/15 0348 03/20/15 0459  NA 137 138  K 4.0 4.0  CL 106 107  CO2 25 27  GLUCOSE 144* 144*  BUN 14 13  CREATININE 0.94 0.97  CALCIUM 8.4* 8.3*  MG 2.2  --     Cardiac Enzymes No results for input(s): TROPONINI in the last 168 hours.  Microbiology Results  Results for orders placed or performed during the hospital encounter of 03/17/15  MRSA PCR Screening     Status: None   Collection Time: 03/18/15  1:40 AM  Result Value Ref Range Status   MRSA by PCR NEGATIVE NEGATIVE Final    Comment:        The GeneXpert MRSA Assay (FDA approved for NASAL specimens only), is one component of a comprehensive MRSA colonization surveillance program. It is not intended to diagnose MRSA infection nor to guide or monitor treatment for MRSA infections.     RADIOLOGY:  No results found.  EKG:   Orders placed or performed during the hospital encounter of 03/17/15  . EKG 12-Lead  . EKG 12-Lead      Management plans discussed with the patient, family and they are in agreement.  CODE STATUS:     Code Status Orders        Start     Ordered    03/18/15 1319  Do not attempt resuscitation (DNR)   Continuous    Question Answer Comment  In the event of cardiac or respiratory ARREST Do not call a "code blue"   In the event of cardiac or respiratory ARREST Do not perform Intubation, CPR, defibrillation or ACLS   In the event of cardiac or respiratory ARREST Use medication by any route, position, wound care, and other measures to relive pain and suffering. May use oxygen, suction and manual treatment of airway obstruction as needed for comfort.      03/18/15 1318    Advance Directive Documentation        Most Recent Value   Type of Advance Directive  Healthcare Power of Attorney, Living will   Pre-existing out of facility DNR order (yellow form or pink MOST form)     "MOST" Form in Place?        TOTAL TIME TAKING CARE OF THIS PATIENT: 38 minutes.    Enid Baas M.D on 03/21/2015 at 8:42 AM  Between 7am to 6pm - Pager - [phone]  After 6pm go to [email or web address] - password [email or web address] [facility]  [facility] [place] [facility]  Office  [phone]  CC: Primary care physician; Sherlene Shams, MD

## 2015-03-21 NOTE — Progress Notes (Signed)
Physical Therapy Treatment Patient Details Name: Catherine Meyers MRN: [id number] DOB: 10/27/1927 Today's Date: 03/21/2015    History of Present Illness Pt is an 81 y.o. female with PMH of dementia, hepatitis, and osteoporosis presenting to hospital s/p mechanical fall and urinary frequency.  Pt found to have nondisplaced R femoral neck fx and s/p R cannulated hip pinning 03/18/15.    PT Comments    Pt demonstrating improved tolerance to LE therapeutic exercises in bed (pt still grimacing in pain with R LE movement but less than previous days and improved ROM noted).  Plan to discharge to STR today.  Follow Up Recommendations  SNF     Equipment Recommendations       Recommendations for Other Services       Precautions / Restrictions Precautions Precautions: Fall Restrictions Weight Bearing Restrictions: Yes RLE Weight Bearing: Weight bearing as tolerated    Mobility  Bed Mobility                  Transfers                    Ambulation/Gait                 Stairs            Wheelchair Mobility    Modified Rankin (Stroke Patients Only)       Balance                                    Cognition Arousal/Alertness: Awake/alert Behavior During Therapy: WFL for tasks assessed/performed Overall Cognitive Status: History of cognitive impairments - at baseline       Memory: Decreased recall of precautions              Exercises   Performed semi-supine B LE therapeutic exercise 2x15 reps:  Ankle pumps (AAROM B LE's); quad sets x3 second holds (AROM B LE's); glute squeezes x3 second holds (AROM B); SAQ's (AAROM R; AAROM L); heelslides (AAROM R; AAROM L), hip abd/adduction (AAROM R; AAROM L).  Pt required vc's and tactile cues for correct technique with exercises.     General Comments   Nursing cleared pt for participation in physical therapy.  Pt agreeable to PT session.      Pertinent Vitals/Pain Pain Assessment:  Faces Faces Pain Scale: Hurts even more Pain Location: R hip Pain Descriptors / Indicators: Sore;Tender;Operative site guarding Pain Intervention(s): Limited activity within patient's tolerance;Monitored during session;Premedicated before session;Repositioned  Vitals stable and WFL throughout treatment session.    Home Living                      Prior Function            PT Goals (current goals can now be found in the care plan section) Acute Rehab PT Goals Patient Stated Goal: to have less pain PT Goal Formulation: With patient Time For Goal Achievement: 04/02/15 Potential to Achieve Goals: Fair Progress towards PT goals: Progressing toward goals (with LE strengthening)    Frequency  BID    PT Plan Current plan remains appropriate    Co-evaluation             End of Session   Activity Tolerance: Patient limited by pain Patient left: in bed;with call bell/phone within reach;with bed alarm set;with SCD's reapplied (B heels elevated via pillows)  Time: [id number] PT Time Calculation (min) (ACUTE ONLY): 24 min  Charges:  $Therapeutic Exercise: 23-37 mins                    G CodesHendricks Limes:      Leray Garverick 03/21/2015, 10:15 AM Hendricks LimesEmily Tamerra Merkley, PT [phone]

## 2015-03-21 NOTE — Telephone Encounter (Signed)
HFU/ FYI, Pt is being discharged today from hospital. Diagnosis is Right hip fracture. Pt is scheduled for 04/06/2015. Thank you!

## 2015-03-21 NOTE — Progress Notes (Signed)
DISCHARGE NOTE:  EMS here to  transport pt to [facility].

## 2015-03-21 NOTE — Progress Notes (Signed)
  [facility]

## 2015-03-21 NOTE — Care Management Important Message (Signed)
Important Message  Patient Details  Name: Catherine Meyers MRN: [id number] Date of Birth: [birth year]   Medicare Important Message Given:  Yes    Olegario MessierKathy A Finnlee Guarnieri 03/21/2015, 10:00 AM

## 2015-03-21 NOTE — Progress Notes (Signed)
  Subjective: 3 Days Post-Op Procedure(s) (LRB): CANNULATED HIP PINNING (Right) Patient reports pain as mild.   Patient seen in rounds with Dr. Rosita KeaMenz. Patient is well, and has had no acute complaints or problems.  She is more alert today and feeling much better. No significant complaints. Plan is to go Skilled nursing facility after hospital stay. Possible today. Negative for chest pain and shortness of breath Fever: no Gastrointestinal: Negative for nausea and vomiting  Objective: Vital signs in last 24 hours: Temp:  [98 F (36.7 C)-98.7 F (37.1 C)] 98.6 F (37 C) (12/21 0600) Pulse Rate:  [69-85] 73 (12/21 0600) Resp:  [18-20] 19 (12/21 0600) BP: (133-149)/(60-64) 133/64 mmHg (12/21 0600) SpO2:  [92 %-96 %] 95 % (12/21 0600)  Intake/Output from previous day:  Intake/Output Summary (Last 24 hours) at 03/21/15 0719 Last data filed at 03/20/15 1727  Gross per 24 hour  Intake 2151.25 ml  Output      0 ml  Net 2151.25 ml    Intake/Output this shift:    Labs:  Recent Labs  03/19/15 0348 03/20/15 0459  HGB 13.1 13.2    Recent Labs  03/19/15 0348 03/20/15 0459  WBC 8.2 8.7  RBC 4.24 4.23  HCT 39.9 39.5  PLT 213 184    Recent Labs  03/19/15 0348 03/20/15 0459  NA 137 138  K 4.0 4.0  CL 106 107  CO2 25 27  BUN 14 13  CREATININE 0.94 0.97  GLUCOSE 144* 144*  CALCIUM 8.4* 8.3*   No results for input(s): LABPT, INR in the last 72 hours.   EXAM General - Patient is alert and oriented. Answering questions appropriately. Extremity - Sensation intact distally Dorsiflexion/Plantar flexion intact Compartment soft Dressing/Incision - clean, dry, very minimal scant bloody drainage Motor Function - intact, moving foot and toes well on exam. The patient ambulated 4 feet with physical therapy.  Past Medical History  Diagnosis Date  . Migraine   . Hepatitis   . Hepatitis, water-borne 1965  . Arthritis     left knee  . Dementia   . Vitamin D deficiency  disease   . Osteopenia   . Dementia     Assessment/Plan: 3 Days Post-Op Procedure(s) (LRB): CANNULATED HIP PINNING (Right) Principal Problem:   Femoral neck fracture, right, closed, initial encounter  Estimated body mass index is 30.65 kg/(m^2) as calculated from the following:   Height as of this encounter: 5\' 2"  (1.575 m).   Weight as of this encounter: 76.023 kg (167 lb 9.6 oz). Advance diet Up with therapy  Plan for possible discharge to skilled nursing today. The patient will follow up in the orthopedic department in 2 weeks for staple removal. They will do dressing changes on an as-needed basis.  DVT Prophylaxis - Lovenox, Foot Pumps and TED hose Weight-Bearing as tolerated to right leg  Dedra Skeensodd Osama Coleson, PA-C Orthopaedic Surgery 03/21/2015, 7:19 AM

## 2015-03-21 NOTE — Telephone Encounter (Signed)
Ok. Your welcome and Thank you!

## 2015-03-23 ENCOUNTER — Encounter: Payer: Self-pay | Admitting: Internal Medicine

## 2015-03-27 DIAGNOSIS — E559 Vitamin D deficiency, unspecified: Secondary | ICD-10-CM | POA: Diagnosis not present

## 2015-03-27 DIAGNOSIS — F419 Anxiety disorder, unspecified: Secondary | ICD-10-CM | POA: Diagnosis not present

## 2015-03-27 DIAGNOSIS — K219 Gastro-esophageal reflux disease without esophagitis: Secondary | ICD-10-CM | POA: Diagnosis not present

## 2015-03-27 DIAGNOSIS — K5900 Constipation, unspecified: Secondary | ICD-10-CM | POA: Diagnosis not present

## 2015-03-27 DIAGNOSIS — F0281 Dementia in other diseases classified elsewhere with behavioral disturbance: Secondary | ICD-10-CM | POA: Diagnosis not present

## 2015-03-27 DIAGNOSIS — Z9181 History of falling: Secondary | ICD-10-CM | POA: Diagnosis not present

## 2015-03-27 DIAGNOSIS — S72001D Fracture of unspecified part of neck of right femur, subsequent encounter for closed fracture with routine healing: Secondary | ICD-10-CM | POA: Diagnosis not present

## 2015-04-04 DIAGNOSIS — M25551 Pain in right hip: Secondary | ICD-10-CM | POA: Diagnosis not present

## 2015-04-06 ENCOUNTER — Ambulatory Visit: Payer: Medicare Other | Admitting: Internal Medicine

## 2015-04-24 DIAGNOSIS — S72001D Fracture of unspecified part of neck of right femur, subsequent encounter for closed fracture with routine healing: Secondary | ICD-10-CM | POA: Diagnosis not present

## 2015-04-24 DIAGNOSIS — F0391 Unspecified dementia with behavioral disturbance: Secondary | ICD-10-CM | POA: Diagnosis not present

## 2015-04-24 DIAGNOSIS — F419 Anxiety disorder, unspecified: Secondary | ICD-10-CM | POA: Diagnosis not present

## 2015-04-24 DIAGNOSIS — Z9181 History of falling: Secondary | ICD-10-CM | POA: Diagnosis not present

## 2015-04-24 DIAGNOSIS — M6281 Muscle weakness (generalized): Secondary | ICD-10-CM | POA: Diagnosis not present

## 2015-04-26 DIAGNOSIS — F419 Anxiety disorder, unspecified: Secondary | ICD-10-CM | POA: Diagnosis not present

## 2015-04-26 DIAGNOSIS — S72001D Fracture of unspecified part of neck of right femur, subsequent encounter for closed fracture with routine healing: Secondary | ICD-10-CM | POA: Diagnosis not present

## 2015-04-26 DIAGNOSIS — Z9181 History of falling: Secondary | ICD-10-CM | POA: Diagnosis not present

## 2015-04-26 DIAGNOSIS — M6281 Muscle weakness (generalized): Secondary | ICD-10-CM | POA: Diagnosis not present

## 2015-04-26 DIAGNOSIS — F0391 Unspecified dementia with behavioral disturbance: Secondary | ICD-10-CM | POA: Diagnosis not present

## 2015-05-01 DIAGNOSIS — S72001D Fracture of unspecified part of neck of right femur, subsequent encounter for closed fracture with routine healing: Secondary | ICD-10-CM | POA: Diagnosis not present

## 2015-05-01 DIAGNOSIS — F0391 Unspecified dementia with behavioral disturbance: Secondary | ICD-10-CM | POA: Diagnosis not present

## 2015-05-01 DIAGNOSIS — F419 Anxiety disorder, unspecified: Secondary | ICD-10-CM | POA: Diagnosis not present

## 2015-05-01 DIAGNOSIS — M6281 Muscle weakness (generalized): Secondary | ICD-10-CM | POA: Diagnosis not present

## 2015-05-01 DIAGNOSIS — Z9181 History of falling: Secondary | ICD-10-CM | POA: Diagnosis not present

## 2015-05-02 DIAGNOSIS — Z9181 History of falling: Secondary | ICD-10-CM | POA: Diagnosis not present

## 2015-05-02 DIAGNOSIS — M6281 Muscle weakness (generalized): Secondary | ICD-10-CM | POA: Diagnosis not present

## 2015-05-02 DIAGNOSIS — F419 Anxiety disorder, unspecified: Secondary | ICD-10-CM | POA: Diagnosis not present

## 2015-05-02 DIAGNOSIS — S72001D Fracture of unspecified part of neck of right femur, subsequent encounter for closed fracture with routine healing: Secondary | ICD-10-CM | POA: Diagnosis not present

## 2015-05-02 DIAGNOSIS — F0391 Unspecified dementia with behavioral disturbance: Secondary | ICD-10-CM | POA: Diagnosis not present

## 2015-05-04 DIAGNOSIS — F0391 Unspecified dementia with behavioral disturbance: Secondary | ICD-10-CM | POA: Diagnosis not present

## 2015-05-04 DIAGNOSIS — Z9181 History of falling: Secondary | ICD-10-CM | POA: Diagnosis not present

## 2015-05-04 DIAGNOSIS — F419 Anxiety disorder, unspecified: Secondary | ICD-10-CM | POA: Diagnosis not present

## 2015-05-04 DIAGNOSIS — M6281 Muscle weakness (generalized): Secondary | ICD-10-CM | POA: Diagnosis not present

## 2015-05-04 DIAGNOSIS — S72001D Fracture of unspecified part of neck of right femur, subsequent encounter for closed fracture with routine healing: Secondary | ICD-10-CM | POA: Diagnosis not present

## 2015-05-07 DIAGNOSIS — F419 Anxiety disorder, unspecified: Secondary | ICD-10-CM | POA: Diagnosis not present

## 2015-05-07 DIAGNOSIS — S72001D Fracture of unspecified part of neck of right femur, subsequent encounter for closed fracture with routine healing: Secondary | ICD-10-CM | POA: Diagnosis not present

## 2015-05-07 DIAGNOSIS — Z9181 History of falling: Secondary | ICD-10-CM | POA: Diagnosis not present

## 2015-05-07 DIAGNOSIS — M6281 Muscle weakness (generalized): Secondary | ICD-10-CM | POA: Diagnosis not present

## 2015-05-07 DIAGNOSIS — F0391 Unspecified dementia with behavioral disturbance: Secondary | ICD-10-CM | POA: Diagnosis not present

## 2015-05-08 DIAGNOSIS — S72001D Fracture of unspecified part of neck of right femur, subsequent encounter for closed fracture with routine healing: Secondary | ICD-10-CM | POA: Diagnosis not present

## 2015-05-08 DIAGNOSIS — F0391 Unspecified dementia with behavioral disturbance: Secondary | ICD-10-CM | POA: Diagnosis not present

## 2015-05-08 DIAGNOSIS — Z9181 History of falling: Secondary | ICD-10-CM | POA: Diagnosis not present

## 2015-05-08 DIAGNOSIS — F419 Anxiety disorder, unspecified: Secondary | ICD-10-CM | POA: Diagnosis not present

## 2015-05-08 DIAGNOSIS — M6281 Muscle weakness (generalized): Secondary | ICD-10-CM | POA: Diagnosis not present

## 2015-05-09 ENCOUNTER — Emergency Department
Admission: EM | Admit: 2015-05-09 | Discharge: 2015-05-10 | Disposition: A | Discharge status: Home / Self Care | Payer: Medicare Other | Attending: Emergency Medicine | Admitting: Emergency Medicine

## 2015-05-09 ENCOUNTER — Emergency Department: Payer: Medicare Other

## 2015-05-09 DIAGNOSIS — F419 Anxiety disorder, unspecified: Secondary | ICD-10-CM | POA: Diagnosis not present

## 2015-05-09 DIAGNOSIS — S79911A Unspecified injury of right hip, initial encounter: Secondary | ICD-10-CM | POA: Insufficient documentation

## 2015-05-09 DIAGNOSIS — F0391 Unspecified dementia with behavioral disturbance: Secondary | ICD-10-CM | POA: Diagnosis not present

## 2015-05-09 DIAGNOSIS — W06XXXA Fall from bed, initial encounter: Secondary | ICD-10-CM | POA: Diagnosis not present

## 2015-05-09 DIAGNOSIS — Z9181 History of falling: Secondary | ICD-10-CM | POA: Diagnosis not present

## 2015-05-09 DIAGNOSIS — Y9389 Activity, other specified: Secondary | ICD-10-CM | POA: Diagnosis not present

## 2015-05-09 DIAGNOSIS — S72001D Fracture of unspecified part of neck of right femur, subsequent encounter for closed fracture with routine healing: Secondary | ICD-10-CM | POA: Diagnosis not present

## 2015-05-09 DIAGNOSIS — S50311A Abrasion of right elbow, initial encounter: Secondary | ICD-10-CM | POA: Insufficient documentation

## 2015-05-09 DIAGNOSIS — F0390 Unspecified dementia without behavioral disturbance: Secondary | ICD-10-CM | POA: Insufficient documentation

## 2015-05-09 DIAGNOSIS — M6281 Muscle weakness (generalized): Secondary | ICD-10-CM | POA: Diagnosis not present

## 2015-05-09 DIAGNOSIS — Z79899 Other long term (current) drug therapy: Secondary | ICD-10-CM | POA: Insufficient documentation

## 2015-05-09 DIAGNOSIS — Y998 Other external cause status: Secondary | ICD-10-CM | POA: Diagnosis not present

## 2015-05-09 DIAGNOSIS — Y92128 Other place in nursing home as the place of occurrence of the external cause: Secondary | ICD-10-CM | POA: Diagnosis not present

## 2015-05-09 DIAGNOSIS — M25551 Pain in right hip: Secondary | ICD-10-CM | POA: Diagnosis not present

## 2015-05-09 DIAGNOSIS — W19XXXA Unspecified fall, initial encounter: Secondary | ICD-10-CM | POA: Diagnosis not present

## 2015-05-09 DIAGNOSIS — M79601 Pain in right arm: Secondary | ICD-10-CM | POA: Diagnosis not present

## 2015-05-09 NOTE — ED Notes (Signed)
Patient resides at assisted living home. Staff states they heard a "thump" and found sitting on floor next to bed.

## 2015-05-09 NOTE — ED Provider Notes (Signed)
[facility] Emergency Department Provider Note  ____________________________________________  Time seen: Approximately 10:59 PM  I have reviewed the triage vital signs and the nursing notes.   HISTORY  Chief Complaint Fall  History is limited by the patient's chronic dementia  HPI Catherine Meyers is a 82 y.o. female with a history of dementia and a fall just under 2 months ago that resulted in a right hip fracture status post ORIF.  She presents tonight by EMS from her nursing facility after she fell out of bed.  Paramedics reported, and the ED nurse verified by phone with the nursing facility, that the patient has been doing physical therapy and is able to ambulate with a walker, but she typically does not get out of bed by herself.  Tonight she attempted to do so and staff heard a loud "thump".  The immediately went to check on her and found her seated on the floor next to the bed.  She did not appear to strike her head or lose consciousness.  She was not in any distress.  Paramedics arrived to evaluate her and she was able to ambulate with a walker for them, but she reported some pain and given the recent operation it was decided that she should come to the emergency department for evaluation.  At this time the patient is not in any distress and denies any pain while lying in bed.  She has no recollection of the accident but this is baseline for her given her chronic dementia.  Due to her dementia she is unable to quantify the pain she was feeling previously.  The onset of the event was acute.  She has no associated symptoms at this time.  Past Medical History  Diagnosis Date  . Migraine   . Hepatitis   . Hepatitis, water-borne 1965  . Arthritis     left knee  . Dementia   . Vitamin D deficiency disease   . Osteopenia   . Dementia     Patient Active Problem List   Diagnosis Date Noted  . Femoral neck fracture, right, closed, initial encounter 03/17/2015   . History of recent fall 11/05/2014  . Do not resuscitate status 11/05/2014  . Dementia with behavioral disturbance   . Vitamin D deficiency disease   . Weight gain 09/02/2014  . Sprain of ankle 11/04/2013  . Fall at home 11/04/2013  . Acute renal failure (HCC) 11/04/2013  . Osteopenia of the elderly 11/04/2013  . Encounter for preventive health examination 06/02/2013  . Vascular dementia with depressed mood 05/30/2012  . Vitamin D deficiency 05/05/2012  . Depression 05/04/2012    Past Surgical History  Procedure Laterality Date  . Abdominal hysterectomy  1986  . Knee arthroscopy  2003  . Hip pinning,cannulated Right 03/18/2015    Procedure: CANNULATED HIP PINNING;  Surgeon: Kennedy Bucker, MD;  Location: [facility] ORS;  Service: Orthopedics;  Laterality: Right;    Current Outpatient Rx  Name  Route  Sig  Dispense  Refill  . alum & mag hydroxide-simeth (MAALOX/MYLANTA) 200-200-20 MG/5ML suspension   Oral   Take 15 mLs by mouth every 6 (six) hours as needed for indigestion or heartburn.         . bismuth subsalicylate (PEPTO BISMOL) 262 MG/15ML suspension   Oral   Take 30 mLs by mouth every 6 (six) hours as needed.         . Cranberry Fruit 405 MG CAPS      TAKE 1  CAPSULE BY MOUTH TWICE DAILY FOR URINARY HEALTH   60 each   PRN     TO CONTINUE THIS MEDICINE WE WILL NEED REFILLS AUT ...   . docusate sodium (COLACE) 100 MG capsule   Oral   Take 1 capsule (100 mg total) by mouth 2 (two) times daily.   10 capsule   0   . enoxaparin (LOVENOX) 40 MG/0.4ML injection   Subcutaneous   Inject 0.4 mLs (40 mg total) into the skin daily.   14 Syringe   0   . guaiFENesin (ROBITUSSIN) 100 MG/5ML liquid   Oral   Take 200 mg by mouth 3 (three) times daily as needed for cough.         . loperamide (IMODIUM) 2 MG capsule   Oral   Take by mouth as needed for diarrhea or loose stools.         Marland Kitchen LORazepam (ATIVAN) 0.5 MG tablet   Oral   Take 1 tablet (0.5 mg total) by mouth  every 6 (six) hours as needed for anxiety.   20 tablet   0   . magnesium hydroxide (MILK OF MAGNESIA) 400 MG/5ML suspension   Oral   Take 15 mLs by mouth daily as needed for mild constipation.         Marland Kitchen MAPAP 500 MG tablet      TAKE 2 TABLETS (1000 MG) BY MOUTH TWICE DAILY AS NEEDED FOR DISCOMFORT OR FEVER   60 tablet   11   . omeprazole (PRILOSEC) 40 MG capsule      TAKE 1 CAPSULE BY MOUTH ONCE DAILY   90 capsule   1   . oxyCODONE (OXY IR/ROXICODONE) 5 MG immediate release tablet   Oral   Take 1 tablet (5 mg total) by mouth every 4 (four) hours as needed for moderate pain.   30 tablet   0   . QUEtiapine (SEROQUEL) 50 MG tablet      TAKE 1 TABLET BY MOUTH 1 HOUR BEFORE BEDTIME DAILY   30 tablet   8   . Vitamin D, Ergocalciferol, (DRISDOL) 50000 UNITS CAPS capsule   Oral   Take 50,000 Units by mouth every 7 (seven) days.           Allergies Review of patient's allergies indicates no known allergies.  Family History  Problem Relation Age of Onset  . Cancer Sister     breast    Social History Social History  Substance Use Topics  . Smoking status: Never Smoker   . Smokeless tobacco: None  . Alcohol Use: No    Review of Systems  (LIMITED DUE TO CHRONIC DEMENTIA, BUT THIS ROS REFLECTS THE PATIENTS CURRENT SYMPTOMS (OR LACK THEREOF)  Constitutional: No fever/chills Eyes: No visual changes. ENT: No sore throat. Cardiovascular: Denies chest pain. Respiratory: Denies shortness of breath. Gastrointestinal: No abdominal pain.  No nausea, no vomiting.  No diarrhea.  No constipation. Genitourinary: Negative for dysuria. Musculoskeletal: Negative for back pain. No pain in extremities Skin: Negative for rash. Neurological: Negative for headaches, focal weakness or numbness.  10-point ROS otherwise negative.  ____________________________________________   PHYSICAL EXAM:  ED Triage Vitals  Enc Vitals Group     BP 05/09/15 2258 151/89 mmHg     Pulse  Rate 05/09/15 2258 73     Resp 05/09/15 2258 17     Temp 05/09/15 2258 97.4 F (36.3 C)     Temp Source 05/09/15 2258 Oral     SpO2 05/09/15  2258 96 %     Weight 05/09/15 2258 160 lb (72.576 kg)     Height 05/09/15 2258  (1.651 m)     Head Cir --      Peak Flow --      Pain Score 05/09/15 2301 0     Pain Loc --      Pain Edu? --      Excl. in GC? --    Constitutional: Alert and oriented to self.  No acute distress. Eyes: Conjunctivae are normal. PERRL. EOMI. Head: Atraumatic. Nose: No congestion/rhinnorhea. Mouth/Throat: Mucous membranes are moist.  Oropharynx non-erythematous. Neck: No stridor.  No cervical spine tenderness to palpation.  No pain or tenderness with full range of motion of head and neck. Cardiovascular: Normal rate, regular rhythm. Grossly normal heart sounds.  Good peripheral circulation. Respiratory: Normal respiratory effort.  No retractions. Lungs CTAB. Gastrointestinal: Soft and nontender. No distention. No abdominal bruits. No CVA tenderness. Musculoskeletal/Skin: No trauma appreciated except for as documented below under "Skin" exam on the right upper extremity.  I fully ranged the patient's upper extremities and lower extremities.  She had no tenderness on passive range of motion except for internal and external rotation and flexion and extension of the right hip, which caused her mild tenderness and is in the setting of ORIF less than 2 months ago. Neurologic:  Normal speech and language. No gross focal neurologic deficits are appreciated.  Skin:  Skin is warm, dry and intact except for a small subacute abrasion to the right elbow.  There is also some erythema most consistent with a contusion on around the right elbow which may be acute.  There is no evidence of cellulitis/infection. Psychiatric: Mood and affect are normal. Speech and behavior are normal.  ____________________________________________   LABS (all labs ordered are listed, but only abnormal  results are displayed)  Labs Reviewed - No data to display ____________________________________________  EKG  ED ECG REPORT I, Nixon Sparr, the attending physician, personally viewed and interpreted this ECG.   Date: 05/09/2015  EKG Time: 22:44  Rate: 78  Rhythm: normal sinus rhythm with first-degree AV block and occasional PAC  Axis: Normal  Intervals:Normal except for PR interval of 262 consistent with first-degree AV block  ST&T Change: Non-specific ST segment / T-wave changes, but no evidence of acute ischemia.  ____________________________________________  RADIOLOGY   Dg Hip Unilat With Pelvis 2-3 Views Right  05/09/2015  CLINICAL DATA:  Unwitnessed fall.  Pain. EXAM: DG HIP (WITH OR WITHOUT PELVIS) 2-3V RIGHT COMPARISON:  RIGHT hip films 03/17/2015. FINDINGS: There is no evidence of hip fracture or dislocation. There is no evidence of arthropathy or other focal bone abnormality. Three cannulated screws have been placed across a femoral neck fracture. IMPRESSION: No acute findings.  Satisfactory postoperative appearance. Electronically Signed   By: Elsie Stain M.D.   On: 05/09/2015 23:53    ____________________________________________   PROCEDURES  Procedure(s) performed: None  Critical Care performed: No ____________________________________________   INITIAL IMPRESSION / ASSESSMENT AND PLAN / ED COURSE  Pertinent labs & imaging results that were available during my care of the patient were reviewed by me and considered in my medical decision making (see chart for details).  Given the patient's recent injury to her right hip and internal fixation as well as the pain and tenderness as she is currently experiencing I will obtain plain films of the right hip.  However I doubt an acute injury given that she is able to bear  weight and ambulate with a walker as per her usual.  She has no evidence of head injury and no headache or C-spine tenderness to palpation or with  range of motion.  I do not believe there is any benefit to obtaining CT scan imaging at this time.  I will reassess after her plain films.  ----------------------------------------- 12:37 AM on 05/10/2015 -----------------------------------------  The patient's x-rays are essentially normal with no acute findings and satisfactory postop appearance.  She is able to ambulate with her walker which is her baseline.  We will discharge her back to her facility with no evidence of acute injury at this time.  ____________________________________________  FINAL CLINICAL IMPRESSION(S) / ED DIAGNOSES  Final diagnoses:  History of recent fall  Chronic dementia, without behavioral disturbance      NEW MEDICATIONS STARTED DURING THIS VISIT:  New Prescriptions   No medications on file     Loleta Rose, MD 05/10/15 0007

## 2015-05-09 NOTE — ED Notes (Signed)
Patient has severe dementia. Fell tonight in her room. Told EMS "she just wanted to come to the hospital to be checked out"

## 2015-05-10 ENCOUNTER — Encounter: Payer: Self-pay | Admitting: Internal Medicine

## 2015-05-10 ENCOUNTER — Ambulatory Visit (INDEPENDENT_AMBULATORY_CARE_PROVIDER_SITE_OTHER): Payer: Medicare Other | Admitting: Internal Medicine

## 2015-05-10 VITALS — BP 108/70 | HR 69 | Temp 97.4°F | Ht 62.0 in | Wt 167.0 lb

## 2015-05-10 DIAGNOSIS — R296 Repeated falls: Secondary | ICD-10-CM | POA: Diagnosis not present

## 2015-05-10 DIAGNOSIS — F0391 Unspecified dementia with behavioral disturbance: Secondary | ICD-10-CM | POA: Diagnosis not present

## 2015-05-10 DIAGNOSIS — R635 Abnormal weight gain: Secondary | ICD-10-CM

## 2015-05-10 DIAGNOSIS — Z9181 History of falling: Secondary | ICD-10-CM | POA: Diagnosis not present

## 2015-05-10 DIAGNOSIS — E559 Vitamin D deficiency, unspecified: Secondary | ICD-10-CM

## 2015-05-10 DIAGNOSIS — M6281 Muscle weakness (generalized): Secondary | ICD-10-CM | POA: Diagnosis not present

## 2015-05-10 DIAGNOSIS — F419 Anxiety disorder, unspecified: Secondary | ICD-10-CM | POA: Diagnosis not present

## 2015-05-10 DIAGNOSIS — S72001D Fracture of unspecified part of neck of right femur, subsequent encounter for closed fracture with routine healing: Secondary | ICD-10-CM | POA: Diagnosis not present

## 2015-05-10 MED ORDER — HYDROCODONE-ACETAMINOPHEN 5-325 MG PO TABS: 1.0000 | ORAL_TABLET | Freq: Every day | ORAL | Status: DC

## 2015-05-10 NOTE — Progress Notes (Signed)
Subjective:  Patient ID: Catherine Meyers, female    DOB: 1927/10/25  Age: 82 y.o. MRN: [id number]  CC: The primary encounter diagnosis was Weight gain. Diagnoses of Recurrent falls while walking and Vitamin D deficiency were also pertinent to this visit.  HPI Catherine Meyers presents for follow up on ER visit yesterday for fall which occurred at her residence facility.  She is accompanied by son and caregiver. The fall occurred when patient got up from a chair without assistance or walker.   She had a prior fall which resulted in right hip fracture in mid December.  occurred when she tripped  over a walker,   Found on CT, plain films were unremarkable. Surgery done by  Kennedy Bucker followed by one month or rehab at [facility] is providing PT currently at the facility . Marland Kitchen   Right hip x rays showed no new fractures and good alignment of prior surgical repair  Screws intact. .   Patient has advanced dementia and has no fear of falling.  She is noted  to sit down without checking to make sure she is lined up over the seat behind her. . She has no sense of safety   She has been receiving scheduled Percocet at night,  And 1000 mg acetaminophen daily in the morning   Outpatient Prescriptions Prior to Visit  Medication Sig Dispense Refill  . alum & mag hydroxide-simeth (MAALOX/MYLANTA) 200-200-20 MG/5ML suspension Take 15 mLs by mouth every 6 (six) hours as needed for indigestion or heartburn.    . bismuth subsalicylate (PEPTO BISMOL) 262 MG/15ML suspension Take 30 mLs by mouth every 6 (six) hours as needed.    . Cranberry Fruit 405 MG CAPS TAKE 1 CAPSULE BY MOUTH TWICE DAILY FOR URINARY HEALTH 60 each PRN  . docusate sodium (COLACE) 100 MG capsule Take 1 capsule (100 mg total) by mouth 2 (two) times daily. 10 capsule 0  . enoxaparin (LOVENOX) 40 MG/0.4ML injection Inject 0.4 mLs (40 mg total) into the skin daily. 14 Syringe 0  . guaiFENesin (ROBITUSSIN) 100 MG/5ML liquid Take 200 mg by mouth  3 (three) times daily as needed for cough.    . loperamide (IMODIUM) 2 MG capsule Take by mouth as needed for diarrhea or loose stools.    Marland Kitchen LORazepam (ATIVAN) 0.5 MG tablet Take 1 tablet (0.5 mg total) by mouth every 6 (six) hours as needed for anxiety. 20 tablet 0  . magnesium hydroxide (MILK OF MAGNESIA) 400 MG/5ML suspension Take 15 mLs by mouth daily as needed for mild constipation.    Marland Kitchen MAPAP 500 MG tablet TAKE 2 TABLETS (1000 MG) BY MOUTH TWICE DAILY AS NEEDED FOR DISCOMFORT OR FEVER 60 tablet 11  . omeprazole (PRILOSEC) 40 MG capsule TAKE 1 CAPSULE BY MOUTH ONCE DAILY 90 capsule 1  . oxyCODONE (OXY IR/ROXICODONE) 5 MG immediate release tablet Take 1 tablet (5 mg total) by mouth every 4 (four) hours as needed for moderate pain. 30 tablet 0  . QUEtiapine (SEROQUEL) 50 MG tablet TAKE 1 TABLET BY MOUTH 1 HOUR BEFORE BEDTIME DAILY 30 tablet 8  . Vitamin D, Ergocalciferol, (DRISDOL) 50000 UNITS CAPS capsule Take 50,000 Units by mouth every 7 (seven) days.     No facility-administered medications prior to visit.    Review of Systems;  Patient denies headache, fevers, malaise, unintentional weight loss, skin rash, eye pain, sinus congestion and sinus pain, sore throat, dysphagia,  hemoptysis , cough, dyspnea, wheezing, chest pain, palpitations, orthopnea,  edema, abdominal pain, nausea, melena, diarrhea, constipation, flank pain, dysuria, hematuria, urinary  Frequency, nocturia, numbness, tingling, seizures,  Focal weakness, Loss of consciousness,  Tremor, insomnia, depression, anxiety, and suicidal ideation.      Objective:  BP 108/70 mmHg  Pulse 69  Temp(Src) 97.4 F (36.3 C) (Oral)  Ht 5\' 2"  (1.575 m)  Wt 167 lb (75.751 kg)  BMI 30.54 kg/m2  SpO2 97%  BP Readings from Last 3 Encounters:  05/10/15 108/70  05/10/15 140/88  03/21/15 120/82    Wt Readings from Last 3 Encounters:  05/10/15 167 lb (75.751 kg)  05/09/15 160 lb (72.576 kg)  03/18/15 167 lb 9.6 oz (76.023 kg)     General appearance: alert, cooperative and appears stated age Ears: normal TM's and external ear canals both ears Throat: lips, mucosa, and tongue normal; teeth and gums normal Neck: no adenopathy, no carotid bruit, supple, symmetrical, trachea midline and thyroid not enlarged, symmetric, no tenderness/mass/nodules Back: symmetric, no curvature. ROM normal. No CVA tenderness. Lungs: clear to auscultation bilaterally Heart: regular rate and rhythm, S1, S2 normal, no murmur, click, rub or gallop Abdomen: soft, non-tender; bowel sounds normal; no masses,  no organomegaly Pulses: 2+ and symmetric Skin: Skin color, texture, turgor normal. No rashes or lesions Lymph nodes: Cervical, supraclavicular, and axillary nodes normal.  No results found for: HGBA1C  Lab Results  Component Value Date   CREATININE 0.97 03/20/2015   CREATININE 0.94 03/19/2015   CREATININE 1.14* 03/18/2015    Lab Results  Component Value Date   WBC 8.7 03/20/2015   HGB 13.2 03/20/2015   HCT 39.5 03/20/2015   PLT 184 03/20/2015   GLUCOSE 144* 03/20/2015   CHOL 176 05/04/2012   TRIG 95.0 05/04/2012   HDL 55.70 05/04/2012   LDLCALC 101* 05/04/2012   ALT 24 2020/10/1613   AST 31 2020/10/1613   NA 138 03/20/2015   K 4.0 03/20/2015   CL 107 03/20/2015   CREATININE 0.97 03/20/2015   BUN 13 03/20/2015   CO2 27 03/20/2015   TSH 3.17 05/31/2013    Dg Hip Unilat With Pelvis 2-3 Views Right  05/09/2015  CLINICAL DATA:  Unwitnessed fall.  Pain. EXAM: DG HIP (WITH OR WITHOUT PELVIS) 2-3V RIGHT COMPARISON:  RIGHT hip films 03/17/2015. FINDINGS: There is no evidence of hip fracture or dislocation. There is no evidence of arthropathy or other focal bone abnormality. Three cannulated screws have been placed across a femoral neck fracture. IMPRESSION: No acute findings.  Satisfactory postoperative appearance. Electronically Signed   By: Elsie Stain M.D.   On: 05/09/2015 23:53    Assessment & Plan:   Problem List Items  Addressed This Visit    Vitamin D deficiency    Continue weekly Drisdol for initial level of < 10 and facility long term residence       Weight gain - Primary    Secondary to inactivity and diet,  Aggravated by dementia.       Recurrent falls while walking    Secondary to decreased sense of safety and balance due to vascular dementia.  S/p right hip fracture Dec 18th with surgical repair.  Send fall in Feb 2017 , no fractures . Will reduce percocet to vicodin as this may be contributing       A total of 25 minutes of face to face time was spent with patient and family, ore than half of which was spent in counselling about the above mentioned conditions  and coordination of care  I am having Ms. Bickham start on HYDROcodone-acetaminophen. I am also having her maintain her QUEtiapine, MAPAP, guaiFENesin, bismuth subsalicylate, alum & mag hydroxide-simeth, loperamide, magnesium hydroxide, Cranberry Fruit, omeprazole, Vitamin D (Ergocalciferol), enoxaparin, oxyCODONE, docusate sodium, and LORazepam.  Meds ordered this encounter  Medications  . HYDROcodone-acetaminophen (NORCO/VICODIN) 5-325 MG tablet    Sig: Take 1 tablet by mouth at bedtime.    Dispense:  30 tablet    Refill:  0    There are no discontinued medications.  Follow-up: Return in about 6 months (around 11/07/2015).   Sherlene Shams, MD

## 2015-05-10 NOTE — Patient Instructions (Signed)
D/C Percocet  Schedule 1000 mg tylenol twice daily  vicodin 5/325 one tablet at bedtime.  Let me know if pain is not controlled.    Lower bed height

## 2015-05-10 NOTE — ED Notes (Signed)
Contacted patient don for discharge disposition transportation. Awaiting son to arrive to ER.

## 2015-05-10 NOTE — Discharge Instructions (Signed)
You have been seen in the Emergency Department (ED) today for a fall.  Your work up does not show any concerning injuries.  Please take over-the-counter ibuprofen and/or Tylenol as needed for your pain (unless you have an allergy or your doctor as told you not to take them), or take any prescribed medication as instructed.  Please follow up with your doctor regarding today's Emergency Department (ED) visit and your recent fall.    Return to the ED if you have any headache, confusion, slurred speech, weakness/numbness of any arm or leg, or any increased pain.   Fall Prevention in the Home  Falls can cause injuries and can affect people from all age groups. There are many simple things that you can do to make your home safe and to help prevent falls. WHAT CAN I DO ON THE OUTSIDE OF MY HOME?  Regularly repair the edges of walkways and driveways and fix any cracks.  Remove high doorway thresholds.  Trim any shrubbery on the main path into your home.  Use [place] outdoor lighting.  Clear walkways of debris and clutter, including tools and rocks.  Regularly check that handrails are securely fastened and in good repair. Both sides of any steps should have handrails.  Install guardrails along the edges of any raised decks or porches.  Have leaves, snow, and ice cleared regularly.  Use sand or salt on walkways during winter months.  In the garage, clean up any spills right away, including grease or oil spills. WHAT CAN I DO IN THE BATHROOM?  Use night lights.  Install grab bars by the toilet and in the tub and shower. Do not use towel bars as grab bars.  Use non-skid mats or decals on the floor of the tub or shower.  If you need to sit down while you are in the shower, use a plastic, non-slip stool.Marland Kitchen  Keep the floor dry. Immediately clean up any water that spills on the floor.  Remove soap buildup in the tub or shower on a regular basis.  Attach bath mats securely with double-sided  non-slip rug tape.  Remove throw rugs and other tripping hazards from the floor. WHAT CAN I DO IN THE BEDROOM?  Use night lights.  Make sure that a bedside light is easy to reach.  Do not use oversized bedding that drapes onto the floor.  Have a firm chair that has side arms to use for getting dressed.  Remove throw rugs and other tripping hazards from the floor. WHAT CAN I DO IN THE KITCHEN?   Clean up any spills right away.  Avoid walking on wet floors.  Place frequently used items in easy-to-reach places.  If you need to reach for something above you, use a sturdy step stool that has a grab bar.  Keep electrical cables out of the way.  Do not use floor polish or wax that makes floors slippery. If you have to use wax, make sure that it is non-skid floor wax.  Remove throw rugs and other tripping hazards from the floor. WHAT CAN I DO IN THE STAIRWAYS?  Do not leave any items on the stairs.  Make sure that there are handrails on both sides of the stairs. Fix handrails that are broken or loose. Make sure that handrails are as long as the stairways.  Check any carpeting to make sure that it is firmly attached to the stairs. Fix any carpet that is loose or worn.  Avoid having throw rugs at the  top or bottom of stairways, or secure the rugs with carpet tape to prevent them from moving.  Make sure that you have a light switch at the top of the stairs and the bottom of the stairs. If you do not have them, have them installed. WHAT ARE SOME OTHER FALL PREVENTION TIPS?  Wear closed-toe shoes that fit well and support your feet. Wear shoes that have rubber soles or low heels.  When you use a stepladder, make sure that it is completely opened and that the sides are firmly locked. Have someone hold the ladder while you are using it. Do not climb a closed stepladder.  Add color or contrast paint or tape to grab bars and handrails in your home. Place contrasting color strips on the  first and last steps.  Use mobility aids as needed, such as canes, walkers, scooters, and crutches.  Turn on lights if it is dark. Replace any light bulbs that burn out.  Set up furniture so that there are clear paths. Keep the furniture in the same spot.  Fix any uneven floor surfaces.  Choose a carpet design that does not hide the edge of steps of a stairway.  Be aware of any and all pets.  Review your medicines with your healthcare provider. Some medicines can cause dizziness or changes in blood pressure, which increase your risk of falling. Talk with your health care provider about other ways that you can decrease your risk of falls. This may include working with a physical therapist or trainer to improve your strength, balance, and endurance.   This information is not intended to replace advice given to you by your health care provider. Make sure you discuss any questions you have with your health care provider.   Document Released: 03/07/2002 Document Revised: 08/01/2014 Document Reviewed: 04/21/2014 Elsevier Interactive Patient Education 2016 Elsevier Inc[employer]  Dementia Dementia is a general term for problems with brain function. A person with dementia has memory loss and a hard time with at least one other brain function such as thinking, speaking, or problem solving. Dementia can affect social functioning, how you do your job, your mood, or your personality. The changes may be hidden for a long time. The earliest forms of this disease are usually not detected by family or friends. Dementia can be:  Irreversible.  Potentially reversible.  Partially reversible.  Progressive. This means it can get worse over time. CAUSES  Irreversible dementia causes may include:  Degeneration of brain cells (Alzheimer disease or Lewy body dementia).  Multiple small strokes (vascular dementia).  Infection (chronic meningitis or Creutzfeldt-Jakob disease).  Frontotemporal dementia. This  affects younger people, age 42 to 68, compared to those who have Alzheimer disease.  Dementia associated with other disorders like Parkinson disease, Huntington disease, or HIV-associated dementia. Potentially or partially reversible dementia causes may include:  Medicines.  Metabolic causes such as excessive alcohol intake, vitamin B12 deficiency, or thyroid disease.  Masses or pressure in the brain such as a tumor, blood clot, or hydrocephalus. SIGNS AND SYMPTOMS  Symptoms are often hard to detect. Family members or coworkers may not notice them early in the disease process. Different people with dementia may have different symptoms. Symptoms can include:  A hard time with memory, especially recent memory. Long-term memory may not be impaired.  Asking the same question multiple times or forgetting something someone just said.  A hard time speaking your thoughts or finding certain words.  A hard time solving problems or performing familiar  tasks (such as how to use a telephone).  Sudden changes in mood.  Changes in personality, especially increasing moodiness or mistrust.  Depression.  A hard time understanding complex ideas that were never a problem in the past. DIAGNOSIS  There are no specific tests for dementia.   Your health care provider may recommend a thorough evaluation. This is because some forms of dementia can be reversible. The evaluation will likely include a physical exam and getting a detailed history from you and a family member. The history often gives the best clues and suggestions for a diagnosis.  Memory testing may be done. A detailed brain function evaluation called neuropsychologic testing may be helpful.  Lab tests and brain imaging (such as a CT scan or MRI scan) are sometimes important.  Sometimes observation and re-evaluation over time is very helpful. TREATMENT  Treatment depends on the cause.   If the problem is a vitamin deficiency, it may be  helped or cured with supplements.  For dementias such as Alzheimer disease, medicines are available to stabilize or slow the course of the disease. There are no cures for this type of dementia.  Your health care provider can help direct you to groups, organizations, and other health care providers to help with decisions in the care of you or your loved one. HOME CARE INSTRUCTIONS The care of individuals with dementia is varied and dependent upon the progression of the dementia. The following suggestions are intended for the person living with, or caring for, the person with dementia.  Create a safe environment.  Remove the locks on bathroom doors to prevent the person from accidentally locking himself or herself in.  Use childproof latches on kitchen cabinets and any place where cleaning supplies, chemicals, or alcohol are kept.  Use childproof covers in unused electrical outlets.  Install childproof devices to keep doors and windows secured.  Remove stove knobs or install safety knobs and an automatic shut-off on the stove.  Lower the temperature on water heaters.  Label medicines and keep them locked up.  Secure knives, lighters, matches, power tools, and guns, and keep these items out of reach.  Keep the house free from clutter. Remove rugs or anything that might contribute to a fall.  Remove objects that might break and hurt the person.  Make sure lighting is good, both inside and outside.  Install grab rails as needed.  Use a monitoring device to alert you to falls or other needs for help.  Reduce confusion.  Keep familiar objects and people around.  Use night lights or dim lights at night.  Label items or areas.  Use reminders, notes, or directions for daily activities or tasks.  Keep a simple, consistent routine for waking, meals, bathing, dressing, and bedtime.  Create a calm, quiet environment.  Place large clocks and calendars prominently.  Display  emergency numbers and home address near all telephones.  Use cues to establish different times of the day. An example is to open curtains to let the natural light in during the day.   Use effective communication.  Choose simple words and short sentences.  Use a gentle, calm tone of voice.  Be careful not to interrupt.  If the person is struggling to find a word or communicate a thought, try to provide the word or thought.  Ask one question at a time. Allow the person ample time to answer questions. Repeat the question again if the person does not respond.  Reduce nighttime restlessness.  Provide a comfortable bed.  Have a consistent nighttime routine.  Ensure a regular walking or physical activity schedule. Involve the person in daily activities as much as possible.  Limit napping during the day.  Limit caffeine.  Attend social events that stimulate rather than overwhelm the senses.  Encourage good nutrition and hydration.  Reduce distractions during meal times and snacks.  Avoid foods that are too hot or too cold.  Monitor chewing and swallowing ability.  Continue with routine vision, hearing, dental, and medical screenings.  Give medicines only as directed by the health care provider.  Monitor driving abilities. Do not allow the person to drive when safe driving is no longer possible.  Register with an identification program which could provide location assistance in the event of a missing person situation. SEEK MEDICAL CARE IF:   New behavioral problems start such as moodiness, aggressiveness, or seeing things that are not there (hallucinations).  Any new problem with brain function happens. This includes problems with balance, speech, or falling a lot.  Problems with swallowing develop.  Any symptoms of other illness happen. Small changes or worsening in any aspect of brain function can be a sign that the illness is getting worse. It can also be a sign of  another medical illness such as infection. Seeing a health care provider right away is important. SEEK IMMEDIATE MEDICAL CARE IF:   A fever develops.  New or worsened confusion develops.  New or worsened sleepiness develops.  Staying awake becomes hard to do.   This information is not intended to replace advice given to you by your health care provider. Make sure you discuss any questions you have with your health care provider.   Document Released: 09/10/2000 Document Revised: 04/07/2014 Document Reviewed: 08/12/2010 Elsevier Interactive Patient Education [employer]

## 2015-05-10 NOTE — Progress Notes (Signed)
Pre-visit discussion using our clinic review tool. No additional management support is needed unless otherwise documented below in the visit note.  

## 2015-05-10 NOTE — ED Notes (Signed)
Family member (son) with no complaints at this time. Respirations even and unlabored. Skin warm/dry. Discharge instructions reviewed with patient and family member at this time. Family member given opportunity to voice concerns/ask questions. Patient discharged at this time and left Emergency Department, via wheelchair.

## 2015-05-12 ENCOUNTER — Encounter: Payer: Self-pay | Admitting: Internal Medicine

## 2015-05-12 NOTE — Assessment & Plan Note (Signed)
Continue weekly Drisdol for initial level of < 10 and facility long term residence

## 2015-05-12 NOTE — Assessment & Plan Note (Signed)
Secondary to inactivity and diet,  Aggravated by dementia.

## 2015-05-12 NOTE — Assessment & Plan Note (Addendum)
Secondary to decreased sense of safety and balance due to vascular dementia.  S/p right hip fracture Dec 18th with surgical repair.  Send fall in Feb 2017 , no fractures . Will reduce percocet to vicodin as this may be contributing

## 2015-05-14 DIAGNOSIS — F419 Anxiety disorder, unspecified: Secondary | ICD-10-CM | POA: Diagnosis not present

## 2015-05-14 DIAGNOSIS — F0391 Unspecified dementia with behavioral disturbance: Secondary | ICD-10-CM | POA: Diagnosis not present

## 2015-05-14 DIAGNOSIS — S72001D Fracture of unspecified part of neck of right femur, subsequent encounter for closed fracture with routine healing: Secondary | ICD-10-CM | POA: Diagnosis not present

## 2015-05-14 DIAGNOSIS — Z9181 History of falling: Secondary | ICD-10-CM | POA: Diagnosis not present

## 2015-05-14 DIAGNOSIS — M6281 Muscle weakness (generalized): Secondary | ICD-10-CM | POA: Diagnosis not present

## 2015-05-16 DIAGNOSIS — F419 Anxiety disorder, unspecified: Secondary | ICD-10-CM | POA: Diagnosis not present

## 2015-05-16 DIAGNOSIS — F0391 Unspecified dementia with behavioral disturbance: Secondary | ICD-10-CM | POA: Diagnosis not present

## 2015-05-16 DIAGNOSIS — M6281 Muscle weakness (generalized): Secondary | ICD-10-CM | POA: Diagnosis not present

## 2015-05-16 DIAGNOSIS — Z9181 History of falling: Secondary | ICD-10-CM | POA: Diagnosis not present

## 2015-05-16 DIAGNOSIS — S72001D Fracture of unspecified part of neck of right femur, subsequent encounter for closed fracture with routine healing: Secondary | ICD-10-CM | POA: Diagnosis not present

## 2015-05-17 DIAGNOSIS — Z9181 History of falling: Secondary | ICD-10-CM | POA: Diagnosis not present

## 2015-05-17 DIAGNOSIS — M6281 Muscle weakness (generalized): Secondary | ICD-10-CM | POA: Diagnosis not present

## 2015-05-17 DIAGNOSIS — F0391 Unspecified dementia with behavioral disturbance: Secondary | ICD-10-CM | POA: Diagnosis not present

## 2015-05-17 DIAGNOSIS — F419 Anxiety disorder, unspecified: Secondary | ICD-10-CM | POA: Diagnosis not present

## 2015-05-17 DIAGNOSIS — S72001D Fracture of unspecified part of neck of right femur, subsequent encounter for closed fracture with routine healing: Secondary | ICD-10-CM | POA: Diagnosis not present

## 2015-05-21 DIAGNOSIS — F0391 Unspecified dementia with behavioral disturbance: Secondary | ICD-10-CM | POA: Diagnosis not present

## 2015-05-21 DIAGNOSIS — M6281 Muscle weakness (generalized): Secondary | ICD-10-CM | POA: Diagnosis not present

## 2015-05-21 DIAGNOSIS — F419 Anxiety disorder, unspecified: Secondary | ICD-10-CM | POA: Diagnosis not present

## 2015-05-21 DIAGNOSIS — S72001D Fracture of unspecified part of neck of right femur, subsequent encounter for closed fracture with routine healing: Secondary | ICD-10-CM | POA: Diagnosis not present

## 2015-05-21 DIAGNOSIS — Z9181 History of falling: Secondary | ICD-10-CM | POA: Diagnosis not present

## 2015-05-22 DIAGNOSIS — M79675 Pain in left toe(s): Secondary | ICD-10-CM | POA: Diagnosis not present

## 2015-05-22 DIAGNOSIS — B351 Tinea unguium: Secondary | ICD-10-CM | POA: Diagnosis not present

## 2015-05-22 DIAGNOSIS — M79674 Pain in right toe(s): Secondary | ICD-10-CM | POA: Diagnosis not present

## 2015-05-23 DIAGNOSIS — F419 Anxiety disorder, unspecified: Secondary | ICD-10-CM | POA: Diagnosis not present

## 2015-05-23 DIAGNOSIS — F0391 Unspecified dementia with behavioral disturbance: Secondary | ICD-10-CM | POA: Diagnosis not present

## 2015-05-23 DIAGNOSIS — M6281 Muscle weakness (generalized): Secondary | ICD-10-CM | POA: Diagnosis not present

## 2015-05-23 DIAGNOSIS — Z9181 History of falling: Secondary | ICD-10-CM | POA: Diagnosis not present

## 2015-05-23 DIAGNOSIS — S72001D Fracture of unspecified part of neck of right femur, subsequent encounter for closed fracture with routine healing: Secondary | ICD-10-CM | POA: Diagnosis not present

## 2015-05-28 DIAGNOSIS — S72001D Fracture of unspecified part of neck of right femur, subsequent encounter for closed fracture with routine healing: Secondary | ICD-10-CM | POA: Diagnosis not present

## 2015-05-28 DIAGNOSIS — F0391 Unspecified dementia with behavioral disturbance: Secondary | ICD-10-CM | POA: Diagnosis not present

## 2015-05-28 DIAGNOSIS — M6281 Muscle weakness (generalized): Secondary | ICD-10-CM | POA: Diagnosis not present

## 2015-05-28 DIAGNOSIS — F419 Anxiety disorder, unspecified: Secondary | ICD-10-CM | POA: Diagnosis not present

## 2015-05-28 DIAGNOSIS — Z9181 History of falling: Secondary | ICD-10-CM | POA: Diagnosis not present

## 2015-05-30 DIAGNOSIS — F419 Anxiety disorder, unspecified: Secondary | ICD-10-CM | POA: Diagnosis not present

## 2015-05-30 DIAGNOSIS — M6281 Muscle weakness (generalized): Secondary | ICD-10-CM | POA: Diagnosis not present

## 2015-05-30 DIAGNOSIS — Z9181 History of falling: Secondary | ICD-10-CM | POA: Diagnosis not present

## 2015-05-30 DIAGNOSIS — F0391 Unspecified dementia with behavioral disturbance: Secondary | ICD-10-CM | POA: Diagnosis not present

## 2015-05-30 DIAGNOSIS — S72001D Fracture of unspecified part of neck of right femur, subsequent encounter for closed fracture with routine healing: Secondary | ICD-10-CM | POA: Diagnosis not present

## 2015-06-06 ENCOUNTER — Other Ambulatory Visit: Payer: Self-pay

## 2015-06-06 MED ORDER — LORAZEPAM 0.5 MG PO TABS: 0.5000 mg | ORAL_TABLET | Freq: Four times a day (QID) | ORAL | Status: DC | PRN

## 2015-06-07 ENCOUNTER — Emergency Department
Admission: EM | Admit: 2015-06-07 | Discharge: 2015-06-07 | Disposition: A | Discharge status: Home / Self Care | Payer: Medicare Other | Attending: Student | Admitting: Student

## 2015-06-07 ENCOUNTER — Encounter: Payer: Self-pay | Admitting: Emergency Medicine

## 2015-06-07 ENCOUNTER — Emergency Department: Payer: Medicare Other

## 2015-06-07 DIAGNOSIS — IMO0002 Reserved for concepts with insufficient information to code with codable children: Secondary | ICD-10-CM

## 2015-06-07 DIAGNOSIS — Y92001 Dining room of unspecified non-institutional (private) residence as the place of occurrence of the external cause: Secondary | ICD-10-CM | POA: Diagnosis not present

## 2015-06-07 DIAGNOSIS — S0101XA Laceration without foreign body of scalp, initial encounter: Secondary | ICD-10-CM | POA: Diagnosis not present

## 2015-06-07 DIAGNOSIS — S199XXA Unspecified injury of neck, initial encounter: Secondary | ICD-10-CM | POA: Diagnosis not present

## 2015-06-07 DIAGNOSIS — F0390 Unspecified dementia without behavioral disturbance: Secondary | ICD-10-CM | POA: Insufficient documentation

## 2015-06-07 DIAGNOSIS — S0990XA Unspecified injury of head, initial encounter: Secondary | ICD-10-CM

## 2015-06-07 DIAGNOSIS — Z7901 Long term (current) use of anticoagulants: Secondary | ICD-10-CM | POA: Diagnosis not present

## 2015-06-07 DIAGNOSIS — S0181XA Laceration without foreign body of other part of head, initial encounter: Secondary | ICD-10-CM | POA: Diagnosis not present

## 2015-06-07 DIAGNOSIS — Y998 Other external cause status: Secondary | ICD-10-CM | POA: Diagnosis not present

## 2015-06-07 DIAGNOSIS — S29001A Unspecified injury of muscle and tendon of front wall of thorax, initial encounter: Secondary | ICD-10-CM | POA: Insufficient documentation

## 2015-06-07 DIAGNOSIS — Y9389 Activity, other specified: Secondary | ICD-10-CM | POA: Insufficient documentation

## 2015-06-07 DIAGNOSIS — Z79899 Other long term (current) drug therapy: Secondary | ICD-10-CM | POA: Insufficient documentation

## 2015-06-07 DIAGNOSIS — R079 Chest pain, unspecified: Secondary | ICD-10-CM | POA: Diagnosis not present

## 2015-06-07 DIAGNOSIS — W01198A Fall on same level from slipping, tripping and stumbling with subsequent striking against other object, initial encounter: Secondary | ICD-10-CM | POA: Insufficient documentation

## 2015-06-07 DIAGNOSIS — W19XXXA Unspecified fall, initial encounter: Secondary | ICD-10-CM | POA: Diagnosis not present

## 2015-06-07 MED ORDER — BACITRACIN ZINC 500 UNIT/GM EX OINT
TOPICAL_OINTMENT | CUTANEOUS | Status: AC
  Filled 2015-06-07: qty 1.8

## 2015-06-07 MED ORDER — ONDANSETRON 4 MG PO TBDP
ORAL_TABLET | ORAL | Status: AC
  Administered 2015-06-07: 4 mg
  Filled 2015-06-07: qty 1

## 2015-06-07 MED ORDER — LIDOCAINE-EPINEPHRINE (PF) 1 %-1:200000 IJ SOLN
INTRAMUSCULAR | Status: AC
  Filled 2015-06-07: qty 30

## 2015-06-07 MED ORDER — BACITRACIN ZINC 500 UNIT/GM EX OINT
TOPICAL_OINTMENT | Freq: Once | CUTANEOUS | Status: AC
  Administered 2015-06-07: 15:00:00 via TOPICAL

## 2015-06-07 NOTE — ED Provider Notes (Signed)
[facility] Emergency Department Provider Note  ____________________________________________  Time seen: Approximately 1:22 PM  I have reviewed the triage vital signs and the nursing notes.   HISTORY  Chief Complaint Fall  Caveat-history of present illness and review of systems Limited secondary to dementia. Information is obtained from EMS on arrival.  HPI Catherine Meyers is a 82 y.o. female with history of dementia, frequent falls who presents for evaluation of head injury after a witnessed fall at her living facility today. Per report, the patient was in relating around the dining table with her walker when she tripped and fell forward hitting her head on the table. She did not lose consciousness. No vomiting. On EMS arrival she was awake and alert and in no distress. Patient denies any additional pain complaints at this time.   Past Medical History  Diagnosis Date  . Migraine   . Hepatitis   . Hepatitis, water-borne 1965  . Arthritis     left knee  . Dementia   . Vitamin D deficiency disease   . Osteopenia   . Dementia     Patient Active Problem List   Diagnosis Date Noted  . Femoral neck fracture, right, closed, initial encounter 03/17/2015  . Recurrent falls while walking 11/05/2014  . Do not resuscitate status 11/05/2014  . Dementia with behavioral disturbance   . Weight gain 09/02/2014  . Sprain of ankle 11/04/2013  . Fall at home 11/04/2013  . Acute renal failure (HCC) 11/04/2013  . Osteopenia of the elderly 11/04/2013  . Encounter for preventive health examination 06/02/2013  . Vascular dementia with depressed mood 05/30/2012  . Vitamin D deficiency 05/05/2012  . Depression 05/04/2012    Past Surgical History  Procedure Laterality Date  . Abdominal hysterectomy  1986  . Knee arthroscopy  2003  . Hip pinning,cannulated Right 03/18/2015    Procedure: CANNULATED HIP PINNING;  Surgeon: Kennedy Bucker, MD;  Location: [facility] ORS;  Service:  Orthopedics;  Laterality: Right;    Current Outpatient Rx  Name  Route  Sig  Dispense  Refill  . alum & mag hydroxide-simeth (MAALOX/MYLANTA) 200-200-20 MG/5ML suspension   Oral   Take 15 mLs by mouth every 6 (six) hours as needed for indigestion or heartburn.         . bismuth subsalicylate (PEPTO BISMOL) 262 MG/15ML suspension   Oral   Take 30 mLs by mouth every 6 (six) hours as needed.         . Cranberry Fruit 405 MG CAPS      TAKE 1 CAPSULE BY MOUTH TWICE DAILY FOR URINARY HEALTH   60 each   PRN     TO CONTINUE THIS MEDICINE WE WILL NEED REFILLS AUT ...   . docusate sodium (COLACE) 100 MG capsule   Oral   Take 1 capsule (100 mg total) by mouth 2 (two) times daily.   10 capsule   0   . enoxaparin (LOVENOX) 40 MG/0.4ML injection   Subcutaneous   Inject 0.4 mLs (40 mg total) into the skin daily.   14 Syringe   0   . guaiFENesin (ROBITUSSIN) 100 MG/5ML liquid   Oral   Take 200 mg by mouth 3 (three) times daily as needed for cough.         Marland Kitchen HYDROcodone-acetaminophen (NORCO/VICODIN) 5-325 MG tablet   Oral   Take 1 tablet by mouth at bedtime.   30 tablet   0   . loperamide (IMODIUM) 2 MG capsule  Oral   Take by mouth as needed for diarrhea or loose stools.         Marland Kitchen LORazepam (ATIVAN) 0.5 MG tablet   Oral   Take 1 tablet (0.5 mg total) by mouth every 6 (six) hours as needed for anxiety.   20 tablet   0   . magnesium hydroxide (MILK OF MAGNESIA) 400 MG/5ML suspension   Oral   Take 15 mLs by mouth daily as needed for mild constipation.         Marland Kitchen MAPAP 500 MG tablet      TAKE 2 TABLETS (1000 MG) BY MOUTH TWICE DAILY AS NEEDED FOR DISCOMFORT OR FEVER   60 tablet   11   . omeprazole (PRILOSEC) 40 MG capsule      TAKE 1 CAPSULE BY MOUTH ONCE DAILY   90 capsule   1   . oxyCODONE (OXY IR/ROXICODONE) 5 MG immediate release tablet   Oral   Take 1 tablet (5 mg total) by mouth every 4 (four) hours as needed for moderate pain.   30 tablet   0    . QUEtiapine (SEROQUEL) 50 MG tablet      TAKE 1 TABLET BY MOUTH 1 HOUR BEFORE BEDTIME DAILY   30 tablet   8   . Vitamin D, Ergocalciferol, (DRISDOL) 50000 UNITS CAPS capsule   Oral   Take 50,000 Units by mouth every 7 (seven) days.           Allergies Review of patient's allergies indicates no known allergies.  Family History  Problem Relation Age of Onset  . Cancer Sister     breast    Social History Social History  Substance Use Topics  . Smoking status: Never Smoker   . Smokeless tobacco: None  . Alcohol Use: No    Review of Systems  Caveat-history of present illness and review of systems Limited secondary to dementia. Information is obtained from EMS on arrival. ____________________________________________   PHYSICAL EXAM:  VITAL SIGNS: ED Triage Vitals  Enc Vitals Group     BP 06/07/15 1316 166/77 mmHg     Pulse Rate 06/07/15 1316 66     Resp 06/07/15 1316 18     Temp 06/07/15 1316 97.7 F (36.5 C)     Temp Source 06/07/15 1316 Oral     SpO2 06/07/15 1316 98 %     Weight 06/07/15 1316 168 lb (76.204 kg)     Height 06/07/15 1316  (1.626 m)     Head Cir --      Peak Flow --      Pain Score 06/07/15 1317 0     Pain Loc --      Pain Edu? --      Excl. in GC? --     Constitutional: Alert and oriented to self only. Well appearing and in no acute distress.  Pleasantly demented Eyes: Conjunctivae are normal. PERRL. EOMI. Head:large 10 cm laceration of the left scalp and forehead. Nose: No congestion/rhinnorhea. Mouth/Throat: Mucous membranes are moist.  Oropharynx non-erythematous. Neck: No stridor.  No cervical spine tenderness to palpation. Cardiovascular: Normal rate, regular rhythm. Grossly normal heart sounds.  Good peripheral circulation. Respiratory: Normal respiratory effort.  No retractions. Lungs CTAB. Gastrointestinal: Soft and nontender. No distention. No CVA tenderness. Genitourinary: deferred Musculoskeletal: No lower extremity  tenderness nor edema.  No joint effusions. Mild tender to palpation throughout the anterior chest wall bilaterally, no bony step-off, deformity, crepitus or flail chest. No midline  T or L-spine tenderness to palpation. Pelvis stable to rock and compression. Full painless active range of motion bilateral hip joints. Neurologic:  Normal speech and language. No gross focal neurologic deficits are appreciated.  Skin:  Skin is warm, dry and intact. No rash noted. Psychiatric: Mood and affect are normal. Speech and behavior are normal.  ____________________________________________   LABS (all labs ordered are listed, but only abnormal results are displayed)  Labs Reviewed - No data to display ____________________________________________  EKG  ED ECG REPORT I, Gayla DossGayle, Raquel Sayres A, the attending physician, personally viewed and interpreted this ECG.   Date: 06/07/2015  EKG Time: 13:24  Rate: 75  Rhythm: normal sinus rhythm with 1st degree av block  Axis: normal  Intervals:first-degree A-V block   ST&T Change: No acute ST elevation. PVC. Compared to the EKG performed on 05/09/2015, the exam is generally unchanged.  ____________________________________________  RADIOLOGY  CT head and c-spine IMPRESSION: 1. Frontal scalp laceration without underlying calvarial fracture. 2. No acute intracranial findings. Stable atrophy. 3. No evidence of acute cervical spine fracture, traumatic subluxation or static signs of instability. Multilevel spondylosis.  CXR  IMPRESSION: No active cardiopulmonary disease. Degenerative changes lower thoracic and lumbar spine. Thoracic spine osteopenia.  ____________________________________________   PROCEDURES  Procedure(s) performed:   LACERATION REPAIR Performed by: Gayla DossGayle, Delon Revelo A Authorized by: Toney RakesGayle, Terriyah Westra A Consent: Verbal consent obtained. Risks and benefits: risks, benefits and alternatives were discussed Consent given by: patient Patient identity  confirmed: provided demographic data Prepped and Draped in normal sterile fashion Wound explored  Laceration Location: forehead/left scalp  Laceration Length: 9 cm  No Foreign Bodies seen or palpated  Anesthesia: local infiltration  Local anesthetic: lidocaine 1% withepinephrine  Anesthetic total: 4  ml  Irrigation method: syringe Amount of cleaning: standard  Skin closure: staples x 5, 4-0 prolene simple interrupted stitches x 5  Number of sutures: as above  Technique: as above  Patient tolerance: Patient tolerated the procedure well with no immediate complications.  Critical Care performed: No  ____________________________________________   INITIAL IMPRESSION / ASSESSMENT AND PLAN / ED COURSE  Pertinent labs & imaging results that were available during my care of the patient were reviewed by me and considered in my Medical decision making (see chart for details).  Maylene RoesFay R Guiney is a 82 y.o. female with history of dementia, frequent falls who presents for evaluation of head injury after a witnessed fall at her living facility today. On exam, she is nontoxic appearing, in no acute distress, pleasantly demented. Vital signs are stable and she is afebrile. She has a large laceration associate with the left forehead and the left scalp that will require repair. She is also complaining of mild neck pain and had some faint tenderness to palpation the chest on exam. Plan for CT head, C-spine, chest x-ray and EKG. Reassess for disposition.  ----------------------------------------- 3:21 PM on 06/07/2015 ----------------------------------------- Laceration repaired as above. I discussed the case with the patient's son and her last tetanus vaccine was received within the past year. She is ambulatory with her walker and has no complaints. Imaging of her brain, neck and chest are unremarkable and EKG is essentially unchanged from prior. We'll DC with return precautions and close  follow-up.    ____________________________________________   FINAL CLINICAL IMPRESSION(S) / ED DIAGNOSES  Final diagnoses:  Fall, initial encounter  Head injury due to trauma, initial encounter  Laceration      Gayla DossEryka A Lilianne Delair, MD 06/07/15 1521

## 2015-06-07 NOTE — ED Notes (Signed)
Ambulated patient with walker in room.  Patient tolerated well.  Continue to monitor.

## 2015-06-07 NOTE — ED Notes (Signed)
[facility] [place] for witnessed fall in dining room.  Staff say that patient was walking with walker in dining room and tripped and fell forward hitting forehead on walker .   Laceration to forehead.  No LOC.

## 2015-06-07 NOTE — ED Notes (Signed)
AAO to baseline.  D/C home with daughter in law.

## 2015-06-07 NOTE — ED Notes (Signed)
Patient's son called and notified that patient was ready for discharge.  Son states that patient's daughter [place] her way to pick her up and should be at the hospital by 1545 - 1600.

## 2015-06-11 ENCOUNTER — Telehealth: Payer: Self-pay | Admitting: Internal Medicine

## 2015-06-11 NOTE — Telephone Encounter (Signed)
Please advise, thanks.

## 2015-06-11 NOTE — Telephone Encounter (Signed)
Sujkia [phone] called from [facility] regarding pt needing a DC order for HYDROcodone-acetaminophen (NORCO/VICODIN) 5-325 MG tablet. If medication is DC does pt need to continue the docusate sodium (COLACE) 100 MG capsule. Fax order to [phone] or [phone]. Pharmacy is [facility] [place], [place] - [address] Laurence FerrariSujkia states normally prescription gets faxed and someone picks up the hard copy. Thank you!

## 2015-06-11 NOTE — Telephone Encounter (Signed)
Order printed

## 2015-06-12 ENCOUNTER — Telehealth: Payer: Self-pay

## 2015-06-12 MED ORDER — ACETAMINOPHEN 500 MG PO TABS: 500.0000 mg | ORAL_TABLET | Freq: Four times a day (QID) | ORAL | Status: DC | PRN

## 2015-06-12 NOTE — Telephone Encounter (Signed)
Notified facility and faxed over new order.

## 2015-06-12 NOTE — Telephone Encounter (Signed)
[facility] is requesting for [name]'s Tylenol to be reduced from 1000mg  bid, to one 500mg  bid or PRN. DON feels it may be too much for [name]'s stomach.  Please advise

## 2015-06-12 NOTE — Telephone Encounter (Signed)
Order faxed as advised.

## 2015-06-12 NOTE — Telephone Encounter (Signed)
Will do,  But let him know that Tylenol does not affect stomach,  Only advil and aleve  do that . rx printed ,  Can use as order to fax over

## 2015-06-13 ENCOUNTER — Other Ambulatory Visit: Payer: Self-pay | Admitting: *Deleted

## 2015-06-13 MED ORDER — LORAZEPAM 0.5 MG PO TABS: ORAL_TABLET | ORAL | Status: DC

## 2015-06-13 NOTE — Telephone Encounter (Signed)
PRINTED

## 2015-06-13 NOTE — Telephone Encounter (Signed)
Sujkia from [facility] requested to have patient's Lorazepam ordered to be scheduled for 8pm.  Sujkia contact [phone]

## 2015-06-13 NOTE — Telephone Encounter (Signed)
Pt would like to receive this medication at bedtime. Please advise, thanks

## 2015-06-14 NOTE — Telephone Encounter (Signed)
[place] [facility] [facility] [place]

## 2015-06-23 DIAGNOSIS — F0391 Unspecified dementia with behavioral disturbance: Secondary | ICD-10-CM | POA: Diagnosis not present

## 2015-06-23 DIAGNOSIS — S0101XD Laceration without foreign body of scalp, subsequent encounter: Secondary | ICD-10-CM | POA: Diagnosis not present

## 2015-06-23 DIAGNOSIS — M6281 Muscle weakness (generalized): Secondary | ICD-10-CM | POA: Diagnosis not present

## 2015-06-23 DIAGNOSIS — Z8781 Personal history of (healed) traumatic fracture: Secondary | ICD-10-CM | POA: Diagnosis not present

## 2015-06-23 DIAGNOSIS — F419 Anxiety disorder, unspecified: Secondary | ICD-10-CM | POA: Diagnosis not present

## 2015-06-23 DIAGNOSIS — Z9181 History of falling: Secondary | ICD-10-CM | POA: Diagnosis not present

## 2015-06-25 DIAGNOSIS — F0391 Unspecified dementia with behavioral disturbance: Secondary | ICD-10-CM | POA: Diagnosis not present

## 2015-06-25 DIAGNOSIS — Z8781 Personal history of (healed) traumatic fracture: Secondary | ICD-10-CM | POA: Diagnosis not present

## 2015-06-25 DIAGNOSIS — Z9181 History of falling: Secondary | ICD-10-CM | POA: Diagnosis not present

## 2015-06-25 DIAGNOSIS — S0101XD Laceration without foreign body of scalp, subsequent encounter: Secondary | ICD-10-CM | POA: Diagnosis not present

## 2015-06-25 DIAGNOSIS — F419 Anxiety disorder, unspecified: Secondary | ICD-10-CM | POA: Diagnosis not present

## 2015-06-25 DIAGNOSIS — M6281 Muscle weakness (generalized): Secondary | ICD-10-CM | POA: Diagnosis not present

## 2015-06-26 ENCOUNTER — Telehealth: Payer: Self-pay | Admitting: Internal Medicine

## 2015-06-26 ENCOUNTER — Encounter: Payer: Self-pay | Admitting: Internal Medicine

## 2015-06-26 MED ORDER — PREDNISONE 10 MG PO TABS: ORAL_TABLET | ORAL | Status: DC

## 2015-06-26 MED ORDER — BENZONATATE 200 MG PO CAPS: 200.0000 mg | ORAL_CAPSULE | Freq: Three times a day (TID) | ORAL | Status: DC | PRN

## 2015-06-26 NOTE — Telephone Encounter (Signed)
Thanks. Notified Sujkia [facility]

## 2015-06-26 NOTE — Telephone Encounter (Signed)
Prednisone taper and Tessalon capsules ordered . KC in the future,   Don't worry about retyping unless you are ADDING info,  You actually gave me LESS INFO so I had to read both messages

## 2015-06-26 NOTE — Telephone Encounter (Signed)
Sujkia [phone] called from [facility] regarding pt having chest congestion with a cough symptoms for 3 days now. She wants to know if something can be called in? Pharmacy is [facility] [place], [place] - [address] Pt has been given standing order Robtussin but it's not working. Thank you!

## 2015-06-26 NOTE — Telephone Encounter (Signed)
Pt is having a productive a cough, body aches, chest congestion. Pt has been given tylenol and robitussin, but the cough medicine is not working. Catherine Meyers is requesting something stronger to help with the cough. Please advise, thanks

## 2015-06-27 DIAGNOSIS — F0391 Unspecified dementia with behavioral disturbance: Secondary | ICD-10-CM | POA: Diagnosis not present

## 2015-06-27 DIAGNOSIS — Z8781 Personal history of (healed) traumatic fracture: Secondary | ICD-10-CM | POA: Diagnosis not present

## 2015-06-27 DIAGNOSIS — Z9181 History of falling: Secondary | ICD-10-CM | POA: Diagnosis not present

## 2015-06-27 DIAGNOSIS — M6281 Muscle weakness (generalized): Secondary | ICD-10-CM | POA: Diagnosis not present

## 2015-06-27 DIAGNOSIS — F419 Anxiety disorder, unspecified: Secondary | ICD-10-CM | POA: Diagnosis not present

## 2015-06-27 DIAGNOSIS — S0101XD Laceration without foreign body of scalp, subsequent encounter: Secondary | ICD-10-CM | POA: Diagnosis not present

## 2015-06-27 NOTE — Telephone Encounter (Signed)
This request from facility was handled yesterday please confirm and let son know

## 2015-06-28 DIAGNOSIS — F419 Anxiety disorder, unspecified: Secondary | ICD-10-CM | POA: Diagnosis not present

## 2015-06-28 DIAGNOSIS — S0101XD Laceration without foreign body of scalp, subsequent encounter: Secondary | ICD-10-CM | POA: Diagnosis not present

## 2015-06-28 DIAGNOSIS — M6281 Muscle weakness (generalized): Secondary | ICD-10-CM | POA: Diagnosis not present

## 2015-06-28 DIAGNOSIS — F0391 Unspecified dementia with behavioral disturbance: Secondary | ICD-10-CM | POA: Diagnosis not present

## 2015-06-28 DIAGNOSIS — Z8781 Personal history of (healed) traumatic fracture: Secondary | ICD-10-CM | POA: Diagnosis not present

## 2015-06-28 DIAGNOSIS — Z9181 History of falling: Secondary | ICD-10-CM | POA: Diagnosis not present

## 2015-07-01 ENCOUNTER — Emergency Department
Admission: EM | Admit: 2015-07-01 | Discharge: 2015-07-01 | Disposition: A | Discharge status: Home / Self Care | Payer: Medicare Other | Attending: Emergency Medicine | Admitting: Emergency Medicine

## 2015-07-01 ENCOUNTER — Emergency Department: Payer: Medicare Other

## 2015-07-01 DIAGNOSIS — S62307A Unspecified fracture of fifth metacarpal bone, left hand, initial encounter for closed fracture: Secondary | ICD-10-CM | POA: Insufficient documentation

## 2015-07-01 DIAGNOSIS — S66921A Laceration of unspecified muscle, fascia and tendon at wrist and hand level, right hand, initial encounter: Secondary | ICD-10-CM | POA: Diagnosis not present

## 2015-07-01 DIAGNOSIS — Y9389 Activity, other specified: Secondary | ICD-10-CM | POA: Diagnosis not present

## 2015-07-01 DIAGNOSIS — Y998 Other external cause status: Secondary | ICD-10-CM | POA: Insufficient documentation

## 2015-07-01 DIAGNOSIS — S60221A Contusion of right hand, initial encounter: Secondary | ICD-10-CM | POA: Insufficient documentation

## 2015-07-01 DIAGNOSIS — Y929 Unspecified place or not applicable: Secondary | ICD-10-CM | POA: Insufficient documentation

## 2015-07-01 DIAGNOSIS — S0990XA Unspecified injury of head, initial encounter: Secondary | ICD-10-CM | POA: Diagnosis not present

## 2015-07-01 DIAGNOSIS — Z7901 Long term (current) use of anticoagulants: Secondary | ICD-10-CM | POA: Diagnosis not present

## 2015-07-01 DIAGNOSIS — W19XXXA Unspecified fall, initial encounter: Secondary | ICD-10-CM | POA: Diagnosis not present

## 2015-07-01 DIAGNOSIS — Z90710 Acquired absence of both cervix and uterus: Secondary | ICD-10-CM | POA: Insufficient documentation

## 2015-07-01 DIAGNOSIS — M79641 Pain in right hand: Secondary | ICD-10-CM | POA: Diagnosis not present

## 2015-07-01 DIAGNOSIS — K759 Inflammatory liver disease, unspecified: Secondary | ICD-10-CM | POA: Insufficient documentation

## 2015-07-01 DIAGNOSIS — Z79899 Other long term (current) drug therapy: Secondary | ICD-10-CM | POA: Diagnosis not present

## 2015-07-01 DIAGNOSIS — M1990 Unspecified osteoarthritis, unspecified site: Secondary | ICD-10-CM | POA: Diagnosis not present

## 2015-07-01 DIAGNOSIS — F0390 Unspecified dementia without behavioral disturbance: Secondary | ICD-10-CM | POA: Diagnosis not present

## 2015-07-01 DIAGNOSIS — S62346A Nondisplaced fracture of base of fifth metacarpal bone, right hand, initial encounter for closed fracture: Secondary | ICD-10-CM | POA: Diagnosis not present

## 2015-07-01 DIAGNOSIS — Z791 Long term (current) use of non-steroidal anti-inflammatories (NSAID): Secondary | ICD-10-CM | POA: Diagnosis not present

## 2015-07-01 DIAGNOSIS — M8580 Other specified disorders of bone density and structure, unspecified site: Secondary | ICD-10-CM | POA: Diagnosis not present

## 2015-07-01 DIAGNOSIS — S299XXA Unspecified injury of thorax, initial encounter: Secondary | ICD-10-CM | POA: Diagnosis not present

## 2015-07-01 DIAGNOSIS — S61411A Laceration without foreign body of right hand, initial encounter: Secondary | ICD-10-CM

## 2015-07-01 DIAGNOSIS — Z7952 Long term (current) use of systemic steroids: Secondary | ICD-10-CM | POA: Insufficient documentation

## 2015-07-01 DIAGNOSIS — S6991XA Unspecified injury of right wrist, hand and finger(s), initial encounter: Secondary | ICD-10-CM | POA: Diagnosis present

## 2015-07-01 DIAGNOSIS — G43909 Migraine, unspecified, not intractable, without status migrainosus: Secondary | ICD-10-CM | POA: Diagnosis not present

## 2015-07-01 DIAGNOSIS — S62337A Displaced fracture of neck of fifth metacarpal bone, left hand, initial encounter for closed fracture: Secondary | ICD-10-CM | POA: Diagnosis not present

## 2015-07-01 DIAGNOSIS — S61412A Laceration without foreign body of left hand, initial encounter: Secondary | ICD-10-CM | POA: Diagnosis not present

## 2015-07-01 DIAGNOSIS — S62306A Unspecified fracture of fifth metacarpal bone, right hand, initial encounter for closed fracture: Secondary | ICD-10-CM

## 2015-07-01 NOTE — ED Notes (Signed)
MD at bedside. 

## 2015-07-01 NOTE — ED Provider Notes (Signed)
[facility] Emergency Department Provider Note   ____________________________________________  Time seen: I have reviewed the triage vital signs and the triage nursing note.  HISTORY  Chief Complaint Fall   Historian Limited, patient with dementia History obtained from caregiver who was just nearby when the patient fell.  HPI Catherine Meyers is a 82 y.o. female with a history of dementia, caregiver reports that she was with her eating lunch and she stepped out for just a moment and she came back to find the patient having fallen to the side. The patient has a history of trying to get up even though she should have helped.  Unclear if there is head injury, patient is not complaining of a headache or any new altered mental status or confusion.  She has a skin tear to her right hand. She has bruising to her right hand.  She is reported to have a recent mild URI with a mild cough. No reported fevers vomiting or diarrhea.    Past Medical History  Diagnosis Date  . Migraine   . Hepatitis   . Hepatitis, water-borne 1965  . Arthritis     left knee  . Dementia   . Vitamin D deficiency disease   . Osteopenia   . Dementia     Patient Active Problem List   Diagnosis Date Noted  . Femoral neck fracture, right, closed, initial encounter 03/17/2015  . Recurrent falls while walking 11/05/2014  . Do not resuscitate status 11/05/2014  . Dementia with behavioral disturbance   . Weight gain 09/02/2014  . Sprain of ankle 11/04/2013  . Fall at home 11/04/2013  . Acute renal failure (HCC) 11/04/2013  . Osteopenia of the elderly 11/04/2013  . Encounter for preventive health examination 06/02/2013  . Vascular dementia with depressed mood 05/30/2012  . Vitamin D deficiency 05/05/2012  . Depression 05/04/2012    Past Surgical History  Procedure Laterality Date  . Abdominal hysterectomy  1986  . Knee arthroscopy  2003  . Hip pinning,cannulated Right 03/18/2015     Procedure: CANNULATED HIP PINNING;  Surgeon: Kennedy Bucker, MD;  Location: [facility] ORS;  Service: Orthopedics;  Laterality: Right;    Current Outpatient Rx  Name  Route  Sig  Dispense  Refill  . acetaminophen (MAPAP) 500 MG tablet   Oral   Take 1 tablet (500 mg total) by mouth every 6 (six) hours as needed.   60 tablet   11   . alum & mag hydroxide-simeth (MAALOX/MYLANTA) 200-200-20 MG/5ML suspension   Oral   Take 15 mLs by mouth every 6 (six) hours as needed for indigestion or heartburn.         . benzonatate (TESSALON) 200 MG capsule   Oral   Take 1 capsule (200 mg total) by mouth 3 (three) times daily as needed for cough.   60 capsule   1   . bismuth subsalicylate (PEPTO BISMOL) 262 MG/15ML suspension   Oral   Take 30 mLs by mouth every 6 (six) hours as needed.         . Cranberry Fruit 405 MG CAPS      TAKE 1 CAPSULE BY MOUTH TWICE DAILY FOR URINARY HEALTH   60 each   PRN     TO CONTINUE THIS MEDICINE WE WILL NEED REFILLS AUT ...   . docusate sodium (COLACE) 100 MG capsule   Oral   Take 1 capsule (100 mg total) by mouth 2 (two) times daily.   10 capsule  0   . enoxaparin (LOVENOX) 40 MG/0.4ML injection   Subcutaneous   Inject 0.4 mLs (40 mg total) into the skin daily.   14 Syringe   0   . guaiFENesin (ROBITUSSIN) 100 MG/5ML liquid   Oral   Take 200 mg by mouth 3 (three) times daily as needed for cough.         Marland Kitchen HYDROcodone-acetaminophen (NORCO/VICODIN) 5-325 MG tablet   Oral   Take 1 tablet by mouth at bedtime.   30 tablet   0   . loperamide (IMODIUM) 2 MG capsule   Oral   Take by mouth as needed for diarrhea or loose stools.         Marland Kitchen LORazepam (ATIVAN) 0.5 MG tablet      Please give patient one tablet by mouth every evening at 8 pm .  DO NOT GIVE IF PATIENT IS LETHARGIC OR   ALREADY SLEEPING   30 tablet   5   . magnesium hydroxide (MILK OF MAGNESIA) 400 MG/5ML suspension   Oral   Take 15 mLs by mouth daily as needed for mild  constipation.         Marland Kitchen omeprazole (PRILOSEC) 40 MG capsule      TAKE 1 CAPSULE BY MOUTH ONCE DAILY   90 capsule   1   . oxyCODONE (OXY IR/ROXICODONE) 5 MG immediate release tablet   Oral   Take 1 tablet (5 mg total) by mouth every 4 (four) hours as needed for moderate pain.   30 tablet   0   . predniSONE (DELTASONE) 10 MG tablet      6 tablets on Day 1 , then reduce by 1 tablet daily until gone   21 tablet   0   . QUEtiapine (SEROQUEL) 50 MG tablet      TAKE 1 TABLET BY MOUTH 1 HOUR BEFORE BEDTIME DAILY   30 tablet   8   . Vitamin D, Ergocalciferol, (DRISDOL) 50000 UNITS CAPS capsule   Oral   Take 50,000 Units by mouth every 7 (seven) days.           Allergies Cefaclor  Family History  Problem Relation Age of Onset  . Cancer Sister     breast    Social History Social History  Substance Use Topics  . Smoking status: Never Smoker   . Smokeless tobacco: None  . Alcohol Use: No    Review of Systems  Constitutional: Negative for fever. Eyes: Negative for visual changes. ENT: Negative for sore throat. Cardiovascular: Negative for chest pain. Respiratory: Mild nonproductive cough Gastrointestinal: Negative for abdominal pain, vomiting and diarrhea. Genitourinary: Negative for dysuria. Musculoskeletal: Negative for back pain. Skin: Negative for rash. Neurological: Negative for headache. 10 point Review of Systems otherwise negative ____________________________________________   PHYSICAL EXAM:  VITAL SIGNS: ED Triage Vitals  Enc Vitals Group     BP 07/01/15 1202 170/71 mmHg     Pulse Rate 07/01/15 1202 64     Resp 07/01/15 1202 19     Temp 07/01/15 1202 97.9 F (36.6 C)     Temp Source 07/01/15 1202 Oral     SpO2 07/01/15 1202 98 %     Weight --      Height --      Head Cir --      Peak Flow --      Pain Score 07/01/15 1203 5     Pain Loc --      Pain Edu? --  Excl. in GC? --      Constitutional: Alert and oriented. Well  appearing and in no distress. HEENT   Head: Normocephalic and atraumatic.      Eyes: Conjunctivae are normal. PERRL. Normal extraocular movements.      Ears:         Nose: No congestion/rhinnorhea.   Mouth/Throat: Mucous membranes are moist.   Neck: No stridor. Cardiovascular/Chest: Normal rate, regular rhythm.  No murmurs, rubs, or gallops. Respiratory: Normal respiratory effort without tachypnea nor retractions. Rhonchi with intermittent cough.   Gastrointestinal: Soft. No distention, no guarding, no rebound. Nontender.  Genitourinary/rectal:Deferred Musculoskeletal: Right hand skin tear between 2/3 and 3/4.  Contusion right hand. Neurologic:  Dementia.  No slurred speech.  No gross or focal neurologic deficits are appreciated. Skin:  Skin is warm, dry and intact. No rash noted. Psychiatric: Mood and affect are normal. Speech and behavior are normal. Patient exhibits appropriate insight and judgment.  ____________________________________________   EKG I, Governor Rooksebecca Valery Amedee, MD, the attending physician have personally viewed and interpreted all ECGs.  None ____________________________________________  LABS (pertinent positives/negatives)  None  ____________________________________________  RADIOLOGY All Xrays were viewed by me. Imaging interpreted by Radiologist.  Chest two-view: No acute findings. No evidence of pneumonia. Suspect chronic interstitial lung disease Right hand complete: Minimally displaced fracture of the fifth metacarpal head Degenerative changes noted as described CT head noncontrast: IMPRESSION: No acute intracranial findings.  Chronic ischemic microvascular disease. Atrophic change over the frontoparietal region which is stable. __________________________________________  PROCEDURES  Procedure(s) performed: LACERATION REPAIR Performed by: Governor RooksLORD, Jamerion Cabello Authorized by: Governor RooksLORD, Yesennia Hirota Consent: Verbal consent obtained. Risks and benefits: risks,  benefits and alternatives were discussed Consent given by: patient Patient identity confirmed: provided demographic data Prepped and Draped in normal sterile fashion Wound explored  Laceration Location: right hand  Laceration Length: 7cm total on 2 tears  No Foreign Bodies seen or palpated  Irrigation method: syringe Amount of cleaning: standard  Skin closure: steri strips  Patient tolerance: Patient tolerated the procedure well with no immediate complications.  Critical Care performed: None  ____________________________________________   ED COURSE / ASSESSMENT AND PLAN  Pertinent labs & imaging results that were available during my care of the patient were reviewed by me and considered in my medical decision making (see chart for details).    I confirmed with caregiver that this was not a medical syncope, and patient needs just a trauma evaluation.  Head CT is negative. No C-spine tenderness on exam. On exam only traumatic evidence is the right hand where there is skin tears, which I did repair with Steri-Strips and then placed her hand in a mitten wrap. She has a small fracture of the right fifth metacarpal and the patient was placed in a splint.    CONSULTATIONS:  None Patient / Family / Caregiver informed of clinical course, medical decision-making process, and agree with plan.   I discussed return precautions, follow-up instructions, and discharged instructions with patient and/or family.   ___________________________________________   FINAL CLINICAL IMPRESSION(S) / ED DIAGNOSES   Final diagnoses:  Skin tear of right hand without complication, initial encounter  Contusion of right hand, initial encounter  Closed fracture of fifth metacarpal bone of right hand, initial encounter              Note: This dictation was prepared with Dragon dictation. Any transcriptional errors that result from this process are unintentional   Governor Rooksebecca Charlene Detter,  MD 07/01/15 [phone]

## 2015-07-01 NOTE — ED Notes (Signed)
Pt D/C w/ son.

## 2015-07-01 NOTE — ED Notes (Signed)
Pt bib EMS w/ c/o mechanical witnessed fall.  Pt denies dizziness, head injury or LOC.  Per EMS, pt missed chair, tried to grab table which had screw sticking out and pt fell w/ skin tears to R hand.  Pt hx of dementia and falls.  NAD

## 2015-07-01 NOTE — Discharge Instructions (Signed)
Return to the emergency department for any numbness or tingling in the hand, any fever or trouble breathing, any altered mental status or other symptoms concerning to you.  Leave splint in place for 1 week, and have your doctor take a look and make sure the skin is healing before strips are removed on the hand.   Boxer's Fracture A boxer's fracture is a break (fracture) of the bone in your hand that connects your little finger to your wrist (fifth metacarpal). This type of fracture usually happens at the end of the bone, closest to the little finger. The knuckle is often pushed down by the impact. In some cases, only a splint or brace is needed, or you may need a cast. Casting or splinting may include taping your injured finger to the next finger (buddy taping). You may need surgery to repair the fracture. This may involve the use of wires, screws, or plates to hold the bone pieces in place.  CAUSES This injury may be caused by:   Hitting an object with a clenched fist.  A hard, direct hit to the hand.  An injury that crushes the hand. RISK FACTORS This injury is more likely to occur if:  You are in a fistfight.  You have certain bone diseases. SYMPTOMS Symptoms of this type of fracture develop soon after the injury. Symptoms may include:  Swelling of the hand.  Pain.  Pain when moving the fifth finger or touching the hand.  Abnormal position of the finger.  Not being able to move the finger.  A shortened finger.  A finger knuckle that looks sunken in. DIAGNOSIS This injury may be diagnosed based on your symptoms, especially if you had a recent hand injury. Your health care provider will perform a physical exam, and you may also have X-rays to confirm the diagnosis. TREATMENT  Treatment for this injury depends on how severe it is. Possible treatments include:  Closed reduction. If your bone is stable and can be moved back into place, you may only need to wear a cast or  splint or have buddy taping.  Open reduction with internal fixation (ORIF). This may be needed if your fracture is far out of place or goes through the joint surface of the bone. This treatment involves open surgery to move your bones back into the right position. Screws, wires, or plates may be used to stabilize the fracture. You may need to wear a cast or a splint for several weeks. You will also need to have follow-up X-rays to make sure that the bone is healing well and staying in position. After you no longer need the cast or splint, you may need physical therapy. This will help you to regain full movement and strength in your hand.  HOME CARE INSTRUCTIONS If You Have a Cast:  Do not stick anything inside the cast to scratch your skin. Doing that increases your risk of infection.  Check the skin around the cast every day. Report any concerns to your health care provider. You may put lotion on dry skin around the edges of the cast. Do not apply lotion to the skin underneath the cast. If You Have a Splint:  Wear it as directed by your health care provider. Remove it only as directed by your health care provider.  Loosen the splint if your fingers become numb and tingle, or if they turn cold and blue. Bathing  Cover the cast or splint with a watertight plastic bag to protect  it from water while you take a bath or a shower. Do not let the cast or splint get wet. Managing Pain, Stiffness, and Swelling  If directed, apply ice to the injured area (if you have a splint, not a cast):  Put ice in a plastic bag.  Place a towel between your skin and the bag.  Leave the ice on for 20 minutes, 2-3 times a day.  Move your fingers often to avoid stiffness and to lessen swelling.  Raise the injured area above the level of your heart while you are sitting or lying down. Driving  Do not drive or operate heavy machinery while taking pain medicine.  Do not drive while wearing a cast or splint on a  hand or foot that you use for driving. Activity  Return to your normal activities as directed by your health care provider. Ask your health care provider what activities are safe for you. General Instructions  Do not put pressure on any part of the cast or splint until it is fully hardened. This may take several hours.  Keep the cast or splint clean and dry.  Do not use any tobacco products, including cigarettes, chewing tobacco, or electronic cigarettes. Tobacco can delay bone healing. If you need help quitting, ask your health care provider.  Take medicines only as directed by your health care provider.  Keep all follow-up visits as directed by your health care provider. This is important. SEEK MEDICAL CARE IF:  Your pain is getting worse.  You have redness, swelling, or pain in the injured area.  You have fluid, blood, or pus coming from under your cast or splint.  You notice a bad smell coming from under your cast or splint.  You have a fever.  Your cast or splint feels too tight or too loose.  You cast is coming apart. SEEK IMMEDIATE MEDICAL CARE IF:  You develop a rash.  You have trouble breathing.  Your skin or nails on your injured hand turn blue or gray even after you loosen your splint.  Your injured hand feels cold or becomes numb even after you loosen your splint.  You develop severe pain under the cast or in your hand.   This information is not intended to replace advice given to you by your health care provider. Make sure you discuss any questions you have with your health care provider.   Document Released: 03/17/2005 Document Revised: 12/06/2014 Document Reviewed: 01/04/2014 Elsevier Interactive Patient Education 2016 [employer]   Skin Tear Care A skin tear is a wound in which the top layer of skin has peeled off. This is a common problem with aging because the skin becomes thinner and more fragile as a person gets older. In addition, some  medicines, such as oral corticosteroids, can lead to skin thinning if taken for long periods of time.  A skin tear is often repaired with tape or skin adhesive strips. This keeps the skin that has been peeled off in contact with the healthier skin beneath. Depending on the location of the wound, a bandage (dressing) may be applied over the tape or skin adhesive strips. Sometimes, during the healing process, the skin turns black and dies. Even when this happens, the torn skin acts as a good dressing until the skin underneath gets healthier and repairs itself. HOME CARE INSTRUCTIONS   Change dressings once per day or as directed by your caregiver.  Gently clean the skin tear and the area around the tear  using saline solution or mild soap and water.  Do not rub the injured skin dry. Let the area air dry.  Apply petroleum jelly or an antibiotic cream or ointment to keep the tear moist. This will help the wound heal. Do not allow a scab to form.  If the dressing sticks before the next dressing change, moisten it with warm soapy water and gently remove it.  Protect the injured skin until it has healed.  Only take over-the-counter or prescription medicines as directed by your caregiver.  Take showers or baths using warm soapy water. Apply a new dressing after the shower or bath.  Keep all follow-up appointments as directed by your caregiver.  SEEK IMMEDIATE MEDICAL CARE IF:   You have redness, swelling, or increasing pain in the skin tear.  You havepus coming from the skin tear.  You have chills.  You have a red streak that goes away from the skin tear.  You have a bad smell coming from the tear or dressing.  You have a fever or persistent symptoms for more than 2-3 days.  You have a fever and your symptoms suddenly get worse. MAKE SURE YOU:  Understand these instructions.  Will watch this condition.  Will get help right away if your child is not doing well or gets worse.     This information is not intended to replace advice given to you by your health care provider. Make sure you discuss any questions you have with your health care provider.   Document Released: 12/10/2000 Document Revised: 12/10/2011 Document Reviewed: 09/29/2011 Elsevier Interactive Patient Education [employer]

## 2015-07-01 NOTE — ED Notes (Signed)
Patient transported to X-ray 

## 2015-07-02 DIAGNOSIS — Z9181 History of falling: Secondary | ICD-10-CM | POA: Diagnosis not present

## 2015-07-02 DIAGNOSIS — M6281 Muscle weakness (generalized): Secondary | ICD-10-CM | POA: Diagnosis not present

## 2015-07-02 DIAGNOSIS — F419 Anxiety disorder, unspecified: Secondary | ICD-10-CM | POA: Diagnosis not present

## 2015-07-02 DIAGNOSIS — Z8781 Personal history of (healed) traumatic fracture: Secondary | ICD-10-CM | POA: Diagnosis not present

## 2015-07-02 DIAGNOSIS — F0391 Unspecified dementia with behavioral disturbance: Secondary | ICD-10-CM | POA: Diagnosis not present

## 2015-07-02 DIAGNOSIS — S0101XD Laceration without foreign body of scalp, subsequent encounter: Secondary | ICD-10-CM | POA: Diagnosis not present

## 2015-07-04 DIAGNOSIS — F0391 Unspecified dementia with behavioral disturbance: Secondary | ICD-10-CM | POA: Diagnosis not present

## 2015-07-04 DIAGNOSIS — F419 Anxiety disorder, unspecified: Secondary | ICD-10-CM | POA: Diagnosis not present

## 2015-07-04 DIAGNOSIS — S0101XD Laceration without foreign body of scalp, subsequent encounter: Secondary | ICD-10-CM | POA: Diagnosis not present

## 2015-07-04 DIAGNOSIS — Z8781 Personal history of (healed) traumatic fracture: Secondary | ICD-10-CM | POA: Diagnosis not present

## 2015-07-04 DIAGNOSIS — Z9181 History of falling: Secondary | ICD-10-CM | POA: Diagnosis not present

## 2015-07-04 DIAGNOSIS — M6281 Muscle weakness (generalized): Secondary | ICD-10-CM | POA: Diagnosis not present

## 2015-07-11 DIAGNOSIS — M6281 Muscle weakness (generalized): Secondary | ICD-10-CM | POA: Diagnosis not present

## 2015-07-11 DIAGNOSIS — S0101XD Laceration without foreign body of scalp, subsequent encounter: Secondary | ICD-10-CM | POA: Diagnosis not present

## 2015-07-11 DIAGNOSIS — F419 Anxiety disorder, unspecified: Secondary | ICD-10-CM | POA: Diagnosis not present

## 2015-07-11 DIAGNOSIS — Z9181 History of falling: Secondary | ICD-10-CM | POA: Diagnosis not present

## 2015-07-11 DIAGNOSIS — Z8781 Personal history of (healed) traumatic fracture: Secondary | ICD-10-CM | POA: Diagnosis not present

## 2015-07-11 DIAGNOSIS — F0391 Unspecified dementia with behavioral disturbance: Secondary | ICD-10-CM | POA: Diagnosis not present

## 2015-07-12 DIAGNOSIS — Z8781 Personal history of (healed) traumatic fracture: Secondary | ICD-10-CM | POA: Diagnosis not present

## 2015-07-12 DIAGNOSIS — F419 Anxiety disorder, unspecified: Secondary | ICD-10-CM | POA: Diagnosis not present

## 2015-07-12 DIAGNOSIS — F0391 Unspecified dementia with behavioral disturbance: Secondary | ICD-10-CM | POA: Diagnosis not present

## 2015-07-12 DIAGNOSIS — Z9181 History of falling: Secondary | ICD-10-CM | POA: Diagnosis not present

## 2015-07-12 DIAGNOSIS — S0101XD Laceration without foreign body of scalp, subsequent encounter: Secondary | ICD-10-CM | POA: Diagnosis not present

## 2015-07-12 DIAGNOSIS — M6281 Muscle weakness (generalized): Secondary | ICD-10-CM | POA: Diagnosis not present

## 2015-07-16 ENCOUNTER — Ambulatory Visit: Payer: Medicare Other | Admitting: Internal Medicine

## 2015-07-16 DIAGNOSIS — F0391 Unspecified dementia with behavioral disturbance: Secondary | ICD-10-CM | POA: Diagnosis not present

## 2015-07-16 DIAGNOSIS — M6281 Muscle weakness (generalized): Secondary | ICD-10-CM | POA: Diagnosis not present

## 2015-07-16 DIAGNOSIS — F419 Anxiety disorder, unspecified: Secondary | ICD-10-CM | POA: Diagnosis not present

## 2015-07-16 DIAGNOSIS — S0101XD Laceration without foreign body of scalp, subsequent encounter: Secondary | ICD-10-CM | POA: Diagnosis not present

## 2015-07-16 DIAGNOSIS — Z9181 History of falling: Secondary | ICD-10-CM | POA: Diagnosis not present

## 2015-07-16 DIAGNOSIS — Z8781 Personal history of (healed) traumatic fracture: Secondary | ICD-10-CM | POA: Diagnosis not present

## 2015-07-18 DIAGNOSIS — Z9181 History of falling: Secondary | ICD-10-CM | POA: Diagnosis not present

## 2015-07-18 DIAGNOSIS — F419 Anxiety disorder, unspecified: Secondary | ICD-10-CM | POA: Diagnosis not present

## 2015-07-18 DIAGNOSIS — Z8781 Personal history of (healed) traumatic fracture: Secondary | ICD-10-CM | POA: Diagnosis not present

## 2015-07-18 DIAGNOSIS — S0101XD Laceration without foreign body of scalp, subsequent encounter: Secondary | ICD-10-CM | POA: Diagnosis not present

## 2015-07-18 DIAGNOSIS — M6281 Muscle weakness (generalized): Secondary | ICD-10-CM | POA: Diagnosis not present

## 2015-07-18 DIAGNOSIS — F0391 Unspecified dementia with behavioral disturbance: Secondary | ICD-10-CM | POA: Diagnosis not present

## 2015-08-09 ENCOUNTER — Encounter: Payer: Self-pay | Admitting: Internal Medicine

## 2015-08-09 ENCOUNTER — Ambulatory Visit (INDEPENDENT_AMBULATORY_CARE_PROVIDER_SITE_OTHER): Payer: Medicare Other | Admitting: Internal Medicine

## 2015-08-09 ENCOUNTER — Ambulatory Visit
Admission: RE | Admit: 2015-08-09 | Discharge: 2015-08-09 | Disposition: A | Discharge status: Home / Self Care | Payer: Medicare Other | Source: Ambulatory Visit | Attending: Internal Medicine | Admitting: Internal Medicine

## 2015-08-09 VITALS — BP 104/52 | HR 84 | Temp 98.4°F | Resp 12 | Ht 64.0 in | Wt 147.5 lb

## 2015-08-09 DIAGNOSIS — S62306D Unspecified fracture of fifth metacarpal bone, right hand, subsequent encounter for fracture with routine healing: Secondary | ICD-10-CM | POA: Insufficient documentation

## 2015-08-09 DIAGNOSIS — R296 Repeated falls: Secondary | ICD-10-CM | POA: Diagnosis not present

## 2015-08-09 DIAGNOSIS — X58XXXD Exposure to other specified factors, subsequent encounter: Secondary | ICD-10-CM | POA: Insufficient documentation

## 2015-08-09 DIAGNOSIS — S62306A Unspecified fracture of fifth metacarpal bone, right hand, initial encounter for closed fracture: Secondary | ICD-10-CM

## 2015-08-09 DIAGNOSIS — S62316D Displaced fracture of base of fifth metacarpal bone, right hand, subsequent encounter for fracture with routine healing: Secondary | ICD-10-CM | POA: Diagnosis not present

## 2015-08-09 NOTE — Progress Notes (Signed)
Pre-visit discussion using our clinic review tool. No additional management support is needed unless otherwise documented below in the visit note.  

## 2015-08-09 NOTE — Progress Notes (Signed)
Subjective:  Patient ID: Catherine Meyers, female    DOB: Mar 05, 1928  Age: 82 y.o. MRN: [id number]  CC: The primary encounter diagnosis was Fracture of fifth metacarpal bone of right hand, closed, initial encounter. A diagnosis of Recurrent falls while walking was also pertinent to this visit.  HPI [name] presents for ER follow up .Patient has significant vascular dementia and cannot provide history.She is accompanied by caregivers.  Patient had a fall at the facility on April 2, when caregiver stepped away from the dining table.  Patient tried to stand up on her own and fell down.  Her  Right hand n was bruised and had a skin tear . x rays confirmed a 5th Metacarpal fracture, minimally displaced.  Right had was placed in a brace and the skin tears  On the hand were addressed with steri stips.  Home Health was called for dressing changes.  .  The bandage is dirty and not initialled,  Suggesting that it has not been changed very often, but the facility representative states that [facility] has been follow up with patient regularly.  Patient is walking since she received  PT therapy after her last hospitalization.  HOwever, she has  no sense of safety and has had a subsequent  fall in the shower since the ER visit bc she gets up suddenly if not closely spervised.   Losing weight since cutting out dessert . Previous weight was dubious and unlikely to be accurate     Outpatient Prescriptions Prior to Visit  Medication Sig Dispense Refill  . acetaminophen (MAPAP) 500 MG tablet Take 1 tablet (500 mg total) by mouth every 6 (six) hours as needed. 60 tablet 11  . alum & mag hydroxide-simeth (MAALOX/MYLANTA) 200-200-20 MG/5ML suspension Take 15 mLs by mouth every 6 (six) hours as needed for indigestion or heartburn.    . benzonatate (TESSALON) 200 MG capsule Take 1 capsule (200 mg total) by mouth 3 (three) times daily as needed for cough. 60 capsule 1  . bismuth subsalicylate (PEPTO BISMOL) 262 MG/15ML  suspension Take 30 mLs by mouth every 6 (six) hours as needed.    . Cranberry Fruit 405 MG CAPS TAKE 1 CAPSULE BY MOUTH TWICE DAILY FOR URINARY HEALTH 60 each PRN  . docusate sodium (COLACE) 100 MG capsule Take 1 capsule (100 mg total) by mouth 2 (two) times daily. 10 capsule 0  . guaiFENesin (ROBITUSSIN) 100 MG/5ML liquid Take 200 mg by mouth 3 (three) times daily as needed for cough.    . loperamide (IMODIUM) 2 MG capsule Take by mouth as needed for diarrhea or loose stools.    Marland Kitchen LORazepam (ATIVAN) 0.5 MG tablet Please give patient one tablet by mouth every evening at 8 pm .  DO NOT GIVE IF PATIENT IS LETHARGIC OR   ALREADY SLEEPING 30 tablet 5  . magnesium hydroxide (MILK OF MAGNESIA) 400 MG/5ML suspension Take 15 mLs by mouth daily as needed for mild constipation.    Marland Kitchen omeprazole (PRILOSEC) 40 MG capsule TAKE 1 CAPSULE BY MOUTH ONCE DAILY 90 capsule 1  . QUEtiapine (SEROQUEL) 50 MG tablet TAKE 1 TABLET BY MOUTH 1 HOUR BEFORE BEDTIME DAILY 30 tablet 8  . Vitamin D, Ergocalciferol, (DRISDOL) 50000 UNITS CAPS capsule Take 50,000 Units by mouth every 7 (seven) days.    Marland Kitchen enoxaparin (LOVENOX) 40 MG/0.4ML injection Inject 0.4 mLs (40 mg total) into the skin daily. (Patient not taking: Reported on 08/09/2015) 14 Syringe 0  . HYDROcodone-acetaminophen (NORCO/VICODIN)  5-325 MG tablet Take 1 tablet by mouth at bedtime. (Patient not taking: Reported on 08/09/2015) 30 tablet 0  . oxyCODONE (OXY IR/ROXICODONE) 5 MG immediate release tablet Take 1 tablet (5 mg total) by mouth every 4 (four) hours as needed for moderate pain. (Patient not taking: Reported on 08/09/2015) 30 tablet 0  . predniSONE (DELTASONE) 10 MG tablet 6 tablets on Day 1 , then reduce by 1 tablet daily until gone (Patient not taking: Reported on 08/09/2015) 21 tablet 0   No facility-administered medications prior to visit.    Review of Systems;  Patient denies headache, fevers, malaise, unintentional weight loss, skin rash, eye pain, sinus  congestion and sinus pain, sore throat, dysphagia,  hemoptysis , cough, dyspnea, wheezing, chest pain, palpitations, orthopnea, edema, abdominal pain, nausea, melena, diarrhea, constipation, flank pain, dysuria, hematuria, urinary  Frequency, nocturia, numbness, tingling, seizures,  Focal weakness, Loss of consciousness,  Tremor, insomnia, depression, anxiety, and suicidal ideation.      Objective:  BP 104/52 mmHg  Pulse 84  Temp(Src) 98.4 F (36.9 C) (Oral)  Resp 12  Ht 5\' 4"  (1.626 m)  Wt 147 lb 8 oz (66.906 kg)  BMI 25.31 kg/m2  SpO2 96%  BP Readings from Last 3 Encounters:  08/09/15 104/52  07/01/15 170/83  06/07/15 166/77    Wt Readings from Last 3 Encounters:  08/09/15 147 lb 8 oz (66.906 kg)  06/07/15 168 lb (76.204 kg)  05/10/15 167 lb (75.751 kg)    General appearance: alert, cooperative and appears stated age Ears: normal TM's and external ear canals both ears Throat: lips, mucosa, and tongue normal; teeth and gums normal Neck: no adenopathy, no carotid bruit, supple, symmetrical, trachea midline and thyroid not enlarged, symmetric, no tenderness/mass/nodules Back: symmetric, no curvature. ROM normal. No CVA tenderness. Lungs: clear to auscultation bilaterally Heart: regular rate and rhythm, S1, S2 normal, no murmur, click, rub or gallop Abdomen: soft, non-tender; bowel sounds normal; no masses,  no organomegaly Pulses: 2+ and symmetric Skin: Skin color, texture, turgor normal. No rashes or lesions Lymph nodes: Cervical, supraclavicular, and axillary nodes normal.  No results found for: HGBA1C  Lab Results  Component Value Date   CREATININE 0.97 03/20/2015   CREATININE 0.94 03/19/2015   CREATININE 1.14* 03/18/2015    Lab Results  Component Value Date   WBC 8.7 03/20/2015   HGB 13.2 03/20/2015   HCT 39.5 03/20/2015   PLT 184 03/20/2015   GLUCOSE 144* 03/20/2015   CHOL 176 05/04/2012   TRIG 95.0 05/04/2012   HDL 55.70 05/04/2012   LDLCALC 101*  05/04/2012   ALT 24 01-20-2014   AST 31 01-20-2014   NA 138 03/20/2015   K 4.0 03/20/2015   CL 107 03/20/2015   CREATININE 0.97 03/20/2015   BUN 13 03/20/2015   CO2 27 03/20/2015   TSH 3.17 05/31/2013    Dg Chest 2 View  07/01/2015  CLINICAL DATA:  Unwitnessed fall today EXAM: CHEST  2 VIEW COMPARISON:  Chest x-ray dated 06/07/2015. FINDINGS: Heart size is upper normal, stable. Overall cardiomediastinal silhouette is stable in size and configuration. Atherosclerotic changes again noted at the aortic arch. Coarse interstitial lung markings again noted, suggesting chronic interstitial lung disease. No evidence of pneumonia. No pleural effusion or pneumothorax seen. Mild degenerative spurring noted within the kyphotic thoracic spine. No evidence of acute osseous abnormality. IMPRESSION: No acute findings. No evidence of pneumonia. Suspect chronic interstitial lung disease. Electronically Signed   By: Bary RichardStan  Maynard M.D.   On:  07/01/2015 15:10   Ct Head Wo Contrast  07/01/2015  CLINICAL DATA:  Fall. EXAM: CT HEAD WITHOUT CONTRAST TECHNIQUE: Contiguous axial images were obtained from the base of the skull through the vertex without intravenous contrast. COMPARISON:  06/07/2015 and 09/14/2014 FINDINGS: Ventricles and cisterns are within normal. Prominence of the frontoparietal CSF spaces unchanged likely atrophy. There is chronic ischemic microvascular disease. There is no mass, mass effect, shift of midline structures or acute hemorrhage. No evidence of acute infarction. Bones and soft tissues are within normal. IMPRESSION: No acute intracranial findings. Chronic ischemic microvascular disease. Atrophic change over the frontoparietal region which is stable. Electronically Signed   By: Elberta Fortis M.D.   On: 07/01/2015 14:08   Dg Hand Complete Right  07/01/2015  CLINICAL DATA:  Fall today with right hand injury. EXAM: RIGHT HAND - COMPLETE 3+ VIEW COMPARISON:  None. FINDINGS: Mild degenerative change of  the radiocarpal joint and carpal bones. Mild degenerate change over the interphalangeal joints. There is a minimally displaced fracture of the head of the fifth metacarpal with slight radial and volar angulation of the distal fragment. Remainder of the exam is unremarkable. IMPRESSION: Minimally displaced fracture of the head of the fifth metacarpal. Degenerative changes as described. Electronically Signed   By: Elberta Fortis M.D.   On: 07/01/2015 15:10    Assessment & Plan:   Problem List Items Addressed This Visit    Recurrent falls while walking    She continues to fall ,  And suffered a 5th MC fracture on April 2,  But is ambulating again.  She is supposed to have one on one supervision since she cannot be strapped to the wheelchair      Fracture of fifth metacarpal bone of right hand - Primary    Occurred April 2,  Has been wearing a removable cast.  X rays ordered today show that the fracture is healing but slight angulation is again noted since patient was not splinted after the injury,  Continue use of brace for 4 more weeks.        Relevant Orders   DG Hand Complete Right (Completed)     A total of 25 minutes of face to face time was spent with patient more than half of which was spent in reviewing the ER images, counselling staff about the above mentioned conditions  and coordination of care   I am having Ms. Lagerquist maintain her QUEtiapine, guaiFENesin, bismuth subsalicylate, alum & mag hydroxide-simeth, loperamide, magnesium hydroxide, Cranberry Fruit, omeprazole, Vitamin D (Ergocalciferol), enoxaparin, oxyCODONE, docusate sodium, HYDROcodone-acetaminophen, acetaminophen, LORazepam, benzonatate, and predniSONE.  No orders of the defined types were placed in this encounter.    There are no discontinued medications.  Follow-up: No Follow-up on file.   Sherlene Shams, MD

## 2015-08-09 NOTE — Patient Instructions (Signed)
We are repeating the x rays of Catherine Meyers's pinkie finger today.  She will need to continue wearing the mold  Until the fracture has healed.  Keep the elbow laceration covered with a non stick Telfa pad until it has healed.

## 2015-08-11 ENCOUNTER — Encounter: Payer: Self-pay | Admitting: Internal Medicine

## 2015-08-11 NOTE — Assessment & Plan Note (Signed)
She continues to fall ,  And suffered a 5th MC fracture on April 2,  But is ambulating again.  She is supposed to have one on one supervision since she cannot be strapped to the wheelchair

## 2015-08-11 NOTE — Assessment & Plan Note (Addendum)
Occurred April 2,  Has been wearing a removable cast.  X rays ordered today show that the fracture is healing but slight angulation is again noted since patient was not splinted after the injury,  Continue use of brace for 4 more weeks.

## 2015-08-13 ENCOUNTER — Telehealth: Payer: Self-pay | Admitting: *Deleted

## 2015-08-13 NOTE — Telephone Encounter (Signed)
Spoke with Catherine Meyers at [facility], got Fax number, faxed results as requested.

## 2015-08-13 NOTE — Telephone Encounter (Signed)
Monica MartinezKita from [facility] has requested to have patients Xray results of hand [phone]

## 2015-08-15 ENCOUNTER — Telehealth: Payer: Self-pay | Admitting: Internal Medicine

## 2015-08-15 NOTE — Telephone Encounter (Signed)
Spoke to BadenKita. Advised per Dr. Melina Schoolsullo's 08/09/15 OV note patient should continue use of brace for 4 more weeks. Advised would send message to Dr. Melina Schoolsullo's nurse in reference to if patient should follow up with Ortho after that or not, advised not showing instructions for patient to, but would verify. Monica MartinezKita verbalized understanding.

## 2015-08-15 NOTE — Telephone Encounter (Signed)
Spoke with Monica MartinezKita aat [facility] and advised as directed by MD.

## 2015-08-15 NOTE — Telephone Encounter (Signed)
Does patient need an Ortho follow up after wearing Brace for the 4 weeks specified?

## 2015-08-15 NOTE — Telephone Encounter (Signed)
No, not necessary to see ortho unless family wants her to.

## 2015-08-15 NOTE — Telephone Encounter (Signed)
Catherine Meyers [phone] called from [facility] regarding wanting to know how much longer she needs to wear the brace on her hand? And does she need to follow up with the Ortho? Written order fax to [phone]. Thank you!

## 2015-09-12 ENCOUNTER — Ambulatory Visit (INDEPENDENT_AMBULATORY_CARE_PROVIDER_SITE_OTHER): Payer: Medicare Other

## 2015-09-12 VITALS — BP 118/68 | HR 69 | Temp 97.1°F | Resp 14 | Ht 64.0 in | Wt 144.1 lb

## 2015-09-12 DIAGNOSIS — Z23 Encounter for immunization: Secondary | ICD-10-CM

## 2015-09-12 DIAGNOSIS — Z0000 Encounter for general adult medical examination without abnormal findings: Secondary | ICD-10-CM | POA: Diagnosis not present

## 2015-09-12 NOTE — Patient Instructions (Signed)
  Ms. Catherine Meyers , Thank you for taking time to come for your Medicare Wellness Visit. I appreciate your ongoing commitment to your health goals. Please review the following plan we discussed and let me know if I can assist you in the future.   Follow up with [name] as needed.   This is a list of the screening recommended for you and due dates:  Health Maintenance  Topic Date Due  . Tetanus Vaccine  [birth year]/1949  . Shingles Vaccine  01/15/1990  . DEXA scan (bone density measurement)  01/16/1995  . Pneumonia vaccines (2 of 2 - PPSV23) 11/02/2014  . Flu Shot  10/30/2015

## 2015-09-12 NOTE — Progress Notes (Signed)
  I have reviewed the above information and agree with above.   Claudis Giovanelli, MD 

## 2015-09-12 NOTE — Progress Notes (Signed)
Subjective:   Catherine Meyers is a 82 y.o. female who presents for Medicare Annual (Subsequent) preventive examination.  Review of Systems:  No ROS.  Medicare Wellness Visit.  Cardiac Risk Factors include: advanced age (>64men, >1 women)     Objective:     Vitals: BP 118/68 mmHg  Pulse 69  Temp(Src) 97.1 F (36.2 C) (Oral)  Resp 14  Ht  (1.626 m)  Wt 144 lb 1.9 oz (65.372 kg)  BMI 24.73 kg/m2  SpO2 96%  Body mass index is 24.73 kg/(m^2).   Tobacco History  Smoking status  . Never Smoker   Smokeless tobacco  . Not on file     Counseling given: Not Answered   Past Medical History  Diagnosis Date  . Migraine   . Hepatitis   . Hepatitis, water-borne 1965  . Arthritis     left knee  . Dementia   . Vitamin D deficiency disease   . Osteopenia   . Dementia    Past Surgical History  Procedure Laterality Date  . Abdominal hysterectomy  1986  . Knee arthroscopy  2003  . Hip pinning,cannulated Right 03/18/2015    Procedure: CANNULATED HIP PINNING;  Surgeon: Kennedy Bucker, MD;  Location: [facility] ORS;  Service: Orthopedics;  Laterality: Right;   Family History  Problem Relation Age of Onset  . Cancer Sister     breast   History  Sexual Activity  . Sexual Activity: No    Outpatient Encounter Prescriptions as of 09/12/2015  Medication Sig  . acetaminophen (MAPAP) 500 MG tablet Take 1 tablet (500 mg total) by mouth every 6 (six) hours as needed.  Marland Kitchen alum & mag hydroxide-simeth (MAALOX/MYLANTA) 200-200-20 MG/5ML suspension Take 15 mLs by mouth every 6 (six) hours as needed for indigestion or heartburn.  . benzonatate (TESSALON) 200 MG capsule Take 1 capsule (200 mg total) by mouth 3 (three) times daily as needed for cough.  . bismuth subsalicylate (PEPTO BISMOL) 262 MG/15ML suspension Take 30 mLs by mouth every 6 (six) hours as needed.  . Cranberry Fruit 405 MG CAPS TAKE 1 CAPSULE BY MOUTH TWICE DAILY FOR URINARY HEALTH  . docusate sodium (COLACE) 100 MG capsule  Take 1 capsule (100 mg total) by mouth 2 (two) times daily.  Marland Kitchen enoxaparin (LOVENOX) 40 MG/0.4ML injection Inject 0.4 mLs (40 mg total) into the skin daily.  Marland Kitchen guaiFENesin (ROBITUSSIN) 100 MG/5ML liquid Take 200 mg by mouth 3 (three) times daily as needed for cough.  Marland Kitchen HYDROcodone-acetaminophen (NORCO/VICODIN) 5-325 MG tablet Take 1 tablet by mouth at bedtime.  Marland Kitchen loperamide (IMODIUM) 2 MG capsule Take by mouth as needed for diarrhea or loose stools.  Marland Kitchen LORazepam (ATIVAN) 0.5 MG tablet Please give patient one tablet by mouth every evening at 8 pm .  DO NOT GIVE IF PATIENT IS LETHARGIC OR   ALREADY SLEEPING  . magnesium hydroxide (MILK OF MAGNESIA) 400 MG/5ML suspension Take 15 mLs by mouth daily as needed for mild constipation.  Marland Kitchen omeprazole (PRILOSEC) 40 MG capsule TAKE 1 CAPSULE BY MOUTH ONCE DAILY  . oxyCODONE (OXY IR/ROXICODONE) 5 MG immediate release tablet Take 1 tablet (5 mg total) by mouth every 4 (four) hours as needed for moderate pain.  . predniSONE (DELTASONE) 10 MG tablet 6 tablets on Day 1 , then reduce by 1 tablet daily until gone  . QUEtiapine (SEROQUEL) 50 MG tablet TAKE 1 TABLET BY MOUTH 1 HOUR BEFORE BEDTIME DAILY  . Vitamin D, Ergocalciferol, (DRISDOL) 50000 UNITS CAPS  capsule Take 50,000 Units by mouth every 7 (seven) days.   No facility-administered encounter medications on file as of 09/12/2015.    Activities of Daily Living In your present state of health, do you have any difficulty performing the following activities: 09/12/2015 03/19/2015  Hearing? Y N  Vision? N N  Difficulty concentrating or making decisions? Malvin JohnsY Y  Walking or climbing stairs? Y Y  Dressing or bathing? Y Y  Doing errands, shopping? Y -  Quarry managerreparing Food and eating ? Y -  Using the Toilet? Y -  In the past six months, have you accidently leaked urine? N -  Do you have problems with loss of bowel control? N -  Managing your Medications? Y -  Managing your Finances? Y -  Housekeeping or managing your  Housekeeping? Y -    Patient Care Team: Sherlene Shamseresa L Tullo, MD as PCP - General (Internal Medicine)    Assessment:   This is a routine wellness examination [facility]. The goal of the wellness visit is to assist the patient how to close the gaps in care and create a preventative care plan for the patient.   Taking calcium VIT D as appropriate/Osteoporosis risk reviewed.  DEXA SCAN deferred per patient preference.  Follow up with PCP.  Medications reviewed; taking without issues or barriers.  Safety issues reviewed; smoke and carbon monoxide detectors in the home. No firearms in the home. Wears seatbelts when driving or riding with others. No violence in the home.  No new risk were noted; The patient was oriented x 1; appropriate in dress.  Not independent in all ADL's due to dementia.   Acute kidney failure-stable and followed by PCP. Fx of neck and femur-stable and followed by PCP.  Pneumococcal 23 vaccine administered, L deltoid.  Tolerated well.    TDAP/ZOSTER vaccine deferred per patient preference.  Follow up with insurance and PCP.  Patient Concerns: Medication list review form; indicate use/purpose of medications. Reviewed medications and documented use/purpose.  Given to caretaker Sherre ScarletKimberly Williams, [facility].  Follow up with PCP as needed.  Exercise Activities and Dietary recommendations Current Exercise Habits: Home exercise routine;Structured exercise class (Chair exercises dailly with staff), Time (Minutes): 30, Frequency (Times/Week): 6, Weekly Exercise (Minutes/Week): 180, Intensity: Mild  Goals    . Healhty Lifestyle     STAY HYDRATED AND DRINK PLENTY OF FLUIDS. VEGETABLES AND FRUITS. STAY ACTIVE AND CONTINUE CHAIR EXERCISING WITH STAFF.      Fall Risk Fall Risk  09/12/2015 05/31/2013 05/04/2012  Falls in the past year? Yes No -  Number falls in past yr: 2 or more - -  Injury with Fall? Yes - -  Risk Factor Category  High Fall Risk - -  Risk for fall due to  : Impaired mobility - Impaired mobility;Orthopedic patient  Follow up Falls prevention discussed;Education provided - -   Depression Screen PHQ 2/9 Scores 09/12/2015 05/31/2013 05/04/2012  PHQ - 2 Score 0 0 2  PHQ- 9 Score - - 10     Cognitive Testing MMSE - Mini Mental State Exam 09/12/2015  Not completed: Unable to complete    Immunization History  Administered Date(s) Administered  . Influenza-Unspecified 01/05/2014  . PPD Test 11/08/2013  . Pneumococcal Conjugate-13 11/01/2013  . Pneumococcal Polysaccharide-23 09/12/2015   Screening Tests Health Maintenance  Topic Date Due  . TETANUS/TDAP  01/15/1949  . ZOSTAVAX  01/15/1990  . DEXA SCAN  01/16/1995  . INFLUENZA VACCINE  10/30/2015  . PNA vac Low  Risk Adult  Completed      Plan:   End of life planning; Advance aging; Advanced directives discussed. Copy of current HCPOA/Living Will requested.   During the course of the visit the patient was educated and counseled about the following appropriate screening and preventive services:   Vaccines to include Pneumoccal, Influenza, Hepatitis B, Td, Zostavax, HCV  Electrocardiogram  Cardiovascular Disease  Colorectal cancer screening  Bone density screening  Diabetes screening  Glaucoma screening  Mammography/PAP  Nutrition counseling   Patient Instructions (the written plan) was given to the patient.   Ashok Pall, LPN  1/61/0960

## 2015-09-17 ENCOUNTER — Telehealth: Payer: Self-pay | Admitting: Internal Medicine

## 2015-09-17 NOTE — Telephone Encounter (Signed)
Inetta Fermoina from [facility] called asking if the patient will need further x-rays on right hand and if they can take the patient's brace off.

## 2015-09-18 NOTE — Telephone Encounter (Signed)
Spoke to [place]Karen at [facility].  She was notified of what [name] stated and letter has been faxed.

## 2015-09-18 NOTE — Telephone Encounter (Signed)
No additional x rays needed of the fractured 5th metacarpal.  Staff can remove the brace.  Regards,   Duncan Dulleresa Kaydra Borgen, MD

## 2015-09-18 NOTE — Telephone Encounter (Signed)
Please advise 

## 2015-10-09 ENCOUNTER — Other Ambulatory Visit: Payer: Self-pay | Admitting: Internal Medicine

## 2015-10-31 ENCOUNTER — Other Ambulatory Visit: Payer: Self-pay | Admitting: Internal Medicine

## 2015-10-31 ENCOUNTER — Encounter: Payer: Self-pay | Admitting: *Deleted

## 2015-12-03 ENCOUNTER — Other Ambulatory Visit: Payer: Self-pay | Admitting: Internal Medicine

## 2015-12-22 ENCOUNTER — Other Ambulatory Visit: Payer: Self-pay | Admitting: Internal Medicine

## 2015-12-27 ENCOUNTER — Telehealth: Payer: Self-pay | Admitting: Internal Medicine

## 2015-12-27 NOTE — Telephone Encounter (Signed)
Pt caregiver came in and dropped off a plan of care form to be filled out.. Placed in Dr.Tullos folder up front.Catherine Meyers. Karen (caregiver) states that they need this back asap..please advise [phone] when complete.

## 2015-12-27 NOTE — Telephone Encounter (Signed)
PCP will not be able to sign until next week facility aware.

## 2016-01-21 ENCOUNTER — Other Ambulatory Visit: Payer: Self-pay | Admitting: Internal Medicine

## 2016-01-21 ENCOUNTER — Ambulatory Visit (INDEPENDENT_AMBULATORY_CARE_PROVIDER_SITE_OTHER): Payer: Medicare Other | Admitting: Internal Medicine

## 2016-01-21 VITALS — BP 118/70 | HR 88 | Temp 97.7°F | Resp 12 | Ht 64.0 in | Wt 147.5 lb

## 2016-01-21 DIAGNOSIS — F0151 Vascular dementia with behavioral disturbance: Secondary | ICD-10-CM

## 2016-01-21 DIAGNOSIS — R5383 Other fatigue: Secondary | ICD-10-CM | POA: Diagnosis not present

## 2016-01-21 DIAGNOSIS — F0153 Vascular dementia, unspecified severity, with mood disturbance: Secondary | ICD-10-CM

## 2016-01-21 DIAGNOSIS — M1651 Unilateral post-traumatic osteoarthritis, right hip: Secondary | ICD-10-CM | POA: Diagnosis not present

## 2016-01-21 DIAGNOSIS — N179 Acute kidney failure, unspecified: Secondary | ICD-10-CM

## 2016-01-21 DIAGNOSIS — E559 Vitamin D deficiency, unspecified: Secondary | ICD-10-CM | POA: Diagnosis not present

## 2016-01-21 DIAGNOSIS — R296 Repeated falls: Secondary | ICD-10-CM | POA: Diagnosis not present

## 2016-01-21 DIAGNOSIS — F329 Major depressive disorder, single episode, unspecified: Secondary | ICD-10-CM

## 2016-01-21 MED ORDER — TETANUS-DIPHTH-ACELL PERTUSSIS 5-2.5-18.5 LF-MCG/0.5 IM SUSP: 0.5000 mL | Freq: Once | INTRAMUSCULAR | 0 refills | Status: AC

## 2016-01-21 NOTE — Progress Notes (Signed)
Subjective:  Patient ID: Catherine Meyers, female    DOB: 12/17/1927  Age: 82 y.o. MRN: [id number]  CC: The primary encounter diagnosis was Fatigue, unspecified type. Diagnoses of Vitamin D deficiency, Post-traumatic osteoarthritis of right hip, Recurrent falls while walking, Vascular dementia with depressed mood, and Acute renal failure, unspecified acute renal failure type [facility](HCC) were also pertinent to this visit.  HPI Catherine RoesFay R Rautio presents for follow up on chronic condtions. She was last seen in May after sustaining a 5th MT fracture during a fall at the facility.  She is accompanied by a non family member today from the facility.  She is pleasant and responsive  To simple questions.  Ambulating with a walker,  Minimal assistance needed except with rising and sitting.  There have been no recent falls reported.  Appetite excellent.   Outpatient Medications Prior to Visit  Medication Sig Dispense Refill  . acetaminophen (MAPAP) 500 MG tablet Take 1 tablet (500 mg total) by mouth every 6 (six) hours as needed. 60 tablet 11  . alum & mag hydroxide-simeth (MAALOX/MYLANTA) 200-200-20 MG/5ML suspension Take 15 mLs by mouth every 6 (six) hours as needed for indigestion or heartburn.    . benzonatate (TESSALON) 200 MG capsule Take 1 capsule (200 mg total) by mouth 3 (three) times daily as needed for cough. 60 capsule 1  . bismuth subsalicylate (PEPTO BISMOL) 262 MG/15ML suspension Take 30 mLs by mouth every 6 (six) hours as needed.    . Cranberry Fruit 405 MG CAPS TAKE 1 CAPSULE BY MOUTH TWICE DAILY FOR URINARY HEALTH 60 each PRN  . docusate sodium (COLACE) 100 MG capsule Take 1 capsule (100 mg total) by mouth 2 (two) times daily. 10 capsule 0  . guaiFENesin (ROBITUSSIN) 100 MG/5ML liquid Take 200 mg by mouth 3 (three) times daily as needed for cough.    . loperamide (IMODIUM) 2 MG capsule Take by mouth as needed for diarrhea or loose stools.    Marland Kitchen. LORazepam (ATIVAN) 0.5 MG tablet Please give patient one  tablet by mouth every evening at 8 pm .  DO NOT GIVE IF PATIENT IS LETHARGIC OR   ALREADY SLEEPING 30 tablet 5  . magnesium hydroxide (MILK OF MAGNESIA) 400 MG/5ML suspension Take 15 mLs by mouth daily as needed for mild constipation.    Marland Kitchen. omeprazole (PRILOSEC) 40 MG capsule TAKE 1 CAPSULE BY MOUTH ONCE DAILY 90 capsule 1  . QUEtiapine (SEROQUEL) 50 MG tablet TAKE 1 TABLET BY MOUTH ONCE DAILY AT BEDTIME FOR PSYCHOSIS 30 tablet 3  . Vitamin D, Ergocalciferol, (DRISDOL) 50000 UNITS CAPS capsule Take 50,000 Units by mouth every 7 (seven) days.    Marland Kitchen. enoxaparin (LOVENOX) 40 MG/0.4ML injection Inject 0.4 mLs (40 mg total) into the skin daily. (Patient not taking: Reported on 01/21/2016) 14 Syringe 0  . HYDROcodone-acetaminophen (NORCO/VICODIN) 5-325 MG tablet Take 1 tablet by mouth at bedtime. (Patient not taking: Reported on 01/21/2016) 30 tablet 0  . oxyCODONE (OXY IR/ROXICODONE) 5 MG immediate release tablet Take 1 tablet (5 mg total) by mouth every 4 (four) hours as needed for moderate pain. (Patient not taking: Reported on 01/21/2016) 30 tablet 0  . predniSONE (DELTASONE) 10 MG tablet 6 tablets on Day 1 , then reduce by 1 tablet daily until gone (Patient not taking: Reported on 01/21/2016) 21 tablet 0   No facility-administered medications prior to visit.     Review of Systems;  Patient denies headache, fevers, malaise, unintentional weight loss, skin rash, eye pain,  sinus congestion and sinus pain, sore throat, dysphagia,  hemoptysis , cough, dyspnea, wheezing, chest pain, palpitations, orthopnea, edema, abdominal pain, nausea, melena, diarrhea, constipation, flank pain, dysuria, hematuria, urinary  Frequency, nocturia, numbness, tingling, seizures,  Focal weakness, Loss of consciousness,  Tremor, insomnia, depression, anxiety, and suicidal ideation.      Objective:  BP 118/70   Pulse 88   Temp 97.7 F (36.5 C) (Oral)   Resp 12   Ht 5\' 4"  (1.626 m)   Wt 147 lb 8 oz (66.9 kg)   SpO2 98%    BMI 25.32 kg/m   BP Readings from Last 3 Encounters:  01/21/16 118/70  09/12/15 118/68  08/09/15 (!) 104/52    Wt Readings from Last 3 Encounters:  01/21/16 147 lb 8 oz (66.9 kg)  09/12/15 144 lb 1.9 oz (65.4 kg)  08/09/15 147 lb 8 oz (66.9 kg)    General appearance: alert, cooperative and appears stated age Ears: normal TM's and external ear canals both ears Throat: lips, mucosa, and tongue normal; teeth and gums normal Neck: no adenopathy, no carotid bruit, supple, symmetrical, trachea midline and thyroid not enlarged, symmetric, no tenderness/mass/nodules Back: symmetric, no curvature. ROM normal. No CVA tenderness. Lungs: clear to auscultation bilaterally Heart: regular rate and rhythm, S1, S2 normal, no murmur, click, rub or gallop Abdomen: soft, non-tender; bowel sounds normal; no masses,  no organomegaly Pulses: 2+ and symmetric Skin: Skin color, texture, turgor normal. No rashes or lesions Lymph nodes: Cervical, supraclavicular, and axillary nodes normal.  No results found for: HGBA1C  Lab Results  Component Value Date   CREATININE 0.97 03/20/2015   CREATININE 0.94 03/19/2015   CREATININE 1.14 (H) 03/18/2015    Lab Results  Component Value Date   WBC 8.7 03/20/2015   HGB 13.2 03/20/2015   HCT 39.5 03/20/2015   PLT 184 03/20/2015   GLUCOSE 144 (H) 03/20/2015   CHOL 176 05/04/2012   TRIG 95.0 05/04/2012   HDL 55.70 05/04/2012   LDLCALC 101 (H) 05/04/2012   ALT 24 05/09/202015   AST 31 05/09/202015   NA 138 03/20/2015   K 4.0 03/20/2015   CL 107 03/20/2015   CREATININE 0.97 03/20/2015   BUN 13 03/20/2015   CO2 27 03/20/2015   TSH 3.17 05/31/2013    Dg Hand Complete Right  Result Date: 08/09/2015 CLINICAL DATA:  Fall 6 weeks ago with fifth metacarpal fracture, subsequent encounter EXAM: RIGHT HAND - COMPLETE 3+ VIEW COMPARISON:  07/01/15 FINDINGS: Angulated distal fifth metacarpal fracture is again noted with some mild callus formation. No new focal  abnormality is seen. IMPRESSION: Healing fifth metacarpal fracture Electronically Signed   By: Alcide Clever M.D.   On: 08/09/2015 13:47    Assessment & Plan:   Problem List Items Addressed This Visit    Vascular dementia with depressed mood    Vascular,  by CT/MRI.  She has developed bowel  and bladder incontinence.  Nighttime agitation controlled with seroquel and lorazepam.  DNR status reviewed ;  son  has POA      Acute renal failure (HCC)    Resolved,  Repeat assessment today is due  Lab Results  Component Value Date   CREATININE 0.97 03/20/2015   Lab Results  Component Value Date   NA 138 03/20/2015   K 4.0 03/20/2015   CL 107 03/20/2015   CO2 27 03/20/2015         Recurrent falls while walking    Using a walker ,  Has  supervision and no recent falls.      Osteoarthritis of right hip    Secondary to history of femoral fracture.  Scheduling tylenol 650 mg q 12 hours.       Vitamin D deficiency   Relevant Orders   VITAMIN D 25 Hydroxy (Vit-D Deficiency, Fractures)    Other Visit Diagnoses    Fatigue, unspecified type    -  Primary   Relevant Orders   Comprehensive metabolic panel   TSH      I have discontinued Catherine Meyers's enoxaparin, oxyCODONE, HYDROcodone-acetaminophen, and predniSONE. I am also having her start on Tdap. Additionally, I am having her maintain her guaiFENesin, bismuth subsalicylate, alum & mag hydroxide-simeth, loperamide, magnesium hydroxide, Cranberry Fruit, Vitamin D (Ergocalciferol), docusate sodium, acetaminophen, LORazepam, benzonatate, omeprazole, and QUEtiapine.  Meds ordered this encounter  Medications  . Tdap (BOOSTRIX) 5-2.5-18.5 LF-MCG/0.5 injection    Sig: Inject 0.5 mLs into the muscle once.    Dispense:  0.5 mL    Refill:  0    Medications Discontinued During This Encounter  Medication Reason  . enoxaparin (LOVENOX) 40 MG/0.4ML injection Completed Course  . HYDROcodone-acetaminophen (NORCO/VICODIN) 5-325 MG tablet  Completed Course  . oxyCODONE (OXY IR/ROXICODONE) 5 MG immediate release tablet Completed Course  . predniSONE (DELTASONE) 10 MG tablet Completed Course    Follow-up: Return in about 6 months (around 07/21/2016).   Sherlene Shams, MD

## 2016-01-21 NOTE — Patient Instructions (Addendum)
Catherine Meyers appears well cared for,  But she appears to be in pain from arthritis  I am scheduling tylenol 500 mg to be give every 12 hours (twice daily)  I recommend that she have the TDaP vaccine if not done in the last 10 years    I will see her again in 6 months

## 2016-01-21 NOTE — Progress Notes (Signed)
Pre-visit discussion using our clinic review tool. No additional management support is needed unless otherwise documented below in the visit note.  

## 2016-01-22 ENCOUNTER — Encounter: Payer: Self-pay | Admitting: Internal Medicine

## 2016-01-22 ENCOUNTER — Other Ambulatory Visit: Payer: Self-pay | Admitting: Internal Medicine

## 2016-01-22 ENCOUNTER — Telehealth: Payer: Self-pay | Admitting: Internal Medicine

## 2016-01-22 DIAGNOSIS — R7301 Impaired fasting glucose: Secondary | ICD-10-CM

## 2016-01-22 LAB — COMPREHENSIVE METABOLIC PANEL
ALT: 24 U/L (ref 0–35)
AST: 25 U/L (ref 0–37)
Albumin: 3.9 g/dL (ref 3.5–5.2)
Alkaline Phosphatase: 94 U/L (ref 39–117)
BUN: 19 mg/dL (ref 6–23)
CHLORIDE: 104 meq/L (ref 96–112)
CO2: 25 meq/L (ref 19–32)
Calcium: 9.2 mg/dL (ref 8.4–10.5)
Creatinine, Ser: 0.99 mg/dL (ref 0.40–1.20)
GFR: 56.52 mL/min — ABNORMAL LOW (ref >60.00)
GLUCOSE: 220 mg/dL — AB (ref 70–99)
POTASSIUM: 3.8 meq/L (ref 3.5–5.1)
SODIUM: 138 meq/L (ref 135–145)
Total Bilirubin: 0.3 mg/dL (ref 0.2–1.2)
Total Protein: 6.7 g/dL (ref 6.0–8.3)

## 2016-01-22 LAB — VITAMIN D 25 HYDROXY (VIT D DEFICIENCY, FRACTURES): VITD: 61.87 ng/mL (ref 30.00–100.00)

## 2016-01-22 LAB — TSH: TSH: 3.76 u[IU]/mL (ref 0.35–4.50)

## 2016-01-22 NOTE — Telephone Encounter (Signed)
Please advise when labs return if refill is advised.

## 2016-01-22 NOTE — Assessment & Plan Note (Signed)
Secondary to history of femoral fracture.  Scheduling tylenol 650 mg q 12 hours.

## 2016-01-22 NOTE — Telephone Encounter (Signed)
Please draft a letter saying this  D/c tylenol 500 mg every 6 hours prn.

## 2016-01-22 NOTE — Telephone Encounter (Signed)
Facility requesting D/C order for Tylenol since patient receiving scheduled 500 mg BID.

## 2016-01-22 NOTE — Telephone Encounter (Signed)
Catherine [phone] called from [facility] regarding pt tylenol. A DC order is needed for PRN Tylenol 500 mg every six hours as needed. Fax to [phone]. Thank you!

## 2016-01-22 NOTE — Assessment & Plan Note (Signed)
Resolved,  Repeat assessment today is due  Lab Results  Component Value Date   CREATININE 0.97 03/20/2015   Lab Results  Component Value Date   NA 138 03/20/2015   K 4.0 03/20/2015   CL 107 03/20/2015   CO2 27 03/20/2015

## 2016-01-22 NOTE — Assessment & Plan Note (Signed)
Using a walker ,  Has supervision and no recent falls.

## 2016-01-22 NOTE — Assessment & Plan Note (Signed)
Vascular,  by CT/MRI.  She has developed bowel  and bladder incontinence.  Nighttime agitation controlled with seroquel and lorazepam.  DNR status reviewed ;  son  has POA

## 2016-01-25 ENCOUNTER — Encounter: Payer: Self-pay | Admitting: *Deleted

## 2016-01-25 NOTE — Telephone Encounter (Signed)
Letter drafted

## 2016-01-29 DIAGNOSIS — Z23 Encounter for immunization: Secondary | ICD-10-CM | POA: Diagnosis not present

## 2016-01-30 NOTE — Telephone Encounter (Signed)
Letter faxed.

## 2016-02-05 NOTE — Telephone Encounter (Signed)
Mailed unread message to patient.  

## 2016-02-25 ENCOUNTER — Other Ambulatory Visit: Payer: Self-pay | Admitting: Internal Medicine

## 2016-03-08 DIAGNOSIS — S51812A Laceration without foreign body of left forearm, initial encounter: Secondary | ICD-10-CM | POA: Diagnosis not present

## 2016-03-10 ENCOUNTER — Telehealth: Payer: Self-pay | Admitting: Internal Medicine

## 2016-03-10 NOTE — Telephone Encounter (Signed)
[facility] called and needed to make pt appt for a follow up on ER visit for laceration on left arm. Please advise as to where to schedule, thank you!  Call pt @ [phone]

## 2016-03-10 NOTE — Telephone Encounter (Signed)
[facility] dropped off a form to be signed about pt urgent care visit.Catherine Meyers. Placed in Dr. Melina Schoolsullo's folder up front..Catherine Meyers

## 2016-03-11 ENCOUNTER — Encounter: Payer: Self-pay | Admitting: Internal Medicine

## 2016-03-11 NOTE — Telephone Encounter (Signed)
Yes ok to schedule with margaret

## 2016-03-11 NOTE — Telephone Encounter (Signed)
Verbal order for Ua  Via  in and out cath.  2) Home health for suture removal would be better than putting her on margaret' s schedule. Per previous message

## 2016-03-11 NOTE — Telephone Encounter (Signed)
[facility] sutures placed on 03/08/16, need to be removed in 10 days. No openings with PCP ok to schedule with NP? Form placed in quick sign.

## 2016-03-11 NOTE — Telephone Encounter (Signed)
Schedule patient with NP on 19 th for suture removal.

## 2016-03-11 NOTE — Telephone Encounter (Signed)
Facility requesting Ua per in and out cath to be collected by [facility] but patient would also need order or referral for [facility] nursing until follow up for suture removal.

## 2016-03-11 NOTE — Telephone Encounter (Signed)
Called to make appt on 12/19 at 1:30. The facility wants to know if [name] would put in a order for a urine sample. They would also like to know if they could get orders for [facility] to come in and follow up on pt until they can get her to come into the office. Please advise, thank you!  Fax - [phone]

## 2016-03-12 NOTE — Telephone Encounter (Signed)
Orders in blue folder for you to sign

## 2016-03-12 NOTE — Telephone Encounter (Signed)
Facility cannot except verbal I have printed order and placed in quick sign.

## 2016-03-12 NOTE — Telephone Encounter (Signed)
All  Orders have been faxed as requested.

## 2016-03-12 NOTE — Telephone Encounter (Signed)
Patient was seen Saturday am at [facility] for laceration .  They didn't give wound care orders.  .  Per Velta AddisonKaren Pattereson [phone] orders must come from PCP .  Also they think patient has UTI , patient urine has strong odor , symptoms of confusion patient has urinary incontinence,  symptoms have been present for 1 day.  Would also like orders for urinalysis.  Please advise.  Would like orders sent to [facility] .

## 2016-03-13 DIAGNOSIS — F0390 Unspecified dementia without behavioral disturbance: Secondary | ICD-10-CM | POA: Diagnosis not present

## 2016-03-13 DIAGNOSIS — S51812D Laceration without foreign body of left forearm, subsequent encounter: Secondary | ICD-10-CM | POA: Diagnosis not present

## 2016-03-13 DIAGNOSIS — F419 Anxiety disorder, unspecified: Secondary | ICD-10-CM | POA: Diagnosis not present

## 2016-03-18 ENCOUNTER — Ambulatory Visit: Payer: Medicare Other | Admitting: Family

## 2016-03-18 DIAGNOSIS — F419 Anxiety disorder, unspecified: Secondary | ICD-10-CM | POA: Diagnosis not present

## 2016-03-18 DIAGNOSIS — S51812D Laceration without foreign body of left forearm, subsequent encounter: Secondary | ICD-10-CM | POA: Diagnosis not present

## 2016-03-18 DIAGNOSIS — F0390 Unspecified dementia without behavioral disturbance: Secondary | ICD-10-CM | POA: Diagnosis not present

## 2016-03-21 DIAGNOSIS — F0390 Unspecified dementia without behavioral disturbance: Secondary | ICD-10-CM | POA: Diagnosis not present

## 2016-03-21 DIAGNOSIS — S51812D Laceration without foreign body of left forearm, subsequent encounter: Secondary | ICD-10-CM | POA: Diagnosis not present

## 2016-03-21 DIAGNOSIS — F419 Anxiety disorder, unspecified: Secondary | ICD-10-CM | POA: Diagnosis not present

## 2016-03-22 ENCOUNTER — Other Ambulatory Visit: Payer: Self-pay | Admitting: Internal Medicine

## 2016-03-26 DIAGNOSIS — S51812D Laceration without foreign body of left forearm, subsequent encounter: Secondary | ICD-10-CM | POA: Diagnosis not present

## 2016-03-26 DIAGNOSIS — F0390 Unspecified dementia without behavioral disturbance: Secondary | ICD-10-CM | POA: Diagnosis not present

## 2016-03-26 DIAGNOSIS — F419 Anxiety disorder, unspecified: Secondary | ICD-10-CM | POA: Diagnosis not present

## 2016-03-28 DIAGNOSIS — F0390 Unspecified dementia without behavioral disturbance: Secondary | ICD-10-CM | POA: Diagnosis not present

## 2016-03-28 DIAGNOSIS — S51812D Laceration without foreign body of left forearm, subsequent encounter: Secondary | ICD-10-CM | POA: Diagnosis not present

## 2016-03-28 DIAGNOSIS — F419 Anxiety disorder, unspecified: Secondary | ICD-10-CM | POA: Diagnosis not present

## 2016-04-01 ENCOUNTER — Other Ambulatory Visit: Payer: Self-pay | Admitting: Internal Medicine

## 2016-04-10 DIAGNOSIS — F419 Anxiety disorder, unspecified: Secondary | ICD-10-CM | POA: Diagnosis not present

## 2016-04-10 DIAGNOSIS — F0390 Unspecified dementia without behavioral disturbance: Secondary | ICD-10-CM | POA: Diagnosis not present

## 2016-04-10 DIAGNOSIS — S51812D Laceration without foreign body of left forearm, subsequent encounter: Secondary | ICD-10-CM | POA: Diagnosis not present

## 2016-04-14 DIAGNOSIS — B351 Tinea unguium: Secondary | ICD-10-CM | POA: Diagnosis not present

## 2016-04-14 DIAGNOSIS — M79675 Pain in left toe(s): Secondary | ICD-10-CM | POA: Diagnosis not present

## 2016-04-14 DIAGNOSIS — M79674 Pain in right toe(s): Secondary | ICD-10-CM | POA: Diagnosis not present

## 2016-04-21 ENCOUNTER — Other Ambulatory Visit: Payer: Self-pay | Admitting: Internal Medicine

## 2016-04-22 ENCOUNTER — Encounter: Payer: Self-pay | Admitting: Internal Medicine

## 2016-04-22 ENCOUNTER — Other Ambulatory Visit: Payer: Self-pay | Admitting: Internal Medicine

## 2016-04-22 MED ORDER — LORAZEPAM 0.5 MG PO TABS: ORAL_TABLET | ORAL | 3 refills | Status: DC

## 2016-05-03 ENCOUNTER — Other Ambulatory Visit: Payer: Self-pay | Admitting: Internal Medicine

## 2016-06-16 ENCOUNTER — Other Ambulatory Visit: Payer: Self-pay | Admitting: Internal Medicine

## 2016-07-21 ENCOUNTER — Encounter: Payer: Self-pay | Admitting: Internal Medicine

## 2016-07-21 ENCOUNTER — Ambulatory Visit (INDEPENDENT_AMBULATORY_CARE_PROVIDER_SITE_OTHER): Payer: Medicare Other | Admitting: Internal Medicine

## 2016-07-21 DIAGNOSIS — R1311 Dysphagia, oral phase: Secondary | ICD-10-CM

## 2016-07-21 DIAGNOSIS — F0151 Vascular dementia with behavioral disturbance: Secondary | ICD-10-CM

## 2016-07-21 DIAGNOSIS — R296 Repeated falls: Secondary | ICD-10-CM

## 2016-07-21 DIAGNOSIS — R1310 Dysphagia, unspecified: Secondary | ICD-10-CM | POA: Insufficient documentation

## 2016-07-21 DIAGNOSIS — F01518 Vascular dementia, unspecified severity, with other behavioral disturbance: Secondary | ICD-10-CM

## 2016-07-21 MED ORDER — LORATADINE 10 MG PO TABS: 10.0000 mg | ORAL_TABLET | Freq: Every day | ORAL | 1 refills | Status: DC

## 2016-07-21 NOTE — Progress Notes (Signed)
Pre visit review using our clinic review tool, if applicable. No additional management support is needed unless otherwise documented below in the visit note. 

## 2016-07-21 NOTE — Progress Notes (Signed)
Subjective:  Patient ID: Catherine Meyers, female    DOB: 08-Oct-1927  Age: 83 y.o. MRN: [id number]  CC: Diagnoses of Oral phase dysphagia, Recurrent falls while walking, and Vascular dementia with behavior disturbance were pertinent to this visit.  HPI Catherine Meyers presents for follow up on dementia.  Brought in by [employer] caregiver from facility where she resides,  " [facility]" .  Facility does not have a dysphagia diet And has noticed some coughing and regurgitation at meal time.  Patient feeds herself but staff does help her eat  If she is distracted.  Seated in a wheelchair for convenience but per staff she is walking well with the use of a walker and has not had any recent falls.      No nocturnal agitation with use of seroquel. Incontinent of urine and feces,  No pressure ulcers or dermatitis with q 2 hour diaper checks.     Outpatient Medications Prior to Visit  Medication Sig Dispense Refill  . acetaminophen (MAPAP) 500 MG tablet Take 1 tablet (500 mg total) by mouth every 6 (six) hours as needed. 60 tablet 11  . alum & mag hydroxide-simeth (MAALOX/MYLANTA) 200-200-20 MG/5ML suspension Take 15 mLs by mouth every 6 (six) hours as needed for indigestion or heartburn.    . benzonatate (TESSALON) 200 MG capsule Take 1 capsule (200 mg total) by mouth 3 (three) times daily as needed for cough. 60 capsule 1  . bismuth subsalicylate (PEPTO BISMOL) 262 MG/15ML suspension Take 30 mLs by mouth every 6 (six) hours as needed.    . Cranberry Fruit 405 MG CAPS TAKE 1 CAPSULE BY MOUTH TWICE DAILY FOR URINARY HEALTH 60 each 6  . docusate sodium (COLACE) 100 MG capsule Take 1 capsule (100 mg total) by mouth 2 (two) times daily. 10 capsule 0  . GNP ARTHRITIS PAIN RELIEF 650 MG CR tablet TAKE 1 TABLET BY MOUTH TWICE DAILY FOR DISCOMFORT 60 tablet 3  . guaiFENesin (ROBITUSSIN) 100 MG/5ML liquid Take 200 mg by mouth 3 (three) times daily as needed for cough.    . loperamide (IMODIUM) 2 MG capsule Take by  mouth as needed for diarrhea or loose stools.    Marland Kitchen LORazepam (ATIVAN) 0.5 MG tablet TAKE 1 TABLET BY MOUTH ONCE DAILY AT BEDTIME FOR ANXIETY 30 tablet 3  . omeprazole (PRILOSEC) 40 MG capsule TAKE 1 CAPSULE BY MOUTH ONCE DAILY FOR REFLUX 90 capsule 2  . QUEtiapine (SEROQUEL) 50 MG tablet TAKE 1 TABLET BY MOUTH ONCE DAILY AT BEDTIME FOR PSYCHOSIS 30 tablet 3  . Vitamin D, Ergocalciferol, (DRISDOL) 50000 units CAPS capsule Take 1 capsule (50,000 Units total) by mouth every 14 (fourteen) days. 6 capsule 3  . magnesium hydroxide (MILK OF MAGNESIA) 400 MG/5ML suspension Take 15 mLs by mouth daily as needed for mild constipation.     No facility-administered medications prior to visit.     Review of Systems;  Patient denies headache, fevers, malaise, unintentional weight loss, skin rash, eye pain, sinus congestion and sinus pain, sore throat, dysphagia,  hemoptysis , cough, dyspnea, wheezing, chest pain, palpitations, orthopnea, edema, abdominal pain, nausea, melena, diarrhea, constipation, flank pain, dysuria, hematuria, urinary  Frequency, nocturia, numbness, tingling, seizures,  Focal weakness, Loss of consciousness,  Tremor, insomnia, depression, anxiety, and suicidal ideation.      Objective:  BP 110/74   Pulse 70   Temp 98 F (36.7 C) (Oral)   Resp 14   Ht  (1.626 m)  Wt 147 lb (66.7 kg)   SpO2 91%   BMI 25.23 kg/m   BP Readings from Last 3 Encounters:  07/21/16 110/74  01/21/16 118/70  09/12/15 118/68    Wt Readings from Last 3 Encounters:  07/21/16 147 lb (66.7 kg)  01/21/16 147 lb 8 oz (66.9 kg)  09/12/15 144 lb 1.9 oz (65.4 kg)    General appearance: alert, cooperative and appears stated age Neck: no adenopathy, no carotid bruit, supple, symmetrical, trachea midline and thyroid not enlarged, symmetric, no tenderness/mass/nodules Back: symmetric, no curvature. ROM normal. No CVA tenderness. Lungs: clear to auscultation bilaterally Heart: regular rate and rhythm,  S1, S2 normal, no murmur, click, rub or gallop Abdomen: soft, non-tender; bowel sounds normal; no masses,  no organomegaly Pulses: 2+ and symmetric Skin: Skin color, texture, turgor normal. No rashes or lesions Lymph nodes: Cervical, supraclavicular, and axillary nodes normal.  No results found for: HGBA1C  Lab Results  Component Value Date   CREATININE 0.99 01/21/2016   CREATININE 0.97 03/20/2015   CREATININE 0.94 03/19/2015    Lab Results  Component Value Date   WBC 8.7 03/20/2015   HGB 13.2 03/20/2015   HCT 39.5 03/20/2015   PLT 184 03/20/2015   GLUCOSE 220 (H) 01/21/2016   CHOL 176 05/04/2012   TRIG 95.0 05/04/2012   HDL 55.70 05/04/2012   LDLCALC 101 (H) 05/04/2012   ALT 24 01/21/2016   AST 25 01/21/2016   NA 138 01/21/2016   K 3.8 01/21/2016   CL 104 01/21/2016   CREATININE 0.99 01/21/2016   BUN 19 01/21/2016   CO2 25 01/21/2016   TSH 3.76 01/21/2016    Dg Hand Complete Right  Result Date: 08/09/2015 CLINICAL DATA:  Fall 6 weeks ago with fifth metacarpal fracture, subsequent encounter EXAM: RIGHT HAND - COMPLETE 3+ VIEW COMPARISON:  07/01/15 FINDINGS: Angulated distal fifth metacarpal fracture is again noted with some mild callus formation. No new focal abnormality is seen. IMPRESSION: Healing fifth metacarpal fracture Electronically Signed   By: Alcide Clever M.D.   On: 08/09/2015 13:47    Assessment & Plan:   Problem List Items Addressed This Visit    Dementia with behavioral disturbance    Vascular,  by CT/MRI.  She has  bowel  and bladder incontinence.  Nighttime agitation is now controlled with seroquel and lorazepam.  DNR status reviewed ;  son above and  has POA.  She requires 24 hour supervision and resides at a memory care facility [facility]." she has developed some signs of dysphagia; mechanical diet recommended       Dysphagia   Recurrent falls while walking    None recently,  Using a walker          I am having Catherine Meyers start on  loratadine. I am also having her maintain her guaiFENesin, bismuth subsalicylate, alum & mag hydroxide-simeth, loperamide, magnesium hydroxide, docusate sodium, acetaminophen, benzonatate, Vitamin D (Ergocalciferol), Cranberry Fruit, QUEtiapine, LORazepam, omeprazole, and GNP ARTHRITIS PAIN RELIEF.  Meds ordered this encounter  Medications  . loratadine (CLARITIN) 10 MG tablet    Sig: Take 1 tablet (10 mg total) by mouth daily.    Dispense:  90 tablet    Refill:  1    There are no discontinued medications.  Follow-up: Return in about 6 months (around 01/20/2017).   Sherlene Shams, MD

## 2016-07-21 NOTE — Patient Instructions (Addendum)
Catherine Meyers may be losing the ability to swallow certain textures due to her dementia    I agree with adding claritin to see if post nasal drip is contributing to her cough  I would also Try blending or pureeing her meats , and avoiding cornbread or muffins (foods with very dry textures)  Dysphagia Diet Level 2, Mechanically Altered The dysphagia level 2 diet includes foods that are blended, chopped, ground, or mashed so they are easier to chew and swallow. The foods are soft, moist, and can be chopped into -inch chunks (such as pancakes, pasta, and bananas). In order to be on this diet, you must be able to chew. This diet helps you transition between the pureed textures of the dysphagia level 1 diet to more solid textures. This diet is helpful for people with mild to moderate swallowing difficulties. It reduces the risk of food getting caught in the windpipe, trachea, or lungs. You may need help or supervision during meals while following this diet so that you eat safely. You will be on this diet until your health care provider advances the texture of your diet. What do I need to know about this diet? Foods   You may eat foods that are soft and moist.  You may need to use a blender, whisk, or masher to soften some of your foods.  You can moisten foods with gravies, sauces, vegetable or fruit juice, milk, half and half, or water when blending, mashing, or grinding your foods to the right consistency.  If you were on the dysphagia level 1 diet, you may still eat any of the foods included in that diet.  Avoid foods that are dry, hard, sticky, chewy, coarse, and crunchy. Also avoid large cuts of food.  Take small bites. Each bite should contain  inch or less of food. Liquids   Avoid liquids with seeds and chunks.  Thicken liquids, if instructed by your health care provider. Your health care provider will tell you the consistency to which you should thicken your liquids for safe swallowing. To  thicken a liquid, use a commercial thickener or a thickening food (such as rice cereal or potato flakes). Ask your health care provider for specific recommendations on thickeners. See your dietitian or health care provider regularly for help with your dietary changes. What foods can I eat? Grains  Store-bought soft breads that do not have nuts or seeds. Pancakes, sweet rolls, [place] pastries, and [place] toast that have been moistened with syrup or sauce to form a slurry when blended. Well-cooked pasta, noodles, and bread dressing. Well-cooked noodles and pasta in sauce. Moist macaroni and cheese. Soft dumplings or spaetzle with gravy or butter. Cooked cereals (including oatmeal). Low-texture dry cereals, such as rice puff, corn, or wheat-flake cereals, with milk (if thin liquids are not allowed, make sure all of the milk is absorbed by the cereal before eating it). Vegetables  Very soft, well-cooked vegetables in pieces less than  inch in size. Cooked potatoes that are moist, not crispy, and with sauce. Fruits  Canned or cooked fruits that are soft or moist and do not have skin or seeds. Fresh, soft bananas. Fruit juices with a small amount of pulp (if thin liquids are allowed). Gelatin or plain gelatin with canned fruit, except pineapple. Meat and Other Protein Sources  Tender, moist meats, poultry, or fish cooked with gravy or sauce and cubed to -inch bites or smaller. Ground meat. Moist meatball or meatloaf. Fish without bones. Moist casseroles without rice.  Tuna, egg, or meat salad without chunks or hard-to-chew vegetables, such as celery and onions. Smooth quiche without large chunks. Scrambled, poached, or soft-cooked eggs with butter, margarine, sauce, or gravy. Tofu. Well-cooked, moistened and mashed beans, peas, baked beans, and other legumes. Casseroles without rice (such as tuna noodle casserole or soft moist meat lasagna). Dairy  Cream cheese. Yogurt. Cottage cheese. Ask your health care  provider if milk is allowed. Sweets/Desserts  Pudding. Custard. Soft fruit pies with crust on the bottom only. Crisps and cobblers without seeds or nuts and with soft crusts. Soft, moist cakes. Icing. Pre-gelled cookies. Soft, moist cookies dunked in milk, coffee, or another liquid. Jelly. Soft, smooth chocolate bars that are easily chewed. Jams and preserves without seeds. Ask your health care provider whether you can have frozen desserts. Fats and Oils  Butter. Margarine. Cream for cereal, depending on liquid consistency allowed. Gravy. Cream sauces. Mayonnaise. Salad dressings. Cream cheese. Cheese spreads, plain or with soft fruits or vegetables added. Sour cream. Sour cream dips with soft fruits or vegetables added. Whipped toppings. Other  Sauces and salsas that have soft chunks that are about  inch or smaller. The items listed above may not be a complete list of recommended foods or beverages. Contact your dietitian for more options.  What foods are not recommended? Grains  All breads not listed in the recommended list. Breads that are hard or have nuts or seeds. Coarse cereals. Cereals that have nuts, seeds, dried fruits, or coconut. Rice. Corn. Vegetables  Whole, raw, frozen, or dried vegetables. Tough, fibrous, chewy, or stringy cooked vegetables, such as celery, peas, broccoli, cabbage, Brussels sprouts, and asparagus. Potato skins. Potato and other vegetable chips. Fried or French-fried potatoes. Cooked corn and peas. Fruits  Whole raw, frozen, or dried fruits, including coconut. Pineapple. Fruits with seeds. Meat and Other Protein Sources  Dry, tough meats, such as bacon, sausage, and hot dogs. Cheese slices and cubes. Peanut butter. Hard boiled or fried eggs. Nuts. Seeds. Pizza. Sandwiches. Dry casseroles or casseroles with rice or large chunks. Dairy  Yogurt with nuts, seeds, or large chunks. Sweets/Desserts  Coarse, hard, chewy, or sticky desserts. Any dessert with nuts, seeds,  coconut, pineapple, or dried fruit. Ask your health care provider whether you can have frozen desserts. Fats and Oils  Avoid fats with chunky, large textures, such as those with nuts or fruits. Other  Soups and casseroles with large chunks. The items listed above may not be a complete list of foods and beverages to avoid. Contact your dietitian for more information.  This information is not intended to replace advice given to you by your health care provider. Make sure you discuss any questions you have with your health care provider. Document Released: 03/17/2005 Document Revised: 08/23/2015 Document Reviewed: 02/28/2013 Elsevier Interactive Patient Education  2017 [employer].

## 2016-07-22 NOTE — Assessment & Plan Note (Signed)
Vascular,  by CT/MRI.  She has  bowel  and bladder incontinence.  Nighttime agitation is now controlled with seroquel and lorazepam.  DNR status reviewed ;  son above and  has POA.  She requires 24 hour supervision and resides at a [facility] she has developed some signs of dysphagia; mechanical diet recommended

## 2016-07-22 NOTE — Assessment & Plan Note (Signed)
None recently,  Using a walker

## 2016-07-24 ENCOUNTER — Other Ambulatory Visit: Payer: Self-pay | Admitting: Internal Medicine

## 2016-09-09 ENCOUNTER — Ambulatory Visit (INDEPENDENT_AMBULATORY_CARE_PROVIDER_SITE_OTHER): Payer: Medicare Other

## 2016-09-09 VITALS — BP 110/68 | HR 76 | Temp 97.7°F | Resp 14 | Wt 147.8 lb

## 2016-09-09 DIAGNOSIS — Z0000 Encounter for general adult medical examination without abnormal findings: Secondary | ICD-10-CM | POA: Diagnosis not present

## 2016-09-09 NOTE — Patient Instructions (Addendum)
  Ms. Birder RobsonBright , Thank you for taking time to come for your Medicare Wellness Visit. I appreciate your ongoing commitment to your health goals. Please review the following plan we discussed and let me know if I can assist you in the future.   Follow up with [name] as needed.    Have a great day!  These are the goals we discussed: Goals    . Healhty Lifestyle          STAY HYDRATED AND DRINK PLENTY OF FLUIDS. VEGETABLES AND FRUITS. STAY ACTIVE AND CONTINUE CHAIR EXERCISING WITH STAFF.       This is a list of the screening recommended for you and due dates:  Health Maintenance  Topic Date Due  . Tetanus Vaccine  01/15/1947  . DEXA scan (bone density measurement)  01/16/1995  . Flu Shot  10/29/2016  . Pneumonia vaccines  Completed

## 2016-09-09 NOTE — Progress Notes (Signed)
Care was provided under my supervision. I agree with the management as indicated in the note.  Ahman Dugdale DO  

## 2016-09-09 NOTE — Progress Notes (Signed)
Subjective:   Catherine Meyers is a 83 y.o. female who presents for Medicare Annual (Subsequent) preventive examination.  Review of Systems:  No ROS.  Medicare Wellness Visit. Additional risk factors are reflected in the social history. Cardiac Risk Factors include: advanced age (>1955men, 84>65 women)     Objective:     Vitals: BP 110/68 (BP Location: Left Arm, Patient Position: Sitting, Cuff Size: Normal)   Pulse 76   Temp 97.7 F (36.5 C) (Oral)   Resp 14   Wt 147 lb 12.8 oz (67 kg)   SpO2 95%   BMI 25.37 kg/m   Body mass index is 25.37 kg/m.   Tobacco History  Smoking Status  . Never Smoker  Smokeless Tobacco  . Never Used     Counseling given: Not Answered   Past Medical History:  Diagnosis Date  . Arthritis    left knee  . Dementia   . Dementia   . Hepatitis   . Hepatitis, water-borne 1965  . Migraine   . Osteopenia   . Vitamin D deficiency disease    Past Surgical History:  Procedure Laterality Date  . ABDOMINAL HYSTERECTOMY  1986  . HIP PINNING,CANNULATED Right 03/18/2015   Procedure: CANNULATED HIP PINNING;  Surgeon: Kennedy BuckerMichael Menz, MD;  Location: [facility] ORS;  Service: Orthopedics;  Laterality: Right;  . KNEE ARTHROSCOPY  2003   Family History  Problem Relation Age of Onset  . Cancer Sister        breast   History  Sexual Activity  . Sexual activity: No    Outpatient Encounter Prescriptions as of 09/09/2016  Medication Sig  . acetaminophen (MAPAP) 500 MG tablet Take 1 tablet (500 mg total) by mouth every 6 (six) hours as needed.  Marland Kitchen. alum & mag hydroxide-simeth (MAALOX/MYLANTA) 200-200-20 MG/5ML suspension Take 15 mLs by mouth every 6 (six) hours as needed for indigestion or heartburn.  . benzonatate (TESSALON) 200 MG capsule Take 1 capsule (200 mg total) by mouth 3 (three) times daily as needed for cough.  . bismuth subsalicylate (PEPTO BISMOL) 262 MG/15ML suspension Take 30 mLs by mouth every 6 (six) hours as needed.  . Cranberry Fruit 405 MG CAPS  TAKE 1 CAPSULE BY MOUTH TWICE DAILY FOR URINARY HEALTH  . docusate sodium (COLACE) 100 MG capsule Take 1 capsule (100 mg total) by mouth 2 (two) times daily.  . GNP ARTHRITIS PAIN RELIEF 650 MG CR tablet TAKE 1 TABLET BY MOUTH TWICE DAILY FOR DISCOMFORT  . guaiFENesin (ROBITUSSIN) 100 MG/5ML liquid Take 200 mg by mouth 3 (three) times daily as needed for cough.  . loperamide (IMODIUM) 2 MG capsule Take by mouth as needed for diarrhea or loose stools.  Marland Kitchen. loratadine (CLARITIN) 10 MG tablet Take 1 tablet (10 mg total) by mouth daily.  Marland Kitchen. LORazepam (ATIVAN) 0.5 MG tablet TAKE 1 TABLET BY MOUTH ONCE DAILY AT BEDTIME FOR ANXIETY  . magnesium hydroxide (MILK OF MAGNESIA) 400 MG/5ML suspension Take 15 mLs by mouth daily as needed for mild constipation.  Marland Kitchen. omeprazole (PRILOSEC) 40 MG capsule TAKE 1 CAPSULE BY MOUTH ONCE DAILY FOR REFLUX  . QUEtiapine (SEROQUEL) 50 MG tablet TAKE 1 TABLET BY MOUTH ONCE DAILY AT BEDTIME FOR PSYCHOSIS  . Vitamin D, Ergocalciferol, (DRISDOL) 50000 units CAPS capsule Take 1 capsule (50,000 Units total) by mouth every 14 (fourteen) days.   No facility-administered encounter medications on file as of 09/09/2016.     Activities of Daily Living In your present state of health, do  you have any difficulty performing the following activities: 09/09/2016 09/12/2015  Hearing? N Y  Vision? N N  Difficulty concentrating or making decisions? Malvin Johns  Walking or climbing stairs? Y Y  Dressing or bathing? Y Y  Doing errands, shopping? Malvin Johns  Preparing Food and eating ? Y Y  Using the Toilet? Y Y  In the past six months, have you accidently leaked urine? Y N  Do you have problems with loss of bowel control? Y N  Managing your Medications? Y Y  Managing your Finances? Malvin Johns  Housekeeping or managing your Housekeeping? Malvin Johns  Some recent data might be hidden    Patient Care Team: Sherlene Shams, MD as PCP - General (Internal Medicine)    Assessment:    This is a routine wellness  examination for [facility]. The goal of the wellness visit is to assist the patient how to close the gaps in care and create a preventative care plan for the patient. Care giver, [facility] presents with her and provides information.  The roster of all physicians providing medical care to patient is listed in the Snapshot section of the chart.  Taking calcium VIT D as appropriate/Osteoporosis risk reviewed.    Safety issues reviewed; [facility].  Smoke and carbon monoxide detectors in the home. No firearms in the home.  Wears seatbelts when riding with others. Patient does wear sunscreen or protective clothing when in direct sunlight. No violence in the home.  Depression- PHQ 2 &9 complete.  No signs/symptoms or verbal communication regarding little pleasure in doing things, feeling down, depressed or hopeless. Expresses no changes in sleeping, energy, eating, concentrating.  Expresses no thoughts of self harm or harm towards others.  Time spent on this topic is 8 minutes.   Patient is alert, normal appearance, oriented to person.     Assistance required with ADL's.  Staff assists.  Ambulates with walker.  BMI- discussed the importance of a healthy diet, water intake and the benefits of aerobic exercise. Educational material provided.   24 hour diet recall: Regular diet. Staff prepares/provides 3 meals daily.  Care giver is uncertain of the name of the physician and timeline with dental and eye exam. She does wear glasses daily.                Sleep patterns- Sleeps through the night.    Patient Concerns: None at this time. Follow up with PCP as needed.  Exercise Activities and Dietary recommendations Current Exercise Habits: Structured exercise class, Time (Minutes): 20, Frequency (Times/Week): 5, Weekly Exercise (Minutes/Week): 100, Intensity: Mild  Goals    . Healhty Lifestyle          STAY HYDRATED AND DRINK  PLENTY OF FLUIDS. VEGETABLES AND FRUITS. STAY ACTIVE AND CONTINUE CHAIR EXERCISING WITH STAFF.      Fall Risk Fall Risk  09/09/2016 09/12/2015 05/31/2013 05/04/2012  Falls in the past year? No Yes No -  Number falls in past yr: - 2 or more - -  Injury with Fall? - Yes - -  Risk Factor Category  - High Fall Risk - -  Risk for fall due to : - Impaired mobility - Impaired mobility;Orthopedic patient  Follow up - Falls prevention discussed;Education provided - -   Depression Screen PHQ 2/9 Scores 09/09/2016 09/12/2015 05/31/2013 05/04/2012  PHQ - 2 Score 0 0 0 2  PHQ- 9 Score - - -  10     Cognitive Function MMSE - Mini Mental State Exam 09/09/2016 09/12/2015  Not completed: Unable to complete Unable to complete        Immunization History  Administered Date(s) Administered  . Influenza-Unspecified 01/05/2014, 12/24/2015  . PPD Test 11/08/2013  . Pneumococcal Conjugate-13 11/01/2013  . Pneumococcal Polysaccharide-23 09/12/2015   Screening Tests Health Maintenance  Topic Date Due  . TETANUS/TDAP  01/15/1947  . DEXA SCAN  01/16/1995  . INFLUENZA VACCINE  10/29/2016  . PNA vac Low Risk Adult  Completed      Plan:    End of life planning; Advance aging; Advanced directives discussed. Copy of current HCPOA/Living Will on file.    I have personally reviewed and noted the following in the patient's chart:   . Medical and social history . Use of alcohol, tobacco or illicit drugs  . Current medications and supplements . Functional ability and status . Nutritional status . Physical activity . Advanced directives . List of other physicians . Hospitalizations, surgeries, and ER visits in previous 12 months . Vitals . Screenings to include cognitive, depression, and falls . Referrals and appointments  In addition, I have reviewed and discussed with patient certain preventive protocols, quality metrics, and best practice recommendations. A written personalized care plan for preventive  services as well as general preventive health recommendations were provided to patient.     Ashok Pall, LPN  1/61/0960

## 2016-09-26 DIAGNOSIS — M79674 Pain in right toe(s): Secondary | ICD-10-CM | POA: Diagnosis not present

## 2016-09-26 DIAGNOSIS — M79675 Pain in left toe(s): Secondary | ICD-10-CM | POA: Diagnosis not present

## 2016-09-26 DIAGNOSIS — B351 Tinea unguium: Secondary | ICD-10-CM | POA: Diagnosis not present

## 2016-10-06 ENCOUNTER — Other Ambulatory Visit: Payer: Self-pay | Admitting: Internal Medicine

## 2016-10-13 ENCOUNTER — Other Ambulatory Visit: Payer: Self-pay | Admitting: Internal Medicine

## 2016-10-20 ENCOUNTER — Other Ambulatory Visit: Payer: Self-pay | Admitting: Internal Medicine

## 2016-10-27 ENCOUNTER — Other Ambulatory Visit: Payer: Self-pay | Admitting: Internal Medicine

## 2016-10-28 IMAGING — CT CT HEAD W/O CM
1 series · 16 of 30 positions shown, 20 images · non-contrast
Comparison: 06/07/2015 and 09/14/2014

CLINICAL DATA: Fall.

EXAM:
CT HEAD WITHOUT CONTRAST
TECHNIQUE: Contiguous axial images were obtained from the base of the skull
through the vertex without intravenous contrast.

[Series 2: head wo · axial · 0.42mm/px · z∈[-117,+12]mm · 16 of 32 slices shown, 20 images]
[im 2/32  brain]
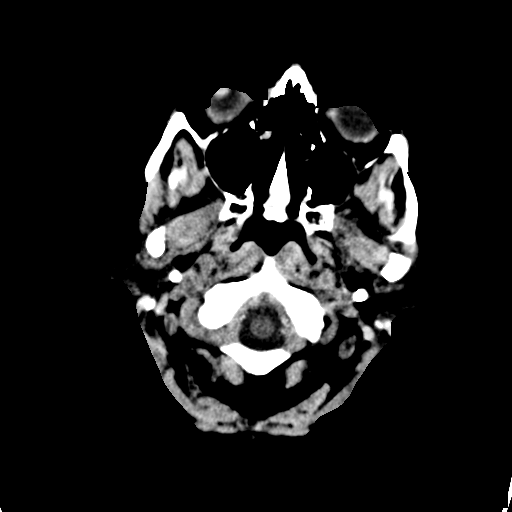
[im 2/32  bone]
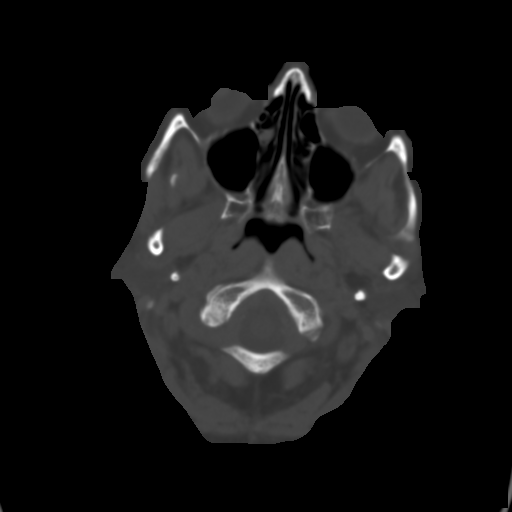
[im 4/32  brain]
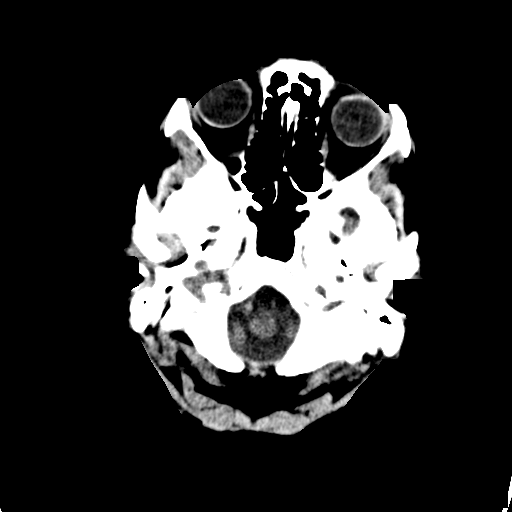
[im 6/32  brain]
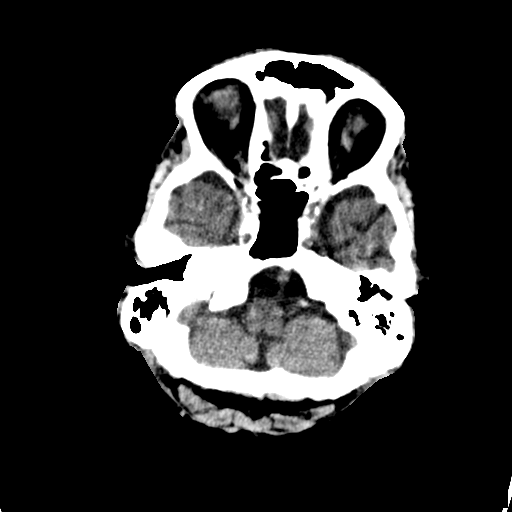
[im 8/32  brain]
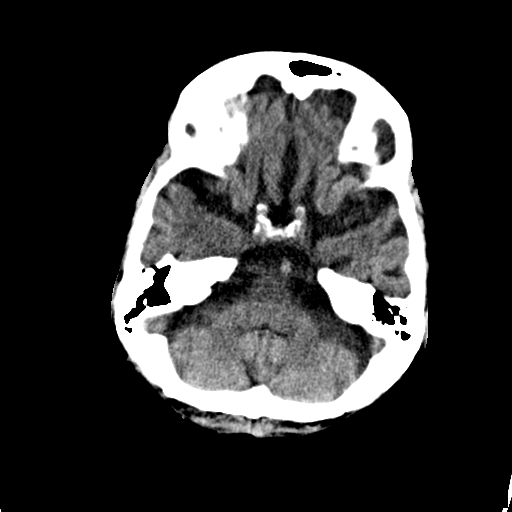
[im 9/32  brain]
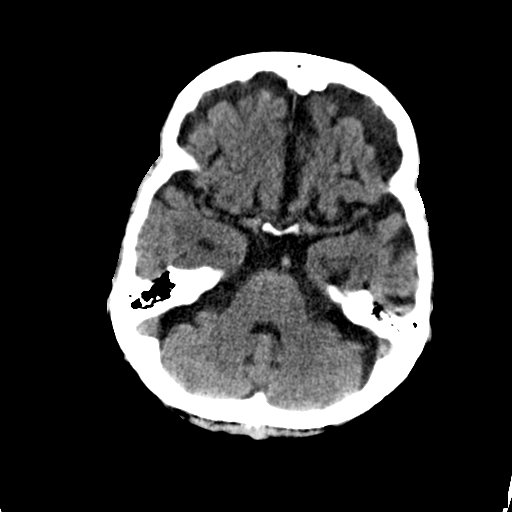
[im 9/32  bone]
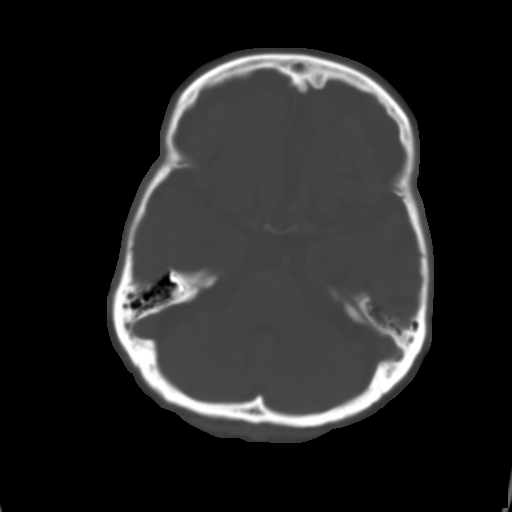
[im 11/32  brain]
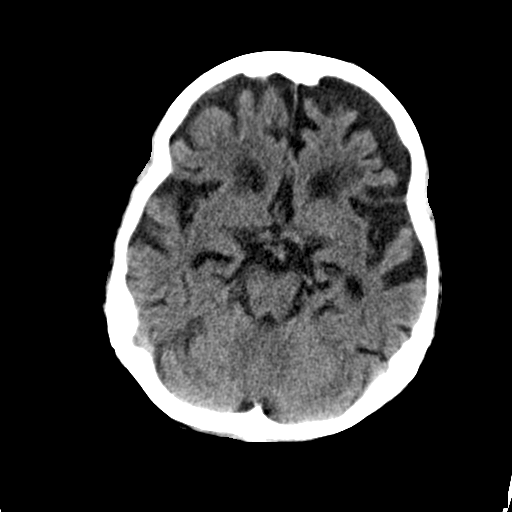
[im 13/32  brain]
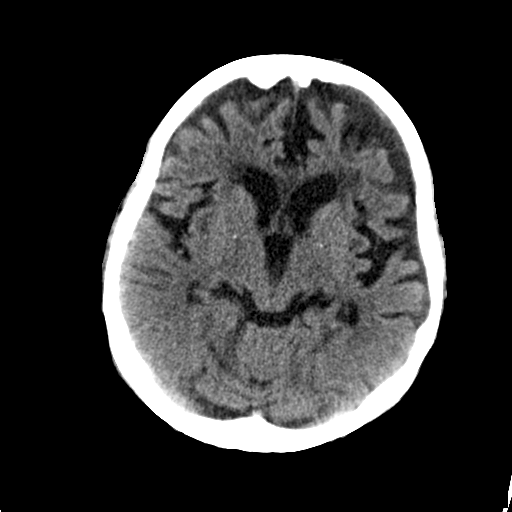
[im 15/32  brain]
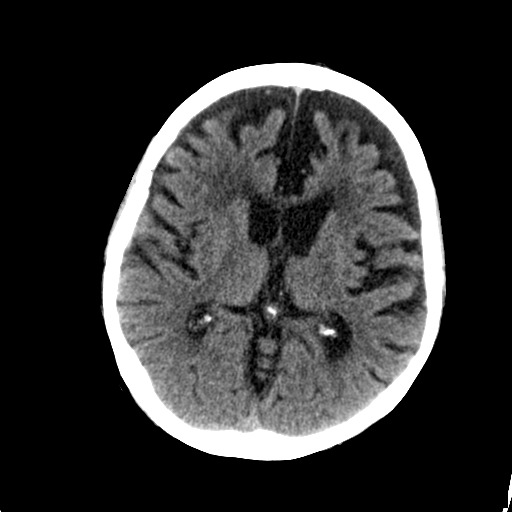
[im 17/32  brain]
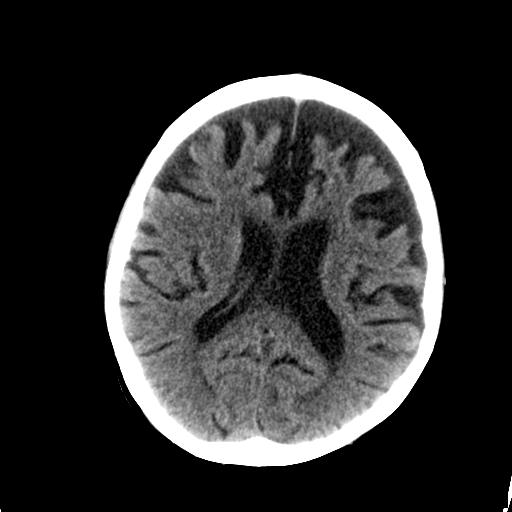
[im 17/32  bone]
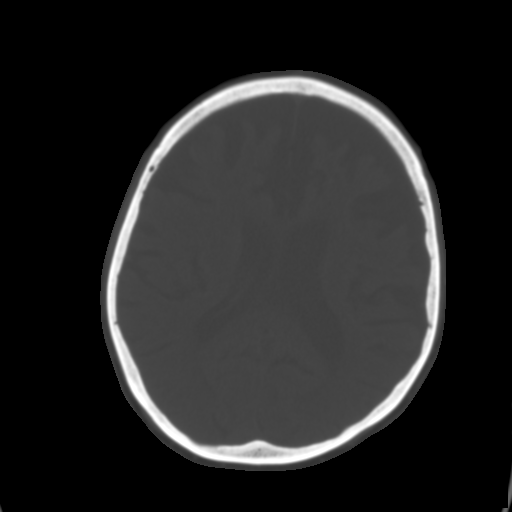
[im 19/32  brain]
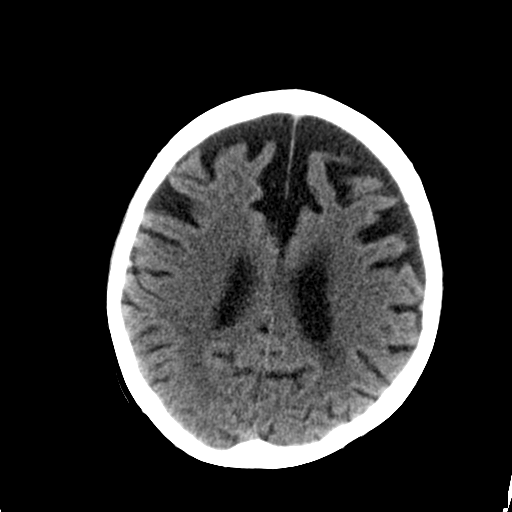
[im 21/32  brain]
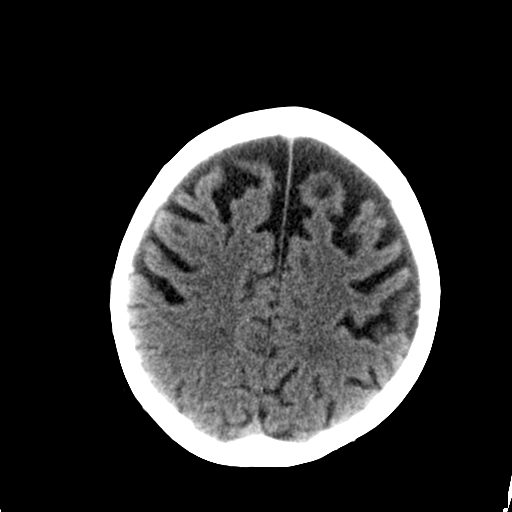
[im 23/32  brain]
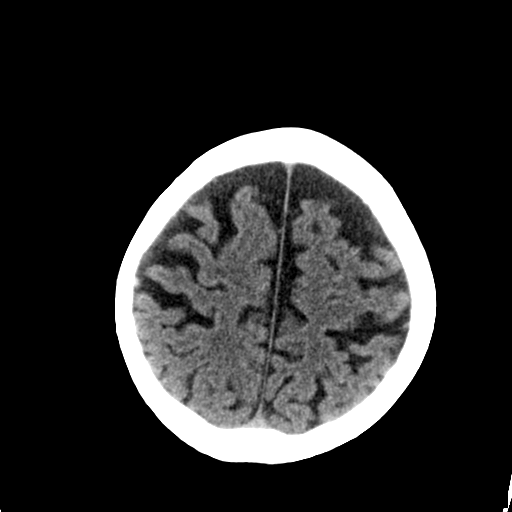
[im 24/32  brain]
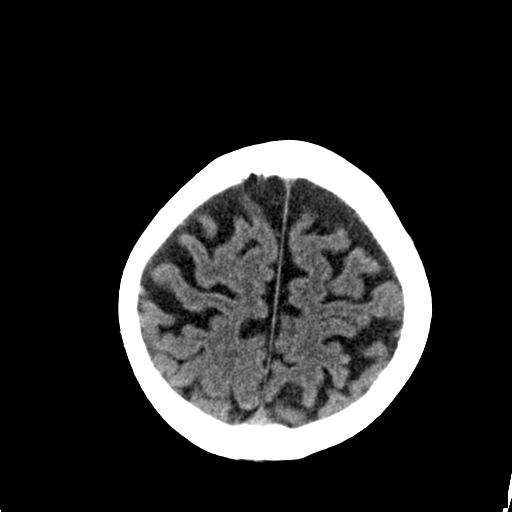
[im 24/32  bone]
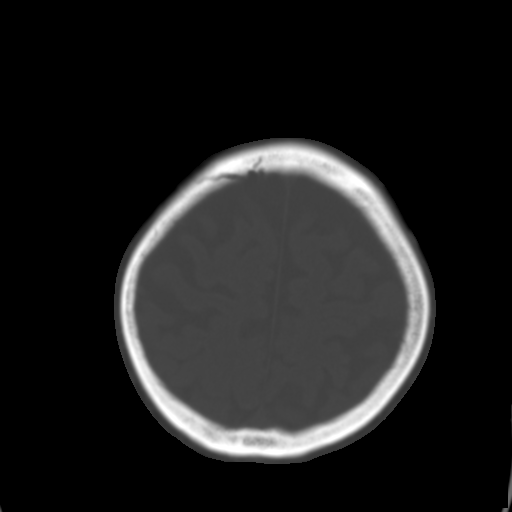
[im 26/32  brain]
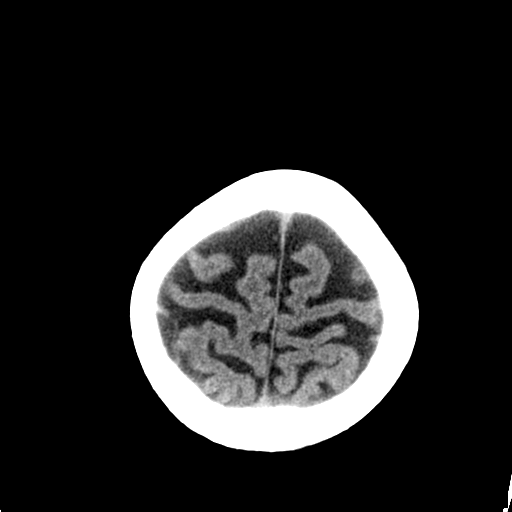
[im 28/32  brain]
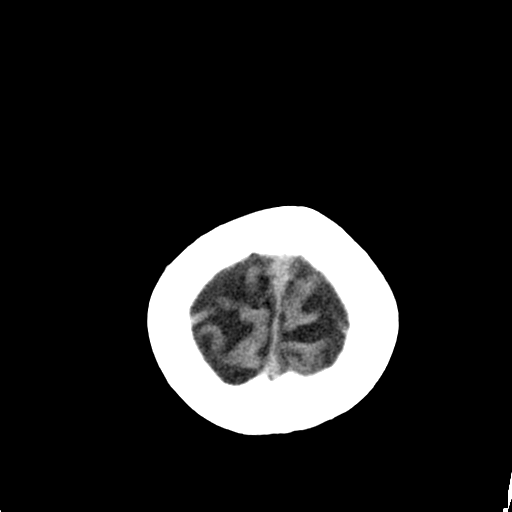
[im 30/32  brain]
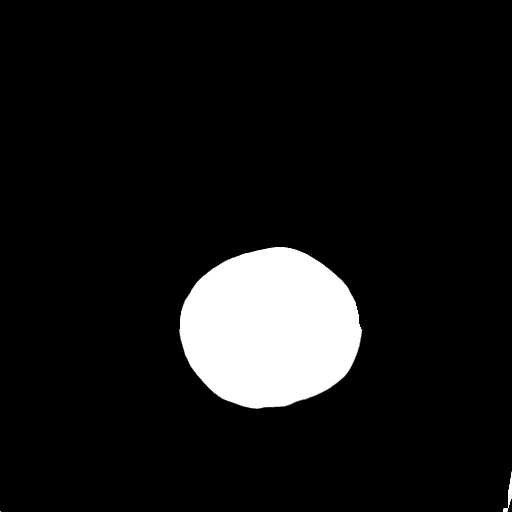

[16 of 30 positions shown; findings below may reference images not displayed]

FINDINGS: Ventricles and cisterns are within normal. Prominence of the
frontoparietal CSF spaces unchanged likely atrophy. There is chronic
ischemic microvascular disease. There is no mass, mass effect, shift
of midline structures or acute hemorrhage. No evidence of acute
infarction. Bones and soft tissues are within normal.
IMPRESSION: No acute intracranial findings.

Chronic ischemic microvascular disease. Atrophic change over the
frontoparietal region which is stable.

## 2016-10-28 IMAGING — CR DG HAND COMPLETE 3+V*R*
3 series · 4 of 4 positions shown · non-contrast
Comparison: None.

CLINICAL DATA: Fall today with right hand injury.

EXAM:
RIGHT HAND - COMPLETE 3+ VIEW

[hand ap]
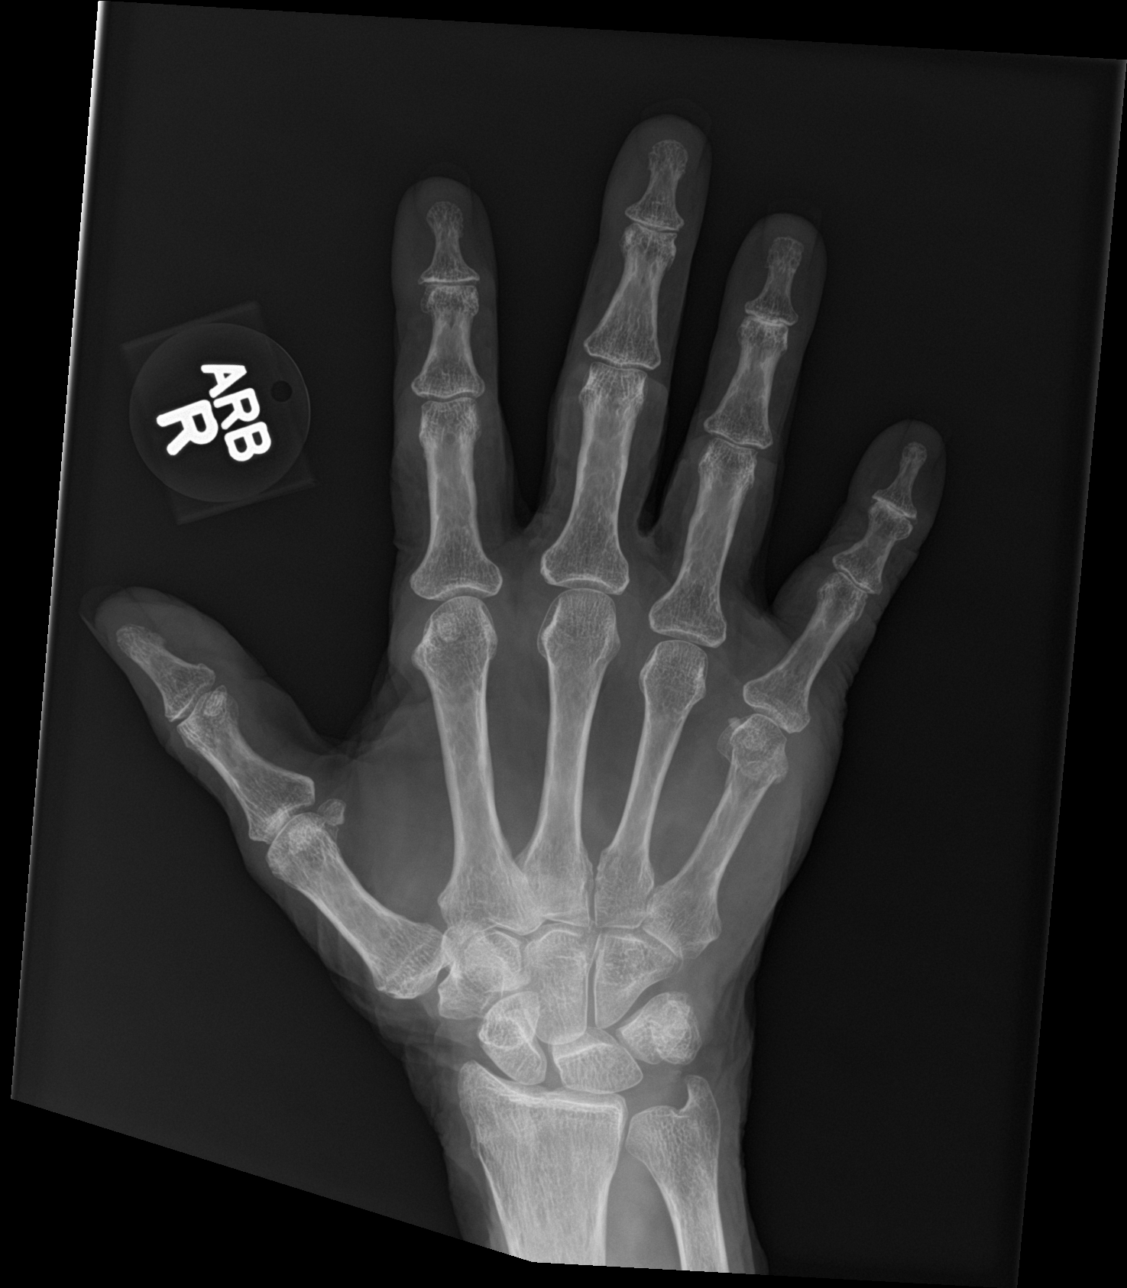

[Series 2: hand obl · 0.14mm/px · 2 of 2 slices shown]
[im 1/2]
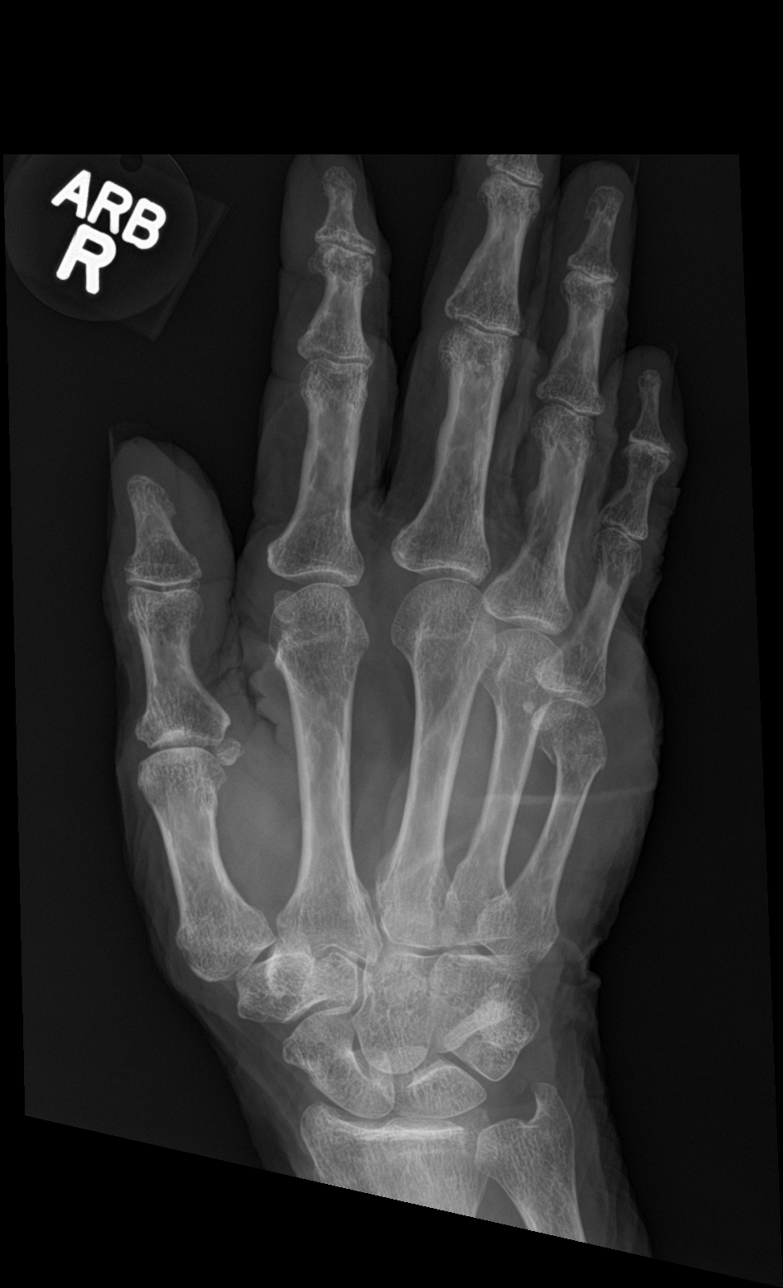
[im 2/2]
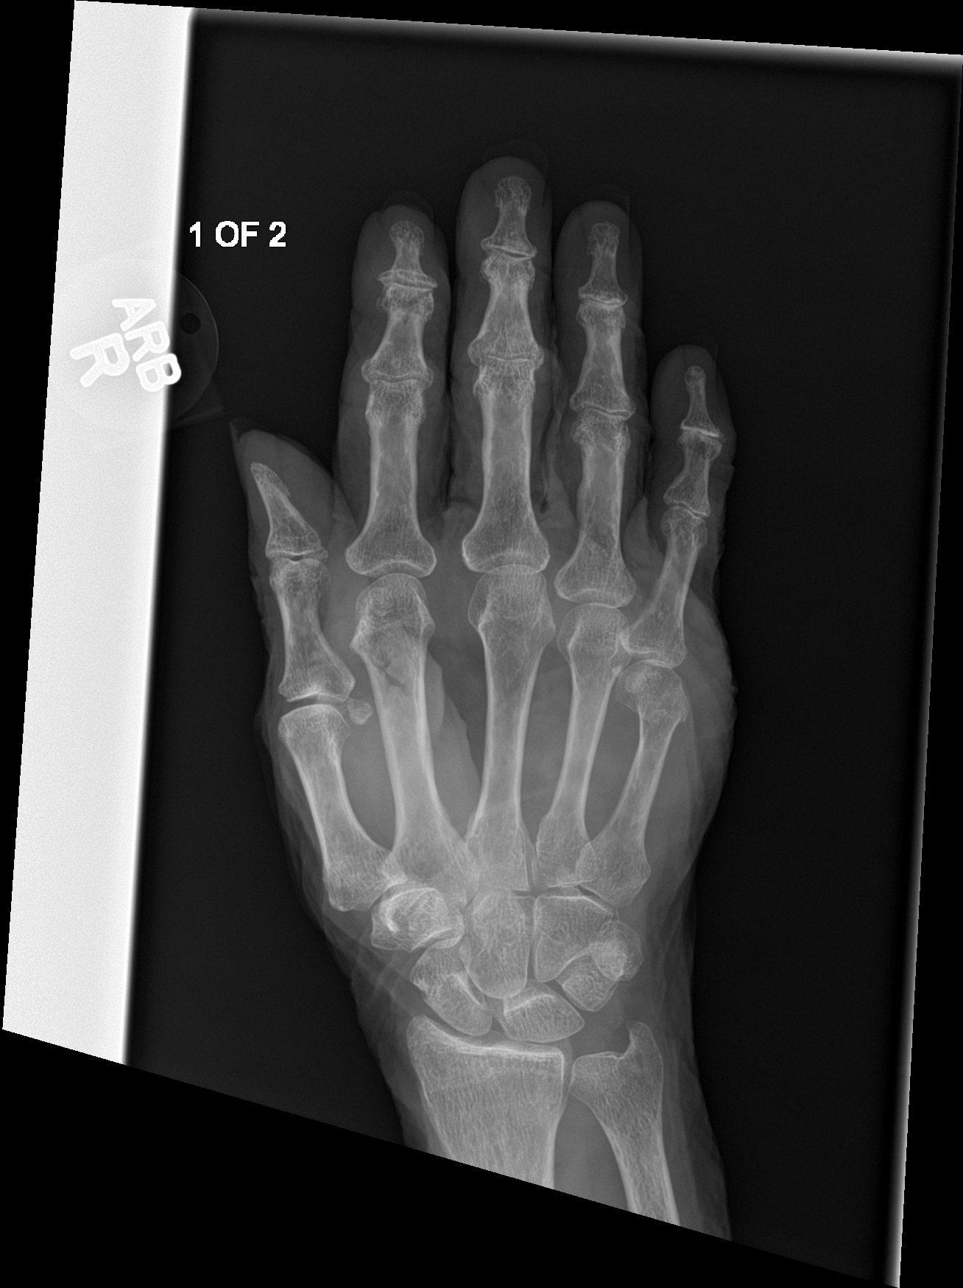

[hand lat]
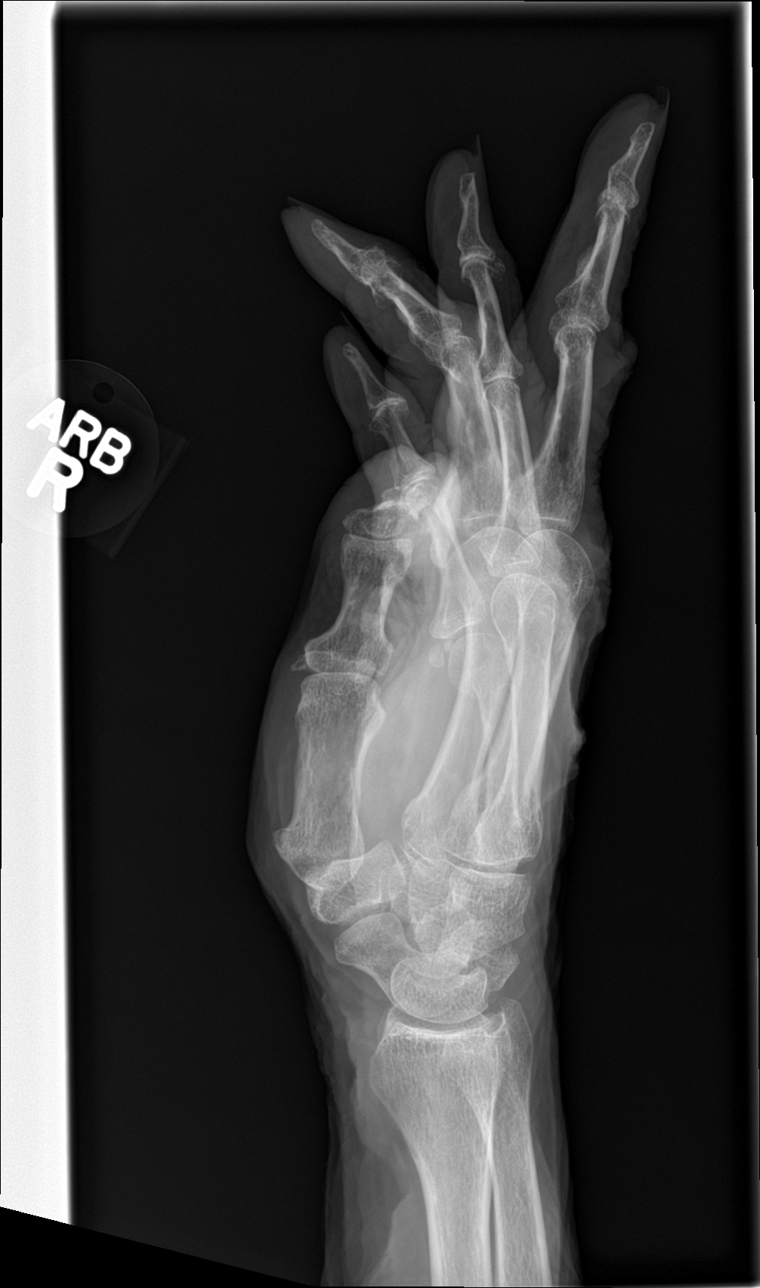

[4 of 4 positions shown; findings below may reference images not displayed]

FINDINGS: Mild degenerative change of the radiocarpal joint and carpal bones.
Mild degenerate change over the interphalangeal joints. There is a
minimally displaced fracture of the head of the fifth metacarpal
with slight radial and volar angulation of the distal fragment.
Remainder of the exam is unremarkable.
IMPRESSION: Minimally displaced fracture of the head of the fifth metacarpal.

Degenerative changes as described.

## 2016-11-17 ENCOUNTER — Other Ambulatory Visit: Payer: Self-pay | Admitting: Internal Medicine

## 2016-11-17 NOTE — Telephone Encounter (Signed)
yesm continue every 2 weeks supplement .  Sent rx to [facility]

## 2016-11-17 NOTE — Telephone Encounter (Signed)
Last Vitamin D level was drawn in 01/21/2016, last office visit was in April of this year. Please advise for either refill or d/c thanks

## 2016-11-29 ENCOUNTER — Other Ambulatory Visit: Payer: Self-pay | Admitting: Internal Medicine

## 2016-12-12 ENCOUNTER — Other Ambulatory Visit: Payer: Self-pay | Admitting: Internal Medicine

## 2016-12-26 DIAGNOSIS — F0150 Vascular dementia without behavioral disturbance: Secondary | ICD-10-CM | POA: Diagnosis not present

## 2016-12-26 DIAGNOSIS — F329 Major depressive disorder, single episode, unspecified: Secondary | ICD-10-CM | POA: Diagnosis not present

## 2016-12-26 DIAGNOSIS — K219 Gastro-esophageal reflux disease without esophagitis: Secondary | ICD-10-CM | POA: Diagnosis not present

## 2016-12-26 DIAGNOSIS — B351 Tinea unguium: Secondary | ICD-10-CM | POA: Diagnosis not present

## 2016-12-26 DIAGNOSIS — M1990 Unspecified osteoarthritis, unspecified site: Secondary | ICD-10-CM | POA: Diagnosis not present

## 2016-12-30 DIAGNOSIS — E038 Other specified hypothyroidism: Secondary | ICD-10-CM | POA: Diagnosis not present

## 2016-12-30 DIAGNOSIS — E119 Type 2 diabetes mellitus without complications: Secondary | ICD-10-CM | POA: Diagnosis not present

## 2016-12-30 DIAGNOSIS — E559 Vitamin D deficiency, unspecified: Secondary | ICD-10-CM | POA: Diagnosis not present

## 2016-12-30 DIAGNOSIS — Z79899 Other long term (current) drug therapy: Secondary | ICD-10-CM | POA: Diagnosis not present

## 2016-12-30 DIAGNOSIS — E7849 Other hyperlipidemia: Secondary | ICD-10-CM | POA: Diagnosis not present

## 2016-12-30 DIAGNOSIS — D518 Other vitamin B12 deficiency anemias: Secondary | ICD-10-CM | POA: Diagnosis not present

## 2017-01-12 ENCOUNTER — Other Ambulatory Visit: Payer: Self-pay | Admitting: Internal Medicine

## 2017-01-21 ENCOUNTER — Ambulatory Visit: Payer: Medicare Other | Admitting: Internal Medicine

## 2017-01-23 DIAGNOSIS — K219 Gastro-esophageal reflux disease without esophagitis: Secondary | ICD-10-CM | POA: Diagnosis not present

## 2017-01-23 DIAGNOSIS — F329 Major depressive disorder, single episode, unspecified: Secondary | ICD-10-CM | POA: Diagnosis not present

## 2017-01-23 DIAGNOSIS — M1990 Unspecified osteoarthritis, unspecified site: Secondary | ICD-10-CM | POA: Diagnosis not present

## 2017-01-23 DIAGNOSIS — F0150 Vascular dementia without behavioral disturbance: Secondary | ICD-10-CM | POA: Diagnosis not present

## 2017-01-29 ENCOUNTER — Other Ambulatory Visit: Payer: Self-pay | Admitting: Internal Medicine

## 2017-02-11 ENCOUNTER — Other Ambulatory Visit: Payer: Self-pay | Admitting: Internal Medicine

## 2017-02-11 DIAGNOSIS — F329 Major depressive disorder, single episode, unspecified: Secondary | ICD-10-CM | POA: Diagnosis not present

## 2017-02-11 DIAGNOSIS — F0151 Vascular dementia with behavioral disturbance: Secondary | ICD-10-CM | POA: Diagnosis not present

## 2017-02-12 DIAGNOSIS — Z23 Encounter for immunization: Secondary | ICD-10-CM | POA: Diagnosis not present

## 2017-02-20 ENCOUNTER — Other Ambulatory Visit: Payer: Self-pay | Admitting: Internal Medicine

## 2017-02-20 DIAGNOSIS — F0150 Vascular dementia without behavioral disturbance: Secondary | ICD-10-CM | POA: Diagnosis not present

## 2017-02-20 DIAGNOSIS — M1990 Unspecified osteoarthritis, unspecified site: Secondary | ICD-10-CM | POA: Diagnosis not present

## 2017-02-20 DIAGNOSIS — K219 Gastro-esophageal reflux disease without esophagitis: Secondary | ICD-10-CM | POA: Diagnosis not present

## 2017-02-20 DIAGNOSIS — F329 Major depressive disorder, single episode, unspecified: Secondary | ICD-10-CM | POA: Diagnosis not present

## 2017-02-23 NOTE — Telephone Encounter (Signed)
Refilled: 10/27/2016 Last OV: 07/21/2016 Next OV: not scheduled

## 2017-02-24 DIAGNOSIS — Z0000 Encounter for general adult medical examination without abnormal findings: Secondary | ICD-10-CM | POA: Diagnosis not present

## 2017-02-24 DIAGNOSIS — F329 Major depressive disorder, single episode, unspecified: Secondary | ICD-10-CM | POA: Diagnosis not present

## 2017-02-24 DIAGNOSIS — M1990 Unspecified osteoarthritis, unspecified site: Secondary | ICD-10-CM | POA: Diagnosis not present

## 2017-02-24 DIAGNOSIS — K219 Gastro-esophageal reflux disease without esophagitis: Secondary | ICD-10-CM | POA: Diagnosis not present

## 2017-02-24 DIAGNOSIS — E559 Vitamin D deficiency, unspecified: Secondary | ICD-10-CM | POA: Diagnosis not present

## 2017-03-10 ENCOUNTER — Other Ambulatory Visit: Payer: Self-pay | Admitting: Internal Medicine

## 2017-03-18 DIAGNOSIS — F0151 Vascular dementia with behavioral disturbance: Secondary | ICD-10-CM | POA: Diagnosis not present

## 2017-03-27 DIAGNOSIS — F0150 Vascular dementia without behavioral disturbance: Secondary | ICD-10-CM | POA: Diagnosis not present

## 2017-03-27 DIAGNOSIS — K219 Gastro-esophageal reflux disease without esophagitis: Secondary | ICD-10-CM | POA: Diagnosis not present

## 2017-03-27 DIAGNOSIS — M1990 Unspecified osteoarthritis, unspecified site: Secondary | ICD-10-CM | POA: Diagnosis not present

## 2017-03-27 DIAGNOSIS — E559 Vitamin D deficiency, unspecified: Secondary | ICD-10-CM | POA: Diagnosis not present

## 2017-04-10 DIAGNOSIS — E119 Type 2 diabetes mellitus without complications: Secondary | ICD-10-CM | POA: Diagnosis not present

## 2017-04-10 DIAGNOSIS — E7849 Other hyperlipidemia: Secondary | ICD-10-CM | POA: Diagnosis not present

## 2017-04-10 DIAGNOSIS — Z79899 Other long term (current) drug therapy: Secondary | ICD-10-CM | POA: Diagnosis not present

## 2017-04-10 DIAGNOSIS — E559 Vitamin D deficiency, unspecified: Secondary | ICD-10-CM | POA: Diagnosis not present

## 2017-04-10 DIAGNOSIS — E038 Other specified hypothyroidism: Secondary | ICD-10-CM | POA: Diagnosis not present

## 2017-04-10 DIAGNOSIS — D518 Other vitamin B12 deficiency anemias: Secondary | ICD-10-CM | POA: Diagnosis not present

## 2017-04-23 ENCOUNTER — Other Ambulatory Visit: Payer: Self-pay | Admitting: Internal Medicine

## 2017-04-23 DIAGNOSIS — B351 Tinea unguium: Secondary | ICD-10-CM | POA: Diagnosis not present

## 2017-04-23 DIAGNOSIS — M79672 Pain in left foot: Secondary | ICD-10-CM | POA: Diagnosis not present

## 2017-04-23 DIAGNOSIS — L84 Corns and callosities: Secondary | ICD-10-CM | POA: Diagnosis not present

## 2017-04-23 DIAGNOSIS — I739 Peripheral vascular disease, unspecified: Secondary | ICD-10-CM | POA: Diagnosis not present

## 2017-04-24 DIAGNOSIS — F0151 Vascular dementia with behavioral disturbance: Secondary | ICD-10-CM | POA: Diagnosis not present

## 2017-04-24 DIAGNOSIS — F0150 Vascular dementia without behavioral disturbance: Secondary | ICD-10-CM | POA: Diagnosis not present

## 2017-04-24 DIAGNOSIS — M1990 Unspecified osteoarthritis, unspecified site: Secondary | ICD-10-CM | POA: Diagnosis not present

## 2017-04-24 DIAGNOSIS — K219 Gastro-esophageal reflux disease without esophagitis: Secondary | ICD-10-CM | POA: Diagnosis not present

## 2017-05-15 DIAGNOSIS — F0150 Vascular dementia without behavioral disturbance: Secondary | ICD-10-CM | POA: Diagnosis not present

## 2017-05-15 DIAGNOSIS — M1990 Unspecified osteoarthritis, unspecified site: Secondary | ICD-10-CM | POA: Diagnosis not present

## 2017-05-15 DIAGNOSIS — E559 Vitamin D deficiency, unspecified: Secondary | ICD-10-CM | POA: Diagnosis not present

## 2017-05-15 DIAGNOSIS — K219 Gastro-esophageal reflux disease without esophagitis: Secondary | ICD-10-CM | POA: Diagnosis not present

## 2017-05-27 DIAGNOSIS — F22 Delusional disorders: Secondary | ICD-10-CM | POA: Diagnosis not present

## 2017-05-27 DIAGNOSIS — F0150 Vascular dementia without behavioral disturbance: Secondary | ICD-10-CM | POA: Diagnosis not present

## 2017-06-12 DIAGNOSIS — K219 Gastro-esophageal reflux disease without esophagitis: Secondary | ICD-10-CM | POA: Diagnosis not present

## 2017-06-12 DIAGNOSIS — I739 Peripheral vascular disease, unspecified: Secondary | ICD-10-CM | POA: Diagnosis not present

## 2017-06-12 DIAGNOSIS — F0151 Vascular dementia with behavioral disturbance: Secondary | ICD-10-CM | POA: Diagnosis not present

## 2017-06-12 DIAGNOSIS — M1990 Unspecified osteoarthritis, unspecified site: Secondary | ICD-10-CM | POA: Diagnosis not present

## 2017-06-23 ENCOUNTER — Emergency Department
Admission: EM | Admit: 2017-06-23 | Discharge: 2017-06-23 | Disposition: A | Discharge status: Home / Self Care | Payer: Medicare Other | Attending: Emergency Medicine | Admitting: Emergency Medicine

## 2017-06-23 ENCOUNTER — Emergency Department: Payer: Medicare Other

## 2017-06-23 DIAGNOSIS — W1830XA Fall on same level, unspecified, initial encounter: Secondary | ICD-10-CM | POA: Diagnosis not present

## 2017-06-23 DIAGNOSIS — Z79899 Other long term (current) drug therapy: Secondary | ICD-10-CM | POA: Diagnosis not present

## 2017-06-23 DIAGNOSIS — F0390 Unspecified dementia without behavioral disturbance: Secondary | ICD-10-CM | POA: Diagnosis not present

## 2017-06-23 DIAGNOSIS — Y92129 Unspecified place in nursing home as the place of occurrence of the external cause: Secondary | ICD-10-CM | POA: Diagnosis not present

## 2017-06-23 DIAGNOSIS — Y999 Unspecified external cause status: Secondary | ICD-10-CM | POA: Diagnosis not present

## 2017-06-23 DIAGNOSIS — R079 Chest pain, unspecified: Secondary | ICD-10-CM | POA: Diagnosis not present

## 2017-06-23 DIAGNOSIS — W19XXXA Unspecified fall, initial encounter: Secondary | ICD-10-CM

## 2017-06-23 DIAGNOSIS — Z23 Encounter for immunization: Secondary | ICD-10-CM | POA: Diagnosis not present

## 2017-06-23 DIAGNOSIS — S199XXA Unspecified injury of neck, initial encounter: Secondary | ICD-10-CM | POA: Diagnosis not present

## 2017-06-23 DIAGNOSIS — S0990XA Unspecified injury of head, initial encounter: Secondary | ICD-10-CM | POA: Diagnosis not present

## 2017-06-23 DIAGNOSIS — Y939 Activity, unspecified: Secondary | ICD-10-CM | POA: Insufficient documentation

## 2017-06-23 DIAGNOSIS — N390 Urinary tract infection, site not specified: Secondary | ICD-10-CM | POA: Diagnosis not present

## 2017-06-23 DIAGNOSIS — S0101XA Laceration without foreign body of scalp, initial encounter: Secondary | ICD-10-CM | POA: Diagnosis not present

## 2017-06-23 LAB — URINALYSIS, COMPLETE (UACMP) WITH MICROSCOPIC
BILIRUBIN URINE: NEGATIVE
GLUCOSE, UA: NEGATIVE mg/dL
Hgb urine dipstick: NEGATIVE
KETONES UR: NEGATIVE mg/dL
Nitrite: POSITIVE — AB
PH: 6 (ref 5.0–8.0)
Protein, ur: NEGATIVE mg/dL
SPECIFIC GRAVITY, URINE: 1.012 (ref 1.005–1.030)
SQUAMOUS EPITHELIAL / LPF: NONE SEEN

## 2017-06-23 LAB — CBC WITH DIFFERENTIAL/PLATELET
BASOS ABS: 0.1 10*3/uL (ref 0–0.1)
BASOS PCT: 1 %
EOS ABS: 0.1 10*3/uL (ref 0–0.7)
Eosinophils Relative: 2 %
HCT: 40.4 % (ref 35.0–47.0)
Hemoglobin: 13.5 g/dL (ref 12.0–16.0)
Lymphocytes Relative: 22 %
Lymphs Abs: 1.5 10*3/uL (ref 1.0–3.6)
MCH: 32.1 pg (ref 26.0–34.0)
MCHC: 33.5 g/dL (ref 32.0–36.0)
MCV: 95.7 fL (ref 80.0–100.0)
MONO ABS: 0.7 10*3/uL (ref 0.2–0.9)
MONOS PCT: 10 %
Neutro Abs: 4.3 10*3/uL (ref 1.4–6.5)
Neutrophils Relative %: 65 %
PLATELETS: 220 10*3/uL (ref 150–440)
RBC: 4.22 MIL/uL (ref 3.80–5.20)
RDW: 13.3 % (ref 11.5–14.5)
WBC: 6.7 10*3/uL (ref 3.6–11.0)

## 2017-06-23 LAB — COMPREHENSIVE METABOLIC PANEL
ALBUMIN: 3.7 g/dL (ref 3.5–5.0)
ALK PHOS: 86 U/L (ref 38–126)
ALT: 17 U/L (ref 14–54)
AST: 25 U/L (ref 15–41)
Anion gap: 9 (ref 5–15)
BILIRUBIN TOTAL: 0.8 mg/dL (ref 0.3–1.2)
BUN: 15 mg/dL (ref 6–20)
CALCIUM: 8.8 mg/dL — AB (ref 8.9–10.3)
CO2: 25 mmol/L (ref 22–32)
Chloride: 106 mmol/L (ref 101–111)
Creatinine, Ser: 0.83 mg/dL (ref 0.44–1.00)
GFR calc Af Amer: >60 mL/min (ref >60)
GLUCOSE: 165 mg/dL — AB (ref 65–99)
POTASSIUM: 3.6 mmol/L (ref 3.5–5.1)
Sodium: 140 mmol/L (ref 135–145)
TOTAL PROTEIN: 6.8 g/dL (ref 6.5–8.1)

## 2017-06-23 LAB — TROPONIN I

## 2017-06-23 MED ORDER — CIPROFLOXACIN IN D5W 400 MG/200ML IV SOLN
400.0000 mg | Freq: Once | INTRAVENOUS | Status: AC
  Administered 2017-06-23: 400 mg via INTRAVENOUS
  Filled 2017-06-23: qty 200

## 2017-06-23 MED ORDER — TETANUS-DIPHTH-ACELL PERTUSSIS 5-2.5-18.5 LF-MCG/0.5 IM SUSP
0.5000 mL | Freq: Once | INTRAMUSCULAR | Status: AC
  Administered 2017-06-23: 0.5 mL via INTRAMUSCULAR
  Filled 2017-06-23: qty 0.5

## 2017-06-23 MED ORDER — LIDOCAINE-EPINEPHRINE 1 %-1:100000 IJ SOLN: 10.0000 mL | Freq: Once | INTRAMUSCULAR | Status: DC

## 2017-06-23 MED ORDER — CIPROFLOXACIN HCL 500 MG PO TABS: 500.0000 mg | ORAL_TABLET | Freq: Two times a day (BID) | ORAL | 0 refills | Status: AC

## 2017-06-23 NOTE — ED Notes (Signed)
Spoke with admin from patient's group home who states she would be coming as soon as possible to sit with patient and take her home. Informed that patient would be getting IV antibiotics then up for discharge.

## 2017-06-23 NOTE — ED Notes (Signed)
Patient cleaned and brief changed. Repositioned in bed and warm blanket given.

## 2017-06-23 NOTE — ED Notes (Signed)
Called and left HIPAA compliant message with group home admin. Will try again at a later time.

## 2017-06-23 NOTE — ED Triage Notes (Signed)
Pt came in by EMS from group home after being found on the bathroom floor. Pt with lac noted to posterior head, alert and awake at this time.

## 2017-06-23 NOTE — ED Provider Notes (Signed)
[facility] Emergency Department Provider Note  ____________________________________________   First MD Initiated Contact with Patient 06/23/17 [phone]     (approximate)  I have reviewed the triage vital signs and the nursing notes.   HISTORY  Chief Complaint Fall  History limited by the patient's dementia  HPI Catherine Meyers is a 84 y.o. female who comes to the emergency department via EMS from her group home after being found on the bathroom floor.  EMS noted a laceration to her posterior head and placed in a cervical collar.  The patient is unable to provide any meaningful history.  Past Medical History:  Diagnosis Date  . Arthritis    left knee  . Dementia   . Dementia   . Hepatitis   . Hepatitis, water-borne 1965  . Migraine   . Osteopenia   . Vitamin D deficiency disease     Patient Active Problem List   Diagnosis Date Noted  . Dysphagia 07/21/2016  . Fracture of fifth metacarpal bone of right hand 08/09/2015  . Osteoarthritis of right hip 03/17/2015  . Recurrent falls while walking 11/05/2014  . Do not resuscitate status 11/05/2014  . Dementia with behavioral disturbance   . Sprain of ankle 11/04/2013  . Osteopenia of the elderly 11/04/2013  . Encounter for preventive health examination 06/02/2013  . Vascular dementia with depressed mood 05/30/2012  . Vitamin D deficiency 05/05/2012  . Depression 05/04/2012    Past Surgical History:  Procedure Laterality Date  . ABDOMINAL HYSTERECTOMY  1986  . HIP PINNING,CANNULATED Right 03/18/2015   Procedure: CANNULATED HIP PINNING;  Surgeon: Kennedy BuckerMichael Menz, MD;  Location: [facility] ORS;  Service: Orthopedics;  Laterality: Right;  . KNEE ARTHROSCOPY  2003    Prior to Admission medications   Medication Sig Start Date End Date Taking? Authorizing Provider  acetaminophen (MAPAP) 500 MG tablet Take 1 tablet (500 mg total) by mouth every 6 (six) hours as needed. 06/12/15   Sherlene Shamsullo, Teresa L, MD  alum & mag  hydroxide-simeth (MAALOX/MYLANTA) 200-200-20 MG/5ML suspension Take 15 mLs by mouth every 6 (six) hours as needed for indigestion or heartburn.    [provider]  benzonatate (TESSALON) 200 MG capsule Take 1 capsule (200 mg total) by mouth 3 (three) times daily as needed for cough. 06/26/15   Sherlene Shamsullo, Teresa L, MD  bismuth subsalicylate (PEPTO BISMOL) 262 MG/15ML suspension Take 30 mLs by mouth every 6 (six) hours as needed.    [provider]  ciprofloxacin (CIPRO) 500 MG tablet Take 1 tablet (500 mg total) by mouth 2 (two) times daily for 5 days. 06/23/17 06/28/17  Merrily Brittleifenbark, Shannan Slinker, MD  Cranberry Fruit 405 MG CAPS TAKE 1 CAPSULE BY MOUTH TWICE DAILY FOR URINARY HEALTH 01/12/17   Sherlene Shamsullo, Teresa L, MD  docusate sodium (COLACE) 100 MG capsule Take 1 capsule (100 mg total) by mouth 2 (two) times daily. 03/21/15   Enid BaasKalisetti, Radhika, MD  [facility] ARTHRITIS PAIN RELIEF 650 MG CR tablet TAKE 1 TABLET BY MOUTH TWICE DAILY FOR DISCOMFORT 04/23/17   Sherlene Shamsullo, Teresa L, MD  guaiFENesin (ROBITUSSIN) 100 MG/5ML liquid Take 200 mg by mouth 3 (three) times daily as needed for cough.    [provider]  loperamide (IMODIUM) 2 MG capsule TAKE PER STANDING AS NEEDED ORDERS 03/13/17   Sherlene Shamsullo, Teresa L, MD  loratadine (CLARITIN) 10 MG tablet TAKE 1 TABLET BY MOUTH ONCE DAILY FOR ALLERGIES. 12/02/16   Sherlene Shamsullo, Teresa L, MD  LORazepam (ATIVAN) 0.5 MG tablet TAKE 1  TABLET BY MOUTH ONCE DAILY AT BEDTIME FOR ANXIETY 12/15/16   Sherlene Shams, MD  magnesium hydroxide (MILK OF MAGNESIA) 400 MG/5ML suspension Take 15 mLs by mouth daily as needed for mild constipation.    [provider]  omeprazole (PRILOSEC) 40 MG capsule TAKE 1 CAPSULE BY MOUTH ONCE DAILY FOR REFLUX 02/11/17   Sherlene Shams, MD  QUEtiapine (SEROQUEL) 50 MG tablet TAKE 1 TABLET BY MOUTH ONCE DAILY AT BEDTIME FOR PSYCHOSIS 02/23/17   Sherlene Shams, MD  Vitamin D, Ergocalciferol, (DRISDOL) 50000 units CAPS capsule TAKE 1 CAPSULE BY MOUTH  EVERY 14 DAYS FOR SUPPLEMENT 11/17/16   Sherlene Shams, MD    Allergies Cefaclor  Family History  Problem Relation Age of Onset  . Cancer Sister        breast    Social History Social History   Tobacco Use  . Smoking status: Never Smoker  . Smokeless tobacco: Never Used  Substance Use Topics  . Alcohol use: No  . Drug use: No    Review of Systems History limited by the patient's dementia  ____________________________________________   PHYSICAL EXAM:  VITAL SIGNS: ED Triage Vitals  Enc Vitals Group     BP      Pulse      Resp      Temp      Temp src      SpO2      Weight      Height      Head Circumference      Peak Flow      Pain Score      Pain Loc      Pain Edu?      Excl. in GC?     Constitutional: Pleasant cooperative severe dementia no acute distress Eyes: PERRL EOMI. Head: 3 cm laceration to posterior occiput Nose: No congestion/rhinnorhea. Mouth/Throat: No trismus Neck: No stridor.  No step-offs or tenderness Cardiovascular: Normal rate, regular rhythm. Grossly normal heart sounds.  Good peripheral circulation.  Chest wall stable Respiratory: Normal respiratory effort.  No retractions. Lungs CTAB and moving good air Gastrointestinal: Soft nontender Musculoskeletal: No lower extremity edema   Neurologic: . No gross focal neurologic deficits are appreciated. Skin:  Skin is warm, dry and intact. No rash noted. Psychiatric: Severe dementia    ____________________________________________   DIFFERENTIAL includes but not limited to  Mechanical fall, syncope, urinary tract infection, dehydration, electrolyte disturbance, laceration ____________________________________________   LABS (all labs ordered are listed, but only abnormal results are displayed)  Labs Reviewed  URINE CULTURE - Abnormal; Notable for the following components:      Result Value   Culture >=100,000 COLONIES/mL ESCHERICHIA COLI (*)    Organism ID, Bacteria ESCHERICHIA  COLI (*)    All other components within normal limits  URINALYSIS, COMPLETE (UACMP) WITH MICROSCOPIC - Abnormal; Notable for the following components:   Color, Urine YELLOW (*)    APPearance HAZY (*)    Nitrite POSITIVE (*)    Leukocytes, UA LARGE (*)    Bacteria, UA FEW (*)    All other components within normal limits  COMPREHENSIVE METABOLIC PANEL - Abnormal; Notable for the following components:   Glucose, Bld 165 (*)    Calcium 8.8 (*)    All other components within normal limits  TROPONIN I  CBC WITH DIFFERENTIAL/PLATELET    Lab work reviewed by me suggestive of urinary tract infection __________________________________________  EKG    ____________________________________________  RADIOLOGY  Head and neck  CTs reviewed by me with no acute disease ____________________________________________   PROCEDURES  Procedure(s) performed: Yes  .Marland KitchenLaceration Repair Date/Time: 06/23/2017 2:32 AM Performed by: Merrily Brittle, MD Authorized by: Merrily Brittle, MD   Consent:    Consent obtained:  Verbal   Consent given by:  Patient   Risks discussed:  Infection, pain and poor cosmetic result   Alternatives discussed:  No treatment and delayed treatment Anesthesia (see MAR for exact dosages):    Anesthesia method:  None Laceration details:    Location:  Scalp   Scalp location:  Occipital   Length (cm):  3 Repair type:    Repair type:  Simple Pre-procedure details:    Preparation:  Patient was prepped and draped in usual sterile fashion Treatment:    Irrigation solution:  Sterile saline Skin repair:    Repair method:  Staples   Number of staples:  4    Critical Care performed: no  Observation: no ____________________________________________   INITIAL IMPRESSION / ASSESSMENT AND PLAN / ED COURSE  Pertinent labs & imaging results that were available during my care of the patient were reviewed by me and considered in my medical decision making (see chart for  details).  The patient arrives after a fall versus syncope with a laceration to her posterior occiput.  CT scans are reassuring and cervical collar cleared.  The patient's neuro exam is normal.  Laceration washed out and closed with a total of 4 staples with good cosmesis.  In and out urinalysis is consistent with infection.  Patient is anaphylactic to cephalosporins so first dose of ciprofloxacin given here and a short course for home.  The patient is discharged home in improved condition.      ____________________________________________   FINAL CLINICAL IMPRESSION(S) / ED DIAGNOSES  Final diagnoses:  Fall, initial encounter  Laceration of scalp, initial encounter  Urinary tract infection without hematuria, site unspecified      NEW MEDICATIONS STARTED DURING THIS VISIT:  Discharge Medication List as of 06/23/2017  7:52 AM    START taking these medications   Details  ciprofloxacin (CIPRO) 500 MG tablet Take 1 tablet (500 mg total) by mouth 2 (two) times daily for 5 days., Starting Tue 06/23/2017, Until Sun 06/28/2017, Print         Note:  This document was prepared using Dragon voice recognition software and may include unintentional dictation errors.     Merrily Brittle, MD 06/26/17 [phone]

## 2017-06-23 NOTE — Discharge Instructions (Signed)
Today I placed a total of 4 staples in her scalp which need to come out in 10-14 days.  Please take all of your antibiotics as prescribed and follow-up with your primary care physician in 10-14 days.  Return to the emergency department for any concerns.  It was a pleasure to take care of you today, and thank you for coming to our emergency department.  If you have any questions or concerns before leaving please ask the nurse to grab me and I'm more than happy to go through your aftercare instructions again.  If you were prescribed any opioid pain medication today such as Norco, Vicodin, Percocet, morphine, hydrocodone, or oxycodone please make sure you do not drive when you are taking this medication as it can alter your ability to drive safely.  If you have any concerns once you are home that you are not improving or are in fact getting worse before you can make it to your follow-up appointment, please do not hesitate to call 911 and come back for further evaluation.  Merrily Brittle, MD  Results for orders placed or performed during the hospital encounter of 06/23/17  Urinalysis, Complete w Microscopic  Result Value Ref Range   Color, Urine YELLOW (A) YELLOW   APPearance HAZY (A) CLEAR   Specific Gravity, Urine 1.012 1.005 - 1.030   pH 6.0 5.0 - 8.0   Glucose, UA NEGATIVE NEGATIVE mg/dL   Hgb urine dipstick NEGATIVE NEGATIVE   Bilirubin Urine NEGATIVE NEGATIVE   Ketones, ur NEGATIVE NEGATIVE mg/dL   Protein, ur NEGATIVE NEGATIVE mg/dL   Nitrite POSITIVE (A) NEGATIVE   Leukocytes, UA LARGE (A) NEGATIVE   RBC / HPF 0-5 0 - 5 RBC/hpf   WBC, UA TOO NUMEROUS TO COUNT 0 - 5 WBC/hpf   Bacteria, UA FEW (A) NONE SEEN   Squamous Epithelial / LPF NONE SEEN NONE SEEN   WBC Clumps PRESENT   Comprehensive metabolic panel  Result Value Ref Range   Sodium 140 135 - 145 mmol/L   Potassium 3.6 3.5 - 5.1 mmol/L   Chloride 106 101 - 111 mmol/L   CO2 25 22 - 32 mmol/L   Glucose, Bld 165 (H) 65 - 99  mg/dL   BUN 15 6 - 20 mg/dL   Creatinine, Ser 1.30 0.44 - 1.00 mg/dL   Calcium 8.8 (L) 8.9 - 10.3 mg/dL   Total Protein 6.8 6.5 - 8.1 g/dL   Albumin 3.7 3.5 - 5.0 g/dL   AST 25 15 - 41 U/L   ALT 17 14 - 54 U/L   Alkaline Phosphatase 86 38 - 126 U/L   Total Bilirubin 0.8 0.3 - 1.2 mg/dL   GFR calc non Af Amer >60 >60 mL/min   GFR calc Af Amer >60 >60 mL/min   Anion gap 9 5 - 15  Troponin I  Result Value Ref Range   Troponin I <0.03 <0.03 ng/mL  CBC with Differential  Result Value Ref Range   WBC 6.7 3.6 - 11.0 K/uL   RBC 4.22 3.80 - 5.20 MIL/uL   Hemoglobin 13.5 12.0 - 16.0 g/dL   HCT 86.5 78.4 - 69.6 %   MCV 95.7 80.0 - 100.0 fL   MCH 32.1 26.0 - 34.0 pg   MCHC 33.5 32.0 - 36.0 g/dL   RDW 29.5 28.4 - 13.2 %   Platelets 220 150 - 440 K/uL   Neutrophils Relative % 65 %   Neutro Abs 4.3 1.4 - 6.5 K/uL   Lymphocytes  Relative 22 %   Lymphs Abs 1.5 1.0 - 3.6 K/uL   Monocytes Relative 10 %   Monocytes Absolute 0.7 0.2 - 0.9 K/uL   Eosinophils Relative 2 %   Eosinophils Absolute 0.1 0 - 0.7 K/uL   Basophils Relative 1 %   Basophils Absolute 0.1 0 - 0.1 K/uL   Dg Chest 1 View  Result Date: 06/23/2017 CLINICAL DATA:  84 year old female with chest pain. EXAM: CHEST  1 VIEW COMPARISON:  Chest radiograph dated 07/01/2015 FINDINGS: Minimal left lung base atelectatic changes. No focal consolidation, pleural effusion, or pneumothorax. Stable cardiac silhouette. No acute osseous pathology. IMPRESSION: No active disease. Electronically Signed   By: Elgie CollardArash  Radparvar M.D.   On: 06/23/2017 05:50   Ct Head Wo Contrast  Result Date: 06/23/2017 CLINICAL DATA:  84 year old female with head trauma and ataxia. EXAM: CT HEAD WITHOUT CONTRAST CT CERVICAL SPINE WITHOUT CONTRAST TECHNIQUE: Multidetector CT imaging of the head and cervical spine was performed following the standard protocol without intravenous contrast. Multiplanar CT image reconstructions of the cervical spine were also generated.  COMPARISON:  Head CT dated 07/01/2015 FINDINGS: CT HEAD FINDINGS Brain: Advanced cortical atrophy primarily involving the frontal lobes. Moderate chronic microvascular ischemic changes noted. There is prominence of the CSF spaces over the frontal lobes similar to prior study likely related to volume loss or old hygroma. There is no acute intracranial hemorrhage. No mass effect or midline shift. No extra-axial fluid collection. Vascular: No hyperdense vessel or unexpected calcification. Skull: Normal. Negative for fracture or focal lesion. Sinuses/Orbits: No acute finding. Other: None CT CERVICAL SPINE FINDINGS Alignment: No acute subluxation. Skull base and vertebrae: No acute fracture.  Osteopenia. Soft tissues and spinal canal: No prevertebral fluid or swelling. No visible canal hematoma. Disc levels: Multilevel degenerative changes with endplate irregularity and disc space narrowing. Multilevel facet arthropathy. There is grade 1 C4-C5 anterolisthesis. There is multilevel neural foramina narrowing most prominent at C5-C6. Upper chest: A 1 cm right thyroid nodule. Other: None IMPRESSION: 1. No acute intracranial hemorrhage. 2. Advanced age-related atrophy and chronic microvascular ischemic changes. Prominence of the CSF spaces over the frontal lobes likely related to volume loss or chronic hematoma. 3. No acute/traumatic cervical spine pathology. 4. Degenerative changes of the spine. Electronically Signed   By: Elgie CollardArash  Radparvar M.D.   On: 06/23/2017 06:14   Ct Cervical Spine Wo Contrast  Result Date: 06/23/2017 CLINICAL DATA:  84 year old female with head trauma and ataxia. EXAM: CT HEAD WITHOUT CONTRAST CT CERVICAL SPINE WITHOUT CONTRAST TECHNIQUE: Multidetector CT imaging of the head and cervical spine was performed following the standard protocol without intravenous contrast. Multiplanar CT image reconstructions of the cervical spine were also generated. COMPARISON:  Head CT dated 07/01/2015 FINDINGS: CT  HEAD FINDINGS Brain: Advanced cortical atrophy primarily involving the frontal lobes. Moderate chronic microvascular ischemic changes noted. There is prominence of the CSF spaces over the frontal lobes similar to prior study likely related to volume loss or old hygroma. There is no acute intracranial hemorrhage. No mass effect or midline shift. No extra-axial fluid collection. Vascular: No hyperdense vessel or unexpected calcification. Skull: Normal. Negative for fracture or focal lesion. Sinuses/Orbits: No acute finding. Other: None CT CERVICAL SPINE FINDINGS Alignment: No acute subluxation. Skull base and vertebrae: No acute fracture.  Osteopenia. Soft tissues and spinal canal: No prevertebral fluid or swelling. No visible canal hematoma. Disc levels: Multilevel degenerative changes with endplate irregularity and disc space narrowing. Multilevel facet arthropathy. There is grade 1 C4-C5 anterolisthesis.  There is multilevel neural foramina narrowing most prominent at C5-C6. Upper chest: A 1 cm right thyroid nodule. Other: None IMPRESSION: 1. No acute intracranial hemorrhage. 2. Advanced age-related atrophy and chronic microvascular ischemic changes. Prominence of the CSF spaces over the frontal lobes likely related to volume loss or chronic hematoma. 3. No acute/traumatic cervical spine pathology. 4. Degenerative changes of the spine. Electronically Signed   By: Elgie Collard M.D.   On: 06/23/2017 06:14

## 2017-06-23 NOTE — ED Notes (Signed)
Junius CreamerKaren Patterson [facility] to pick up pt for discharge, she verbalizes d/c understanding and follo wup.

## 2017-06-25 LAB — URINE CULTURE

## 2017-06-26 NOTE — Progress Notes (Signed)
ED Antimicrobial Stewardship Positive Culture Follow Up   Catherine Meyers is an 84 y.o. female who [facility] with a chief complaint of  Chief Complaint  Patient presents with  . Fall    Recent Results (from the past 720 hour(s))  Urine culture     Status: Abnormal   Collection Time: 06/23/17  5:36 AM  Result Value Ref Range Status   Specimen Description   Final    URINE, RANDOM Performed at [facility], [address] [place], [place] [address]    Special Requests   Final    NONE Performed at [facility], [address] [place], [place] [address]    Culture >=100,000 COLONIES/mL ESCHERICHIA COLI (A)  Final   Report Status 06/25/2017 FINAL  Final   Organism ID, Bacteria ESCHERICHIA COLI (A)  Final      Susceptibility   Escherichia coli - MIC*    AMPICILLIN <=2 SENSITIVE Sensitive     CEFAZOLIN <=4 SENSITIVE Sensitive     CEFTRIAXONE <=1 SENSITIVE Sensitive     CIPROFLOXACIN >=4 RESISTANT Resistant     GENTAMICIN <=1 SENSITIVE Sensitive     IMIPENEM <=0.25 SENSITIVE Sensitive     NITROFURANTOIN <=16 SENSITIVE Sensitive     TRIMETH/SULFA >=320 RESISTANT Resistant     AMPICILLIN/SULBACTAM <=2 SENSITIVE Sensitive     PIP/TAZO <=4 SENSITIVE Sensitive     Extended ESBL NEGATIVE Sensitive     * >=100,000 COLONIES/mL ESCHERICHIA COLI    []  Treated with cipro, organism resistant to prescribed antimicrobial []  Patient discharged originally without antimicrobial agent and treatment is now indicated  New antibiotic prescription: macrobid 100mg  po bid x 7 days   ED Provider: Dr Norberta KeensPaduchowski   Called into [facility] in [place]    ScipioGarrett Kassey Laforest 06/26/2017, 2:53 PM  Luan PullingGarrett Aundria Bitterman, PharmD, MBA, BCGP Clinical Pharmacist [facility]

## 2017-06-30 DIAGNOSIS — S0101XD Laceration without foreign body of scalp, subsequent encounter: Secondary | ICD-10-CM | POA: Diagnosis not present

## 2017-07-01 DIAGNOSIS — G4700 Insomnia, unspecified: Secondary | ICD-10-CM | POA: Diagnosis not present

## 2017-07-01 DIAGNOSIS — F05 Delirium due to known physiological condition: Secondary | ICD-10-CM | POA: Diagnosis not present

## 2017-07-01 DIAGNOSIS — F0150 Vascular dementia without behavioral disturbance: Secondary | ICD-10-CM | POA: Diagnosis not present

## 2017-07-02 DIAGNOSIS — E119 Type 2 diabetes mellitus without complications: Secondary | ICD-10-CM | POA: Diagnosis not present

## 2017-07-02 DIAGNOSIS — M6281 Muscle weakness (generalized): Secondary | ICD-10-CM | POA: Diagnosis not present

## 2017-07-02 DIAGNOSIS — E559 Vitamin D deficiency, unspecified: Secondary | ICD-10-CM | POA: Diagnosis not present

## 2017-07-02 DIAGNOSIS — E038 Other specified hypothyroidism: Secondary | ICD-10-CM | POA: Diagnosis not present

## 2017-07-02 DIAGNOSIS — S0101XD Laceration without foreign body of scalp, subsequent encounter: Secondary | ICD-10-CM | POA: Diagnosis not present

## 2017-07-02 DIAGNOSIS — M1611 Unilateral primary osteoarthritis, right hip: Secondary | ICD-10-CM | POA: Diagnosis not present

## 2017-07-02 DIAGNOSIS — K219 Gastro-esophageal reflux disease without esophagitis: Secondary | ICD-10-CM | POA: Diagnosis not present

## 2017-07-02 DIAGNOSIS — E7849 Other hyperlipidemia: Secondary | ICD-10-CM | POA: Diagnosis not present

## 2017-07-02 DIAGNOSIS — F419 Anxiety disorder, unspecified: Secondary | ICD-10-CM | POA: Diagnosis not present

## 2017-07-02 DIAGNOSIS — F329 Major depressive disorder, single episode, unspecified: Secondary | ICD-10-CM | POA: Diagnosis not present

## 2017-07-02 DIAGNOSIS — F0150 Vascular dementia without behavioral disturbance: Secondary | ICD-10-CM | POA: Diagnosis not present

## 2017-07-02 DIAGNOSIS — I679 Cerebrovascular disease, unspecified: Secondary | ICD-10-CM | POA: Diagnosis not present

## 2017-07-02 DIAGNOSIS — Z79899 Other long term (current) drug therapy: Secondary | ICD-10-CM | POA: Diagnosis not present

## 2017-07-02 DIAGNOSIS — M810 Age-related osteoporosis without current pathological fracture: Secondary | ICD-10-CM | POA: Diagnosis not present

## 2017-07-02 DIAGNOSIS — D518 Other vitamin B12 deficiency anemias: Secondary | ICD-10-CM | POA: Diagnosis not present

## 2017-07-02 DIAGNOSIS — N390 Urinary tract infection, site not specified: Secondary | ICD-10-CM | POA: Diagnosis not present

## 2017-07-02 DIAGNOSIS — M1712 Unilateral primary osteoarthritis, left knee: Secondary | ICD-10-CM | POA: Diagnosis not present

## 2017-07-02 DIAGNOSIS — Z9181 History of falling: Secondary | ICD-10-CM | POA: Diagnosis not present

## 2017-07-07 DIAGNOSIS — I679 Cerebrovascular disease, unspecified: Secondary | ICD-10-CM | POA: Diagnosis not present

## 2017-07-07 DIAGNOSIS — M6281 Muscle weakness (generalized): Secondary | ICD-10-CM | POA: Diagnosis not present

## 2017-07-07 DIAGNOSIS — S0101XD Laceration without foreign body of scalp, subsequent encounter: Secondary | ICD-10-CM | POA: Diagnosis not present

## 2017-07-07 DIAGNOSIS — N390 Urinary tract infection, site not specified: Secondary | ICD-10-CM | POA: Diagnosis not present

## 2017-07-07 DIAGNOSIS — F0150 Vascular dementia without behavioral disturbance: Secondary | ICD-10-CM | POA: Diagnosis not present

## 2017-07-07 DIAGNOSIS — M1712 Unilateral primary osteoarthritis, left knee: Secondary | ICD-10-CM | POA: Diagnosis not present

## 2017-07-09 DIAGNOSIS — N390 Urinary tract infection, site not specified: Secondary | ICD-10-CM | POA: Diagnosis not present

## 2017-07-09 DIAGNOSIS — M1712 Unilateral primary osteoarthritis, left knee: Secondary | ICD-10-CM | POA: Diagnosis not present

## 2017-07-09 DIAGNOSIS — F0150 Vascular dementia without behavioral disturbance: Secondary | ICD-10-CM | POA: Diagnosis not present

## 2017-07-09 DIAGNOSIS — S0101XD Laceration without foreign body of scalp, subsequent encounter: Secondary | ICD-10-CM | POA: Diagnosis not present

## 2017-07-09 DIAGNOSIS — I679 Cerebrovascular disease, unspecified: Secondary | ICD-10-CM | POA: Diagnosis not present

## 2017-07-09 DIAGNOSIS — M6281 Muscle weakness (generalized): Secondary | ICD-10-CM | POA: Diagnosis not present

## 2017-07-10 DIAGNOSIS — F0150 Vascular dementia without behavioral disturbance: Secondary | ICD-10-CM | POA: Diagnosis not present

## 2017-07-10 DIAGNOSIS — M6281 Muscle weakness (generalized): Secondary | ICD-10-CM | POA: Diagnosis not present

## 2017-07-10 DIAGNOSIS — M1712 Unilateral primary osteoarthritis, left knee: Secondary | ICD-10-CM | POA: Diagnosis not present

## 2017-07-10 DIAGNOSIS — I679 Cerebrovascular disease, unspecified: Secondary | ICD-10-CM | POA: Diagnosis not present

## 2017-07-10 DIAGNOSIS — N390 Urinary tract infection, site not specified: Secondary | ICD-10-CM | POA: Diagnosis not present

## 2017-07-10 DIAGNOSIS — S0101XD Laceration without foreign body of scalp, subsequent encounter: Secondary | ICD-10-CM | POA: Diagnosis not present

## 2017-07-14 DIAGNOSIS — I679 Cerebrovascular disease, unspecified: Secondary | ICD-10-CM | POA: Diagnosis not present

## 2017-07-14 DIAGNOSIS — M6281 Muscle weakness (generalized): Secondary | ICD-10-CM | POA: Diagnosis not present

## 2017-07-14 DIAGNOSIS — M1712 Unilateral primary osteoarthritis, left knee: Secondary | ICD-10-CM | POA: Diagnosis not present

## 2017-07-14 DIAGNOSIS — F0150 Vascular dementia without behavioral disturbance: Secondary | ICD-10-CM | POA: Diagnosis not present

## 2017-07-14 DIAGNOSIS — N390 Urinary tract infection, site not specified: Secondary | ICD-10-CM | POA: Diagnosis not present

## 2017-07-14 DIAGNOSIS — S0101XD Laceration without foreign body of scalp, subsequent encounter: Secondary | ICD-10-CM | POA: Diagnosis not present

## 2017-07-16 DIAGNOSIS — F0150 Vascular dementia without behavioral disturbance: Secondary | ICD-10-CM | POA: Diagnosis not present

## 2017-07-16 DIAGNOSIS — M1712 Unilateral primary osteoarthritis, left knee: Secondary | ICD-10-CM | POA: Diagnosis not present

## 2017-07-16 DIAGNOSIS — M6281 Muscle weakness (generalized): Secondary | ICD-10-CM | POA: Diagnosis not present

## 2017-07-16 DIAGNOSIS — S0101XD Laceration without foreign body of scalp, subsequent encounter: Secondary | ICD-10-CM | POA: Diagnosis not present

## 2017-07-16 DIAGNOSIS — I679 Cerebrovascular disease, unspecified: Secondary | ICD-10-CM | POA: Diagnosis not present

## 2017-07-16 DIAGNOSIS — N390 Urinary tract infection, site not specified: Secondary | ICD-10-CM | POA: Diagnosis not present

## 2017-07-17 DIAGNOSIS — F0150 Vascular dementia without behavioral disturbance: Secondary | ICD-10-CM | POA: Diagnosis not present

## 2017-07-17 DIAGNOSIS — I679 Cerebrovascular disease, unspecified: Secondary | ICD-10-CM | POA: Diagnosis not present

## 2017-07-17 DIAGNOSIS — M6281 Muscle weakness (generalized): Secondary | ICD-10-CM | POA: Diagnosis not present

## 2017-07-17 DIAGNOSIS — N390 Urinary tract infection, site not specified: Secondary | ICD-10-CM | POA: Diagnosis not present

## 2017-07-17 DIAGNOSIS — S0101XD Laceration without foreign body of scalp, subsequent encounter: Secondary | ICD-10-CM | POA: Diagnosis not present

## 2017-07-17 DIAGNOSIS — M1712 Unilateral primary osteoarthritis, left knee: Secondary | ICD-10-CM | POA: Diagnosis not present

## 2017-07-20 DIAGNOSIS — M1712 Unilateral primary osteoarthritis, left knee: Secondary | ICD-10-CM | POA: Diagnosis not present

## 2017-07-20 DIAGNOSIS — M6281 Muscle weakness (generalized): Secondary | ICD-10-CM | POA: Diagnosis not present

## 2017-07-20 DIAGNOSIS — N390 Urinary tract infection, site not specified: Secondary | ICD-10-CM | POA: Diagnosis not present

## 2017-07-20 DIAGNOSIS — S0101XD Laceration without foreign body of scalp, subsequent encounter: Secondary | ICD-10-CM | POA: Diagnosis not present

## 2017-07-20 DIAGNOSIS — I679 Cerebrovascular disease, unspecified: Secondary | ICD-10-CM | POA: Diagnosis not present

## 2017-07-20 DIAGNOSIS — F0150 Vascular dementia without behavioral disturbance: Secondary | ICD-10-CM | POA: Diagnosis not present

## 2017-07-22 DIAGNOSIS — S0101XD Laceration without foreign body of scalp, subsequent encounter: Secondary | ICD-10-CM | POA: Diagnosis not present

## 2017-07-22 DIAGNOSIS — I679 Cerebrovascular disease, unspecified: Secondary | ICD-10-CM | POA: Diagnosis not present

## 2017-07-22 DIAGNOSIS — F0150 Vascular dementia without behavioral disturbance: Secondary | ICD-10-CM | POA: Diagnosis not present

## 2017-07-22 DIAGNOSIS — N390 Urinary tract infection, site not specified: Secondary | ICD-10-CM | POA: Diagnosis not present

## 2017-07-22 DIAGNOSIS — M1712 Unilateral primary osteoarthritis, left knee: Secondary | ICD-10-CM | POA: Diagnosis not present

## 2017-07-22 DIAGNOSIS — M6281 Muscle weakness (generalized): Secondary | ICD-10-CM | POA: Diagnosis not present

## 2017-07-24 DIAGNOSIS — K219 Gastro-esophageal reflux disease without esophagitis: Secondary | ICD-10-CM | POA: Diagnosis not present

## 2017-07-24 DIAGNOSIS — E559 Vitamin D deficiency, unspecified: Secondary | ICD-10-CM | POA: Diagnosis not present

## 2017-07-24 DIAGNOSIS — M1990 Unspecified osteoarthritis, unspecified site: Secondary | ICD-10-CM | POA: Diagnosis not present

## 2017-07-24 DIAGNOSIS — F0150 Vascular dementia without behavioral disturbance: Secondary | ICD-10-CM | POA: Diagnosis not present

## 2017-07-28 DIAGNOSIS — M6281 Muscle weakness (generalized): Secondary | ICD-10-CM | POA: Diagnosis not present

## 2017-07-28 DIAGNOSIS — F0150 Vascular dementia without behavioral disturbance: Secondary | ICD-10-CM | POA: Diagnosis not present

## 2017-07-28 DIAGNOSIS — I679 Cerebrovascular disease, unspecified: Secondary | ICD-10-CM | POA: Diagnosis not present

## 2017-07-28 DIAGNOSIS — S0101XD Laceration without foreign body of scalp, subsequent encounter: Secondary | ICD-10-CM | POA: Diagnosis not present

## 2017-07-28 DIAGNOSIS — N390 Urinary tract infection, site not specified: Secondary | ICD-10-CM | POA: Diagnosis not present

## 2017-07-28 DIAGNOSIS — M1712 Unilateral primary osteoarthritis, left knee: Secondary | ICD-10-CM | POA: Diagnosis not present

## 2017-07-31 DIAGNOSIS — F0150 Vascular dementia without behavioral disturbance: Secondary | ICD-10-CM | POA: Diagnosis not present

## 2017-07-31 DIAGNOSIS — G4700 Insomnia, unspecified: Secondary | ICD-10-CM | POA: Diagnosis not present

## 2017-07-31 DIAGNOSIS — F22 Delusional disorders: Secondary | ICD-10-CM | POA: Diagnosis not present

## 2017-08-03 DIAGNOSIS — S0101XD Laceration without foreign body of scalp, subsequent encounter: Secondary | ICD-10-CM | POA: Diagnosis not present

## 2017-08-03 DIAGNOSIS — N390 Urinary tract infection, site not specified: Secondary | ICD-10-CM | POA: Diagnosis not present

## 2017-08-03 DIAGNOSIS — F0150 Vascular dementia without behavioral disturbance: Secondary | ICD-10-CM | POA: Diagnosis not present

## 2017-08-03 DIAGNOSIS — M6281 Muscle weakness (generalized): Secondary | ICD-10-CM | POA: Diagnosis not present

## 2017-08-03 DIAGNOSIS — M1712 Unilateral primary osteoarthritis, left knee: Secondary | ICD-10-CM | POA: Diagnosis not present

## 2017-08-03 DIAGNOSIS — I679 Cerebrovascular disease, unspecified: Secondary | ICD-10-CM | POA: Diagnosis not present

## 2017-08-04 DIAGNOSIS — N390 Urinary tract infection, site not specified: Secondary | ICD-10-CM | POA: Diagnosis not present

## 2017-08-04 DIAGNOSIS — F0150 Vascular dementia without behavioral disturbance: Secondary | ICD-10-CM | POA: Diagnosis not present

## 2017-08-04 DIAGNOSIS — I679 Cerebrovascular disease, unspecified: Secondary | ICD-10-CM | POA: Diagnosis not present

## 2017-08-04 DIAGNOSIS — M6281 Muscle weakness (generalized): Secondary | ICD-10-CM | POA: Diagnosis not present

## 2017-08-04 DIAGNOSIS — S0101XD Laceration without foreign body of scalp, subsequent encounter: Secondary | ICD-10-CM | POA: Diagnosis not present

## 2017-08-04 DIAGNOSIS — M1712 Unilateral primary osteoarthritis, left knee: Secondary | ICD-10-CM | POA: Diagnosis not present

## 2017-08-05 DIAGNOSIS — M1712 Unilateral primary osteoarthritis, left knee: Secondary | ICD-10-CM | POA: Diagnosis not present

## 2017-08-05 DIAGNOSIS — I679 Cerebrovascular disease, unspecified: Secondary | ICD-10-CM | POA: Diagnosis not present

## 2017-08-05 DIAGNOSIS — N390 Urinary tract infection, site not specified: Secondary | ICD-10-CM | POA: Diagnosis not present

## 2017-08-05 DIAGNOSIS — S0101XD Laceration without foreign body of scalp, subsequent encounter: Secondary | ICD-10-CM | POA: Diagnosis not present

## 2017-08-05 DIAGNOSIS — M6281 Muscle weakness (generalized): Secondary | ICD-10-CM | POA: Diagnosis not present

## 2017-08-05 DIAGNOSIS — F0150 Vascular dementia without behavioral disturbance: Secondary | ICD-10-CM | POA: Diagnosis not present

## 2017-08-10 DIAGNOSIS — F0150 Vascular dementia without behavioral disturbance: Secondary | ICD-10-CM | POA: Diagnosis not present

## 2017-08-10 DIAGNOSIS — M6281 Muscle weakness (generalized): Secondary | ICD-10-CM | POA: Diagnosis not present

## 2017-08-10 DIAGNOSIS — N390 Urinary tract infection, site not specified: Secondary | ICD-10-CM | POA: Diagnosis not present

## 2017-08-10 DIAGNOSIS — S0101XD Laceration without foreign body of scalp, subsequent encounter: Secondary | ICD-10-CM | POA: Diagnosis not present

## 2017-08-10 DIAGNOSIS — I679 Cerebrovascular disease, unspecified: Secondary | ICD-10-CM | POA: Diagnosis not present

## 2017-08-10 DIAGNOSIS — M1712 Unilateral primary osteoarthritis, left knee: Secondary | ICD-10-CM | POA: Diagnosis not present

## 2017-08-12 DIAGNOSIS — M1712 Unilateral primary osteoarthritis, left knee: Secondary | ICD-10-CM | POA: Diagnosis not present

## 2017-08-12 DIAGNOSIS — S0101XD Laceration without foreign body of scalp, subsequent encounter: Secondary | ICD-10-CM | POA: Diagnosis not present

## 2017-08-12 DIAGNOSIS — I679 Cerebrovascular disease, unspecified: Secondary | ICD-10-CM | POA: Diagnosis not present

## 2017-08-12 DIAGNOSIS — N390 Urinary tract infection, site not specified: Secondary | ICD-10-CM | POA: Diagnosis not present

## 2017-08-12 DIAGNOSIS — F0150 Vascular dementia without behavioral disturbance: Secondary | ICD-10-CM | POA: Diagnosis not present

## 2017-08-12 DIAGNOSIS — M6281 Muscle weakness (generalized): Secondary | ICD-10-CM | POA: Diagnosis not present

## 2017-08-14 ENCOUNTER — Telehealth: Payer: Self-pay

## 2017-08-14 NOTE — Telephone Encounter (Signed)
Pt has been scheduled for 09/21/2017 @ 10:30am. [facility] is aware of the pt's appt date and time.

## 2017-08-14 NOTE — Telephone Encounter (Signed)
Patient should be seen by MD once a year so she needs an appt with me . Last AWV was 2018, not 2019.

## 2017-08-14 NOTE — Telephone Encounter (Signed)
Will pt need to come in for a visit to have her FL2 forms updated? Pt was last seen in 06/2016.

## 2017-08-14 NOTE — Telephone Encounter (Signed)
Copied from CRM [id number]. Topic: Appointment Scheduling - Scheduling Inquiry for Clinic >> Aug 14, 2017 11:11 AM Crist Infante wrote: Reason for CRM: [facility] calling to request appt for pt this month for a check up and get FL2  signed and updated. Please call Josette or any other tech, they can all help:  at [phone]  >> Aug 14, 2017 11:28 AM Crist Infante wrote: Chanda Busing called back to ask if the pt even needs to see the doctor. Last seen 09/09/17 for an AWV.  Please advise .  Also, could this be done by doing an AWV as well?

## 2017-08-18 DIAGNOSIS — M6281 Muscle weakness (generalized): Secondary | ICD-10-CM | POA: Diagnosis not present

## 2017-08-18 DIAGNOSIS — N390 Urinary tract infection, site not specified: Secondary | ICD-10-CM | POA: Diagnosis not present

## 2017-08-18 DIAGNOSIS — S0101XD Laceration without foreign body of scalp, subsequent encounter: Secondary | ICD-10-CM | POA: Diagnosis not present

## 2017-08-18 DIAGNOSIS — I679 Cerebrovascular disease, unspecified: Secondary | ICD-10-CM | POA: Diagnosis not present

## 2017-08-18 DIAGNOSIS — F0150 Vascular dementia without behavioral disturbance: Secondary | ICD-10-CM | POA: Diagnosis not present

## 2017-08-18 DIAGNOSIS — M1712 Unilateral primary osteoarthritis, left knee: Secondary | ICD-10-CM | POA: Diagnosis not present

## 2017-08-25 DIAGNOSIS — E119 Type 2 diabetes mellitus without complications: Secondary | ICD-10-CM | POA: Diagnosis not present

## 2017-08-25 DIAGNOSIS — E038 Other specified hypothyroidism: Secondary | ICD-10-CM | POA: Diagnosis not present

## 2017-08-25 DIAGNOSIS — D518 Other vitamin B12 deficiency anemias: Secondary | ICD-10-CM | POA: Diagnosis not present

## 2017-09-21 ENCOUNTER — Encounter

## 2017-09-21 ENCOUNTER — Encounter: Payer: Self-pay | Admitting: Internal Medicine

## 2017-09-21 ENCOUNTER — Ambulatory Visit (INDEPENDENT_AMBULATORY_CARE_PROVIDER_SITE_OTHER): Payer: Medicare Other | Admitting: Internal Medicine

## 2017-09-21 DIAGNOSIS — F0151 Vascular dementia with behavioral disturbance: Secondary | ICD-10-CM | POA: Diagnosis not present

## 2017-09-21 DIAGNOSIS — F329 Major depressive disorder, single episode, unspecified: Secondary | ICD-10-CM

## 2017-09-21 DIAGNOSIS — F0153 Vascular dementia, unspecified severity, with mood disturbance: Secondary | ICD-10-CM

## 2017-09-21 NOTE — Progress Notes (Signed)
Patient ID: Catherine Meyers, female    DOB: [birth year]  Age: 84 y.o. MRN: [id number]  The patient is here for annual follow  Up and  management of other chronic and acute problems.  Last seen one year ago,  Lives in a group home "[facility]" for patients with dementia. FL-2 form has been completed by facility and is brought with patinet today by her son.   Son states that recently the facility allowed patient to be evaluated by "[facility] [employer]" and the physician d/c'd her bedtime lorazepam, which was keeping her from wandering .    Catherine Meyers became agitated  On  the first night without the medication and had a fall, requring an ER visit for scalp laceration which required sutures.  The physician who discontinued the lorazepam has since then added trazodone, and there have been no subsequent reported incidents. .    Patient is now nonverbal.  Per son Catherine Meyers spends the day in the grou[place] watching TV. Catherine Meyers is using a walker to ambulate .  He has noted  Some cough noted with eating.  Catherine Meyers has lost  13 lbs since June 2018 due to due to decreased appetite.     The risk factors are reflected in the social history.  The roster of all physicians providing medical care to patient - is listed in the Snapshot section of the chart.  Activities of daily living:  The patient is NOT independent in all ADLs: Catherine Meyers requires assistance dressing, toileting, feeding as well as walking (uses a walker)   There is no risks for hepatitis, STDs or HIV. There is no   history of blood transfusion. They have no travel history to infectious disease endemic areas of the world.  The patient has not seen their dentist in the last six month. They have not seen their eye doctor in the last year.    They do not  have excessive sun exposure. Discussed the need for sun protection: hats, long sleeves and use of sunscreen if there is significant sun exposure.   Diet: the importance of a healthy diet is discussed. They do have a  healthy diet.  The benefits of regular aerobic exercise were discussed. Catherine Meyers has advanced dementia and leads a sedentary life.  Depression screen: there are  signs or vegative symptoms of depression- i change in appetite only..    Cognitive assessment: the patient's son  manages all their financial and personal affairs and is not actively engaged.    The following portions of the patient's history were reviewed and updated as appropriate: allergies, current medications, past family history, past medical history,  past surgical history, past social history  and problem list.  Visual acuity and hearing was not assessed   During the course of the visit , end of life objectives were discussed with her son.  A DNR order and a MOST form were both filled out by me and given to son to take back to the facility   CC: The encounter diagnosis was Vascular dementia with depressed mood.  History Catherine Meyers has a past medical history of Arthritis, Dementia, Dementia, Hepatitis, Hepatitis, water-borne (1965), Migraine, Osteopenia, and Vitamin D deficiency disease.   Catherine Meyers has a past surgical history that includes Abdominal hysterectomy (1986); Knee arthroscopy (2003); and Hip pinning, cannulated (Right, 03/18/2015).   Her family history includes Cancer in her sister.Catherine Meyers reports that Catherine Meyers has never smoked. Catherine Meyers has never used smokeless tobacco. Catherine Meyers reports that Catherine Meyers does not drink  alcohol or use drugs.  Outpatient Medications Prior to Visit  Medication Sig Dispense Refill  . acetaminophen (MAPAP) 500 MG tablet Take 1 tablet (500 mg total) by mouth every 6 (six) hours as needed. 60 tablet 11  . alum & mag hydroxide-simeth (MAALOX/MYLANTA) 200-200-20 MG/5ML suspension Take 15 mLs by mouth every 6 (six) hours as needed for indigestion or heartburn.    . bismuth subsalicylate (PEPTO BISMOL) 262 MG/15ML suspension Take 30 mLs by mouth every 6 (six) hours as needed.    . Cranberry Fruit 405 MG CAPS TAKE 1 CAPSULE BY MOUTH  TWICE DAILY FOR URINARY HEALTH 60 each 1  . docusate sodium (COLACE) 100 MG capsule Take 1 capsule (100 mg total) by mouth 2 (two) times daily. 10 capsule 0  . GNP ARTHRITIS PAIN RELIEF 650 MG CR tablet TAKE 1 TABLET BY MOUTH TWICE DAILY FOR DISCOMFORT 60 tablet 5  . guaiFENesin (ROBITUSSIN) 100 MG/5ML liquid Take 200 mg by mouth 3 (three) times daily as needed for cough.    . loperamide (IMODIUM) 2 MG capsule TAKE PER STANDING AS NEEDED ORDERS 15 capsule 0  . loratadine (CLARITIN) 10 MG tablet TAKE 1 TABLET BY MOUTH ONCE DAILY FOR ALLERGIES. 90 tablet 1  . magnesium hydroxide (MILK OF MAGNESIA) 400 MG/5ML suspension Take 15 mLs by mouth daily as needed for mild constipation.    . mirtazapine (REMERON) 15 MG tablet TK 1 T PO QHS FOR SLEEP  2  . omeprazole (PRILOSEC) 40 MG capsule TAKE 1 CAPSULE BY MOUTH ONCE DAILY FOR REFLUX 90 capsule 2  . traZODone (DESYREL) 50 MG tablet TK 1/2 T PO QHS FOR INSOMNIA  3  . Vitamin D, Ergocalciferol, (DRISDOL) 50000 units CAPS capsule TAKE 1 CAPSULE BY MOUTH EVERY 14 DAYS FOR SUPPLEMENT 6 capsule 3  . benzonatate (TESSALON) 200 MG capsule Take 1 capsule (200 mg total) by mouth 3 (three) times daily as needed for cough. (Patient not taking: Reported on 09/21/2017) 60 capsule 1  . LORazepam (ATIVAN) 0.5 MG tablet TAKE 1 TABLET BY MOUTH ONCE DAILY AT BEDTIME FOR ANXIETY (Patient not taking: Reported on 09/21/2017) 30 tablet 2  . QUEtiapine (SEROQUEL) 50 MG tablet TAKE 1 TABLET BY MOUTH ONCE DAILY AT BEDTIME FOR PSYCHOSIS (Patient not taking: Reported on 09/21/2017) 30 tablet 5   No facility-administered medications prior to visit.     Review of Systems   Per son,  There has been no reports of headache, fevers, malaise,  skin rash, eye pain, sinus congestion and sinus pain, sore throat, dysphagia,  hemoptysis ,dyspnea, wheezing, chest pain, palpitations, orthopnea, edema, abdominal pain, nausea, melena, diarrhea, constipation, flank pain, dysuria, hematuria, urinary   Frequency, nocturia, numbness, tingling, seizures,  Focal weakness, Loss of consciousness,  Tremor, insomnia, depression, anxiety, and suicidal ideation.      Objective:  BP 124/72 (BP Location: Left Arm, Patient Position: Sitting, Cuff Size: Normal)   Pulse 62   Temp 98.2 F (36.8 C) (Oral)   Resp 15   Ht 5\' 4"  (1.626 m)   Wt 134 lb (60.8 kg)   SpO2 95%   BMI 23.00 kg/m   Physical Exam   General appearance: alert, cooperative and appears stated age Head: Normocephalic, without obvious abnormality, atraumatic Eyes: conjunctivae/corneas clear. PERRL, EOM's intact. Fundi benign. Ears: normal TM's and external ear canals both ears Nose: Nares normal. Septum midline. Mucosa normal. No drainage or sinus tenderness. Throat: lips, mucosa, and tongue normal; teeth and gums normal Neck: no adenopathy, no carotid bruit,  no JVD, supple, symmetrical, trachea midline and thyroid not enlarged, symmetric, no tenderness/mass/nodules Lungs: clear to auscultation bilaterally Breasts: normal appearance, no masses or tenderness Heart: regular rate and rhythm, S1, S2 normal, no murmur, click, rub or gallop Abdomen: soft, non-tender; bowel sounds normal; no masses,  no organomegaly Extremities: extremities normal, atraumatic, no cyanosis or edema Pulses: 2+ and symmetric Skin: Skin color, texture, turgor normal. No rashes or lesions Neurologic: Catherine Meyers is MAE without assistance.     Assessment & Plan:   Problem List Items Addressed This Visit    Vascular dementia with depressed mood    Progressive,  Now some signs suggestive of recurrent aspiration when eating.  Continue mitrazapine and trazodone.  End of life issues reviewed  with son at length; DNR again signed,  Along with  MOST orders signed.       Relevant Medications   traZODone (DESYREL) 50 MG tablet   mirtazapine (REMERON) 15 MG tablet      I have discontinued Danella SensingFay R. Swift's benzonatate, LORazepam, and QUEtiapine. I am also having her  maintain her guaiFENesin, bismuth subsalicylate, alum & mag hydroxide-simeth, magnesium hydroxide, docusate sodium, acetaminophen, Vitamin D (Ergocalciferol), loratadine, Cranberry Fruit, omeprazole, loperamide, GNP ARTHRITIS PAIN RELIEF, traZODone, and mirtazapine.  No orders of the defined types were placed in this encounter.   Medications Discontinued During This Encounter  Medication Reason  . benzonatate (TESSALON) 200 MG capsule Patient has not taken in last 30 days  . LORazepam (ATIVAN) 0.5 MG tablet Patient has not taken in last 30 days  . QUEtiapine (SEROQUEL) 50 MG tablet Patient has not taken in last 30 days    Follow-up: Return in about 1 year (around 09/22/2018).   Sherlene Shamseresa L Jasim Harari, MD

## 2017-09-22 ENCOUNTER — Encounter: Payer: Self-pay | Admitting: Internal Medicine

## 2017-09-22 DIAGNOSIS — E119 Type 2 diabetes mellitus without complications: Secondary | ICD-10-CM | POA: Diagnosis not present

## 2017-09-22 DIAGNOSIS — D518 Other vitamin B12 deficiency anemias: Secondary | ICD-10-CM | POA: Diagnosis not present

## 2017-09-22 DIAGNOSIS — E038 Other specified hypothyroidism: Secondary | ICD-10-CM | POA: Diagnosis not present

## 2017-09-22 NOTE — Assessment & Plan Note (Addendum)
Progressive,  Now some signs suggestive of recurrent aspiration when eating.  Continue mitrazapine and trazodone.  End of life issues reviewed  with son at length; DNR again signed,  Along with  MOST orders signed.

## 2017-10-06 ENCOUNTER — Telehealth: Payer: Self-pay | Admitting: Internal Medicine

## 2017-10-06 DIAGNOSIS — Z0279 Encounter for issue of other medical certificate: Secondary | ICD-10-CM

## 2017-10-06 NOTE — Telephone Encounter (Signed)
Personnal care plan was dropped off. Please have [name] complete and fax back. Paper work is up front in Dr. Melina Schoolsullo's color folder.

## 2017-10-06 NOTE — Telephone Encounter (Signed)
Form has been faxed back to number provided on form.

## 2017-10-06 NOTE — Telephone Encounter (Signed)
fyi

## 2017-10-08 ENCOUNTER — Emergency Department: Payer: Medicare Other

## 2017-10-08 ENCOUNTER — Emergency Department
Admission: EM | Admit: 2017-10-08 | Discharge: 2017-10-08 | Disposition: A | Discharge status: Home / Self Care | Payer: Medicare Other | Attending: Emergency Medicine | Admitting: Emergency Medicine

## 2017-10-08 DIAGNOSIS — S065X9A Traumatic subdural hemorrhage with loss of consciousness of unspecified duration, initial encounter: Secondary | ICD-10-CM

## 2017-10-08 DIAGNOSIS — S065X0A Traumatic subdural hemorrhage without loss of consciousness, initial encounter: Secondary | ICD-10-CM | POA: Diagnosis not present

## 2017-10-08 DIAGNOSIS — Z79899 Other long term (current) drug therapy: Secondary | ICD-10-CM | POA: Diagnosis not present

## 2017-10-08 DIAGNOSIS — S065XAA Traumatic subdural hemorrhage with loss of consciousness status unknown, initial encounter: Secondary | ICD-10-CM

## 2017-10-08 DIAGNOSIS — W19XXXA Unspecified fall, initial encounter: Secondary | ICD-10-CM

## 2017-10-08 DIAGNOSIS — Z23 Encounter for immunization: Secondary | ICD-10-CM | POA: Insufficient documentation

## 2017-10-08 DIAGNOSIS — S0121XA Laceration without foreign body of nose, initial encounter: Secondary | ICD-10-CM | POA: Insufficient documentation

## 2017-10-08 DIAGNOSIS — Y92012 Bathroom of single-family (private) house as the place of occurrence of the external cause: Secondary | ICD-10-CM | POA: Insufficient documentation

## 2017-10-08 DIAGNOSIS — Y9389 Activity, other specified: Secondary | ICD-10-CM | POA: Diagnosis not present

## 2017-10-08 DIAGNOSIS — S0181XA Laceration without foreign body of other part of head, initial encounter: Secondary | ICD-10-CM

## 2017-10-08 DIAGNOSIS — S0990XA Unspecified injury of head, initial encounter: Secondary | ICD-10-CM | POA: Diagnosis present

## 2017-10-08 DIAGNOSIS — S01511A Laceration without foreign body of lip, initial encounter: Secondary | ICD-10-CM | POA: Insufficient documentation

## 2017-10-08 DIAGNOSIS — S199XXA Unspecified injury of neck, initial encounter: Secondary | ICD-10-CM | POA: Diagnosis not present

## 2017-10-08 DIAGNOSIS — I6789 Other cerebrovascular disease: Secondary | ICD-10-CM | POA: Diagnosis not present

## 2017-10-08 DIAGNOSIS — W01198A Fall on same level from slipping, tripping and stumbling with subsequent striking against other object, initial encounter: Secondary | ICD-10-CM | POA: Diagnosis not present

## 2017-10-08 DIAGNOSIS — S0993XA Unspecified injury of face, initial encounter: Secondary | ICD-10-CM | POA: Diagnosis not present

## 2017-10-08 DIAGNOSIS — Y999 Unspecified external cause status: Secondary | ICD-10-CM | POA: Insufficient documentation

## 2017-10-08 DIAGNOSIS — F0151 Vascular dementia with behavioral disturbance: Secondary | ICD-10-CM | POA: Insufficient documentation

## 2017-10-08 MED ORDER — CHLORHEXIDINE GLUCONATE 0.12 % MT SOLN: 15.0000 mL | Freq: Two times a day (BID) | OROMUCOSAL | 0 refills | Status: AC

## 2017-10-08 MED ORDER — TETANUS-DIPHTH-ACELL PERTUSSIS 5-2.5-18.5 LF-MCG/0.5 IM SUSP
INTRAMUSCULAR | Status: AC
  Administered 2017-10-08: 0.5 mL via INTRAMUSCULAR
  Filled 2017-10-08: qty 0.5

## 2017-10-08 MED ORDER — TETANUS-DIPHTH-ACELL PERTUSSIS 5-2.5-18.5 LF-MCG/0.5 IM SUSP
0.5000 mL | Freq: Once | INTRAMUSCULAR | Status: AC
  Administered 2017-10-08: 0.5 mL via INTRAMUSCULAR

## 2017-10-08 MED ORDER — LIDOCAINE HCL (PF) 1 % IJ SOLN
5.0000 mL | Freq: Once | INTRAMUSCULAR | Status: AC
  Administered 2017-10-08: 5 mL via INTRADERMAL
  Filled 2017-10-08: qty 5

## 2017-10-08 NOTE — ED Provider Notes (Signed)
[facility] Emergency Department Provider Note  ____________________________________________  Time seen: Approximately 1:44 PM  I have reviewed the triage vital signs and the nursing notes.   HISTORY  Chief Complaint Fall  Level 5 Caveat: Portions of the History and Physical including HPI and review of systems are unable to be completely obtained due to patient being a poor historian from chronic dementia   HPI Catherine Meyers is a 84 y.o. female with a history of dementia and recurrent falls who was walking to the bathroom this morning with her walker, with assistance when she tripped and fell forward hitting her face on the floor.  No loss of consciousness.  Denies neck pain.  Has bleeding from the lip and nose initially.  Had otherwise been in her usual state of health, eating normally.  No apparent pain complaints.      Past Medical History:  Diagnosis Date  . Arthritis    left knee  . Dementia   . Dementia   . Hepatitis   . Hepatitis, water-borne 1965  . Migraine   . Osteopenia   . Vitamin D deficiency disease      Patient Active Problem List   Diagnosis Date Noted  . Dysphagia 07/21/2016  . Fracture of fifth metacarpal bone of right hand 08/09/2015  . Osteoarthritis of right hip 03/17/2015  . Recurrent falls while walking 11/05/2014  . Do not resuscitate status 11/05/2014  . Dementia with behavioral disturbance   . Sprain of ankle 11/04/2013  . Osteopenia of the elderly 11/04/2013  . Encounter for preventive health examination 06/02/2013  . Vascular dementia with depressed mood 05/30/2012  . Vitamin D deficiency 05/05/2012  . Depression 05/04/2012     Past Surgical History:  Procedure Laterality Date  . ABDOMINAL HYSTERECTOMY  1986  . HIP PINNING,CANNULATED Right 03/18/2015   Procedure: CANNULATED HIP PINNING;  Surgeon: Kennedy Bucker, MD;  Location: [facility] ORS;  Service: Orthopedics;  Laterality: Right;  . KNEE ARTHROSCOPY  2003      Prior to Admission medications   Medication Sig Start Date End Date Taking? Authorizing Provider  acetaminophen (MAPAP) 500 MG tablet Take 1 tablet (500 mg total) by mouth every 6 (six) hours as needed. 06/12/15   Sherlene Shams, MD  alum & mag hydroxide-simeth (MAALOX/MYLANTA) 200-200-20 MG/5ML suspension Take 15 mLs by mouth every 6 (six) hours as needed for indigestion or heartburn.    [provider]  bismuth subsalicylate (PEPTO BISMOL) 262 MG/15ML suspension Take 30 mLs by mouth every 6 (six) hours as needed.    [provider]  chlorhexidine (PERIDEX) 0.12 % solution Use as directed 15 mLs in the mouth or throat 2 (two) times daily for 7 days. Soak a small piece of gauze with Peridex and let dwell between the upper lip and upper front teeth for 5 minutes, two times daily. 10/08/17 10/15/17  Sharman Cheek, MD  Cranberry Fruit 405 MG CAPS TAKE 1 CAPSULE BY MOUTH TWICE DAILY FOR URINARY HEALTH 01/12/17   Sherlene Shams, MD  docusate sodium (COLACE) 100 MG capsule Take 1 capsule (100 mg total) by mouth 2 (two) times daily. 03/21/15   Enid Baas, MD  [facility] ARTHRITIS PAIN RELIEF 650 MG CR tablet TAKE 1 TABLET BY MOUTH TWICE DAILY FOR DISCOMFORT 04/23/17   Sherlene Shams, MD  guaiFENesin (ROBITUSSIN) 100 MG/5ML liquid Take 200 mg by mouth 3 (three) times daily as needed for cough.    [provider]  loperamide (IMODIUM) 2  MG capsule TAKE PER STANDING AS NEEDED ORDERS 03/13/17   Sherlene Shams, MD  loratadine (CLARITIN) 10 MG tablet TAKE 1 TABLET BY MOUTH ONCE DAILY FOR ALLERGIES. 12/02/16   Sherlene Shams, MD  magnesium hydroxide (MILK OF MAGNESIA) 400 MG/5ML suspension Take 15 mLs by mouth daily as needed for mild constipation.    [provider]  mirtazapine (REMERON) 15 MG tablet TK 1 T PO QHS FOR SLEEP 09/02/17   [provider]  omeprazole (PRILOSEC) 40 MG capsule TAKE 1 CAPSULE BY MOUTH ONCE DAILY FOR REFLUX 02/11/17   Sherlene Shams,  MD  traZODone (DESYREL) 50 MG tablet TK 1/2 T PO QHS FOR INSOMNIA 09/13/17   [provider]  Vitamin D, Ergocalciferol, (DRISDOL) 50000 units CAPS capsule TAKE 1 CAPSULE BY MOUTH EVERY 14 DAYS FOR SUPPLEMENT 11/17/16   Sherlene Shams, MD     Allergies Ampicillin and Cefaclor   Family History  Problem Relation Age of Onset  . Cancer Sister        breast    Social History Social History   Tobacco Use  . Smoking status: Never Smoker  . Smokeless tobacco: Never Used  Substance Use Topics  . Alcohol use: No  . Drug use: No     Review of Systems Level 5 Caveat: Portions of the History and Physical including HPI and review of systems are unable to be completely obtained due to patient being a poor historian ____________________________________________   PHYSICAL EXAM:  VITAL SIGNS: ED Triage Vitals [10/08/17 0915]  Enc Vitals Group     BP (!) 151/83     Pulse Rate 63     Resp 16     Temp 98.1 F (36.7 C)     Temp Source Oral     SpO2 97 %     Weight      Height      Head Circumference      Peak Flow      Pain Score      Pain Loc      Pain Edu?      Excl. in GC?     Vital signs reviewed, nursing assessments reviewed.   Constitutional:   Alert and oriented to self. Non-toxic appearance. Eyes:   Conjunctivae are normal. EOMI. PERRL. ENT      Head:   Normocephalic with 1 cm superficial laceration over the bridge of the nose, 3 cm irregular laceration over the midline upper lip, some bruising over the right maxilla..      Nose:   No congestion/rhinnorhea.  No epistaxis      Mouth/Throat:   MMM, no pharyngeal erythema.  No acute dental injury.  There is a laceration through the buccal surface of the upper lip extending to the external skin laceration.      Neck:   No meningismus. Full ROM.  No midline tenderness Hematological/Lymphatic/Immunilogical:   No cervical lymphadenopathy. Cardiovascular:   RRR. Symmetric bilateral radial and DP pulses.  No  murmurs.  Respiratory:   Normal respiratory effort without tachypnea/retractions. Breath sounds are clear and equal bilaterally. No wheezes/rales/rhonchi. Gastrointestinal:   Soft and nontender. Non distended. There is no CVA tenderness.  No rebound, rigidity, or guarding. Musculoskeletal:   Normal range of motion in all extremities. No joint effusions.  No lower extremity tenderness.  No edema. Neurologic:   Nonverbal Motor grossly intact. No acute focal neurologic deficits are appreciated.  Skin:    Skin is warm, dry and intact.  No rash noted.  No petechiae, purpura, or bullae.  ____________________________________________    LABS (pertinent positives/negatives) (all labs ordered are listed, but only abnormal results are displayed) Labs Reviewed - No data to display ____________________________________________   EKG    ____________________________________________    RADIOLOGY  Ct Head Wo Contrast  Result Date: 10/08/2017 CLINICAL DATA:  84 year old with dementia who fell earlier today sustaining a laceration to the UPPER lip and an abrasion to the nose. Multiple recent falls. Initial encounter. EXAM: CT HEAD WITHOUT CONTRAST CT MAXILLOFACIAL WITHOUT CONTRAST CT CERVICAL SPINE WITHOUT CONTRAST TECHNIQUE: Multidetector CT imaging of the head, cervical spine, and maxillofacial structures were performed using the standard protocol without intravenous contrast. Multiplanar CT image reconstructions of the cervical spine and maxillofacial structures were also generated. A metallic BB was placed on the right temple in order to reliably differentiate right from left. COMPARISON:  CT head and cervical spine 06/23/2017, 06/07/2015. CT head 07/01/2015. FINDINGS: CT HEAD FINDINGS Brain: Mixed attenuation subdural hematoma involving the RIGHT frontal convexity region, maximum thickness approximately 2.7 cm on the sagittal images, causing mass effect upon the RIGHT frontal lobe with effacement of  cortical sulci and slight shift of the midline to the LEFT of approximately 3 mm. No evidence of parenchymal, subarachnoid or intraventricular hemorrhage. Severe cortical, deep and cerebellar atrophy as noted previously. Moderate changes of small vessel disease of the white matter diffusely, unchanged. Vascular: Severe BILATERAL carotid siphon atherosclerosis. No hyperdense vessel. Skull: No skull fracture or other focal osseous abnormality involving the skull. Other: None. CT MAXILLOFACIAL FINDINGS Osseous: No fractures identified involving the facial bones. Temporomandibular joints anatomically aligned with mild degenerative changes. Orbits: No orbital fractures.  No evidence of intraorbital hematoma. Sinuses: Air-fluid level in the LEFT maxillary sinus. Opacification of adjacent POSTERIOR RIGHT ethmoid air cells. Remaining paranasal sinuses, BILATERAL mastoid air cells and BILATERAL middle ear cavities well-aerated. Soft tissues: No visible soft tissue hematoma. CT CERVICAL SPINE FINDINGS Alignment: Anatomic POSTERIOR alignment. Skull base and vertebrae: No fractures identified involving the cervical spine. Diffuse BILATERAL facet degenerative changes without evidence of perched facets. Coronal reformatted images demonstrate an intact craniocervical junction, intact dens and intact lateral masses throughout. Small calcification ANTERIOR to the dens. Soft tissues and spinal canal: No evidence of paraspinous or spinal canal hematoma. No evidence of spinal stenosis. Disc levels: Severe disc space narrowing and associated endplate hypertrophic changes at C5-6. Moderate disc space narrowing and associated endplate hypertrophic changes at C6-7. Remaining disc spaces well-preserved. Facet and uncinate hypertrophy account for multilevel foraminal stenoses including moderate BILATERAL C3-4, mild BILATERAL C4-5, severe BILATERAL C5-6 and severe BILATERAL C6-7. Upper chest: Aortic arch and proximal great vessel  atherosclerosis. 1 cm nodule involving the UPPER pole of the RIGHT lobe of the thyroid gland. Visualized superior mediastinum otherwise unremarkable. Visualized lung apices clear. Other: Mild atherosclerosis involving the LEFT common carotid artery at its bifurcation. IMPRESSION: CT Head: 1. Acute superimposed upon chronic (versus subacute) RIGHT frontal convexity subdural hematoma, maximum thickness approximately 2.7 cm. Associated mass effect and slight midline shift to the LEFT of approximately 3 mm. 2. Severe generalized atrophy and moderate chronic microvascular ischemic changes of the white matter. 3. No skull fractures. CT Maxillofacial: 1. No facial bone fractures identified. 2. Air-fluid level in the LEFT maxillary sinus, likely indicating mild acute sinusitis. CT Cervical Spine: 1. No cervical spine fractures identified. 2. Degenerative disc disease and spondylosis at C5-6 (severe) and C6-7 (moderate). Multilevel foraminal stenoses as detailed above. 3. 1 cm RIGHT lobe  thyroid nodule. This does not require further imaging evaluation. This follows ACR consensus guidelines: Managing Incidental Thyroid Nodules Detected on Imaging: White Paper of the ACR Incidental Thyroid Findings Committee. J Am Coll Radiol 2015; 12:143-150. I telephoned these results at the time of interpretation on 10/08/2017 at 10:32 am to Dr. Sharman Cheek, who verbally acknowledged these results. Electronically Signed   By: Hulan Saas M.D.   On: 10/08/2017 10:31   Ct Cervical Spine Wo Contrast  Result Date: 10/08/2017 CLINICAL DATA:  84 year old with dementia who fell earlier today sustaining a laceration to the UPPER lip and an abrasion to the nose. Multiple recent falls. Initial encounter. EXAM: CT HEAD WITHOUT CONTRAST CT MAXILLOFACIAL WITHOUT CONTRAST CT CERVICAL SPINE WITHOUT CONTRAST TECHNIQUE: Multidetector CT imaging of the head, cervical spine, and maxillofacial structures were performed using the standard protocol  without intravenous contrast. Multiplanar CT image reconstructions of the cervical spine and maxillofacial structures were also generated. A metallic BB was placed on the right temple in order to reliably differentiate right from left. COMPARISON:  CT head and cervical spine 06/23/2017, 06/07/2015. CT head 07/01/2015. FINDINGS: CT HEAD FINDINGS Brain: Mixed attenuation subdural hematoma involving the RIGHT frontal convexity region, maximum thickness approximately 2.7 cm on the sagittal images, causing mass effect upon the RIGHT frontal lobe with effacement of cortical sulci and slight shift of the midline to the LEFT of approximately 3 mm. No evidence of parenchymal, subarachnoid or intraventricular hemorrhage. Severe cortical, deep and cerebellar atrophy as noted previously. Moderate changes of small vessel disease of the white matter diffusely, unchanged. Vascular: Severe BILATERAL carotid siphon atherosclerosis. No hyperdense vessel. Skull: No skull fracture or other focal osseous abnormality involving the skull. Other: None. CT MAXILLOFACIAL FINDINGS Osseous: No fractures identified involving the facial bones. Temporomandibular joints anatomically aligned with mild degenerative changes. Orbits: No orbital fractures.  No evidence of intraorbital hematoma. Sinuses: Air-fluid level in the LEFT maxillary sinus. Opacification of adjacent POSTERIOR RIGHT ethmoid air cells. Remaining paranasal sinuses, BILATERAL mastoid air cells and BILATERAL middle ear cavities well-aerated. Soft tissues: No visible soft tissue hematoma. CT CERVICAL SPINE FINDINGS Alignment: Anatomic POSTERIOR alignment. Skull base and vertebrae: No fractures identified involving the cervical spine. Diffuse BILATERAL facet degenerative changes without evidence of perched facets. Coronal reformatted images demonstrate an intact craniocervical junction, intact dens and intact lateral masses throughout. Small calcification ANTERIOR to the dens. Soft  tissues and spinal canal: No evidence of paraspinous or spinal canal hematoma. No evidence of spinal stenosis. Disc levels: Severe disc space narrowing and associated endplate hypertrophic changes at C5-6. Moderate disc space narrowing and associated endplate hypertrophic changes at C6-7. Remaining disc spaces well-preserved. Facet and uncinate hypertrophy account for multilevel foraminal stenoses including moderate BILATERAL C3-4, mild BILATERAL C4-5, severe BILATERAL C5-6 and severe BILATERAL C6-7. Upper chest: Aortic arch and proximal great vessel atherosclerosis. 1 cm nodule involving the UPPER pole of the RIGHT lobe of the thyroid gland. Visualized superior mediastinum otherwise unremarkable. Visualized lung apices clear. Other: Mild atherosclerosis involving the LEFT common carotid artery at its bifurcation. IMPRESSION: CT Head: 1. Acute superimposed upon chronic (versus subacute) RIGHT frontal convexity subdural hematoma, maximum thickness approximately 2.7 cm. Associated mass effect and slight midline shift to the LEFT of approximately 3 mm. 2. Severe generalized atrophy and moderate chronic microvascular ischemic changes of the white matter. 3. No skull fractures. CT Maxillofacial: 1. No facial bone fractures identified. 2. Air-fluid level in the LEFT maxillary sinus, likely indicating mild acute sinusitis. CT Cervical Spine: 1.  No cervical spine fractures identified. 2. Degenerative disc disease and spondylosis at C5-6 (severe) and C6-7 (moderate). Multilevel foraminal stenoses as detailed above. 3. 1 cm RIGHT lobe thyroid nodule. This does not require further imaging evaluation. This follows ACR consensus guidelines: Managing Incidental Thyroid Nodules Detected on Imaging: White Paper of the ACR Incidental Thyroid Findings Committee. J Am Coll Radiol 2015; 12:143-150. I telephoned these results at the time of interpretation on 10/08/2017 at 10:32 am to Dr. Sharman Cheek, who verbally acknowledged these  results. Electronically Signed   By: Hulan Saas M.D.   On: 10/08/2017 10:31   Ct Maxillofacial Wo Contrast  Result Date: 10/08/2017 CLINICAL DATA:  85 year old with dementia who fell earlier today sustaining a laceration to the UPPER lip and an abrasion to the nose. Multiple recent falls. Initial encounter. EXAM: CT HEAD WITHOUT CONTRAST CT MAXILLOFACIAL WITHOUT CONTRAST CT CERVICAL SPINE WITHOUT CONTRAST TECHNIQUE: Multidetector CT imaging of the head, cervical spine, and maxillofacial structures were performed using the standard protocol without intravenous contrast. Multiplanar CT image reconstructions of the cervical spine and maxillofacial structures were also generated. A metallic BB was placed on the right temple in order to reliably differentiate right from left. COMPARISON:  CT head and cervical spine 06/23/2017, 06/07/2015. CT head 07/01/2015. FINDINGS: CT HEAD FINDINGS Brain: Mixed attenuation subdural hematoma involving the RIGHT frontal convexity region, maximum thickness approximately 2.7 cm on the sagittal images, causing mass effect upon the RIGHT frontal lobe with effacement of cortical sulci and slight shift of the midline to the LEFT of approximately 3 mm. No evidence of parenchymal, subarachnoid or intraventricular hemorrhage. Severe cortical, deep and cerebellar atrophy as noted previously. Moderate changes of small vessel disease of the white matter diffusely, unchanged. Vascular: Severe BILATERAL carotid siphon atherosclerosis. No hyperdense vessel. Skull: No skull fracture or other focal osseous abnormality involving the skull. Other: None. CT MAXILLOFACIAL FINDINGS Osseous: No fractures identified involving the facial bones. Temporomandibular joints anatomically aligned with mild degenerative changes. Orbits: No orbital fractures.  No evidence of intraorbital hematoma. Sinuses: Air-fluid level in the LEFT maxillary sinus. Opacification of adjacent POSTERIOR RIGHT ethmoid air cells.  Remaining paranasal sinuses, BILATERAL mastoid air cells and BILATERAL middle ear cavities well-aerated. Soft tissues: No visible soft tissue hematoma. CT CERVICAL SPINE FINDINGS Alignment: Anatomic POSTERIOR alignment. Skull base and vertebrae: No fractures identified involving the cervical spine. Diffuse BILATERAL facet degenerative changes without evidence of perched facets. Coronal reformatted images demonstrate an intact craniocervical junction, intact dens and intact lateral masses throughout. Small calcification ANTERIOR to the dens. Soft tissues and spinal canal: No evidence of paraspinous or spinal canal hematoma. No evidence of spinal stenosis. Disc levels: Severe disc space narrowing and associated endplate hypertrophic changes at C5-6. Moderate disc space narrowing and associated endplate hypertrophic changes at C6-7. Remaining disc spaces well-preserved. Facet and uncinate hypertrophy account for multilevel foraminal stenoses including moderate BILATERAL C3-4, mild BILATERAL C4-5, severe BILATERAL C5-6 and severe BILATERAL C6-7. Upper chest: Aortic arch and proximal great vessel atherosclerosis. 1 cm nodule involving the UPPER pole of the RIGHT lobe of the thyroid gland. Visualized superior mediastinum otherwise unremarkable. Visualized lung apices clear. Other: Mild atherosclerosis involving the LEFT common carotid artery at its bifurcation. IMPRESSION: CT Head: 1. Acute superimposed upon chronic (versus subacute) RIGHT frontal convexity subdural hematoma, maximum thickness approximately 2.7 cm. Associated mass effect and slight midline shift to the LEFT of approximately 3 mm. 2. Severe generalized atrophy and moderate chronic microvascular ischemic changes of the white matter. 3. No  skull fractures. CT Maxillofacial: 1. No facial bone fractures identified. 2. Air-fluid level in the LEFT maxillary sinus, likely indicating mild acute sinusitis. CT Cervical Spine: 1. No cervical spine fractures  identified. 2. Degenerative disc disease and spondylosis at C5-6 (severe) and C6-7 (moderate). Multilevel foraminal stenoses as detailed above. 3. 1 cm RIGHT lobe thyroid nodule. This does not require further imaging evaluation. This follows ACR consensus guidelines: Managing Incidental Thyroid Nodules Detected on Imaging: White Paper of the ACR Incidental Thyroid Findings Committee. J Am Coll Radiol 2015; 12:143-150. I telephoned these results at the time of interpretation on 10/08/2017 at 10:32 am to Dr. Sharman CheekPHILLIP Sallie Staron, who verbally acknowledged these results. Electronically Signed   By: Hulan Saashomas  Lawrence M.D.   On: 10/08/2017 10:31    ____________________________________________   PROCEDURES .Marland Kitchen.Laceration Repair Date/Time: 10/08/2017 1:58 PM Performed by: Sharman CheekStafford, Nitzia Perren, MD Authorized by: Sharman CheekStafford, Brownie Gockel, MD   Consent:    Consent obtained:  Verbal   Consent given by:  Patient and healthcare agent (Son at bedside)   Risks discussed:  Infection, poor cosmetic result, poor wound healing and retained foreign body   Alternatives discussed:  No treatment Anesthesia (see MAR for exact dosages):    Anesthesia method:  Local infiltration and nerve block   Local anesthetic:  Lidocaine 1% w/o epi   Block location:  Right infraorbital   Block needle gauge:  25 G   Block injection procedure:  Anatomic landmarks identified, introduced needle, anatomic landmarks palpated and negative aspiration for blood   Block outcome:  Anesthesia achieved Laceration details:    Location:  Face   Facial location: Frenulum area between lip and nose.   Length (cm):  3   Laceration depth: Full-thickness to mucosal surface of the mouth. Pre-procedure details:    Preparation:  Patient was prepped and draped in usual sterile fashion and imaging obtained to evaluate for foreign bodies Exploration:    Hemostasis achieved with:  Direct pressure   Wound exploration: entire depth of wound probed and visualized      Wound extent: no foreign bodies/material noted and no vascular damage noted     Contaminated: no   Treatment:    Area cleansed with:  Betadine   Amount of cleaning:  Standard   Irrigation solution:  Sterile saline   Irrigation method:  Pressure wash   Visualized foreign bodies/material removed: no   Mucous membrane repair:    Suture size:  4-0   Suture material:  Plain gut   Suture technique:  Simple interrupted (Partial repair, closing age of wound extends to the gingival margin.)   Number of sutures:  2 Skin repair:    Repair method:  Sutures   Suture size:  4-0   Suture material:  Plain gut   Number of sutures:  6 Approximation:    Approximation:  Close Post-procedure details:    Patient tolerance of procedure:  Tolerated well, no immediate complications    ____________________________________________  DIFFERENTIAL DIAGNOSIS   Facial fracture, intracranial hemorrhage, cervical spine fracture  CLINICAL IMPRESSION / ASSESSMENT AND PLAN / ED COURSE  Pertinent labs & imaging results that were available during my care of the patient were reviewed by me and considered in my medical decision making (see chart for details).    Patient presents after mechanical fall.  Check CT scans for traumatic injuries.  Update tetanus.  Plan to provide wound care.  Will need to leave buccal surface of the laceration open to avoid infection.  Plan for Peridex solution  as well.  Clinical Course as of Oct 09 1342  Thu Oct 08, 2017  1204 Imaging reviewed with radiologist.  I discussed these findings with the son at bedside who indicates that his mom would not want any kind of interventions.  He is agreeable to repair of her facial laceration.  She has a most form which indicates DNR, no IV fluids.  He agrees with discharge home after wound care.   [PS]    Clinical Course User Index [PS] Sharman Cheek, MD     ____________________________________________   FINAL CLINICAL IMPRESSION(S) /  ED DIAGNOSES    Final diagnoses:  Subdural hematoma (HCC)  Fall, initial encounter  Facial laceration, initial encounter     ED Discharge Orders        Ordered    chlorhexidine (PERIDEX) 0.12 % solution  2 times daily     10/08/17 1340      Portions of this note were generated with dragon dictation software. Dictation errors may occur despite best attempts at proofreading.    Sharman Cheek, MD 10/08/17 [phone]

## 2017-10-08 NOTE — ED Triage Notes (Signed)
Pt walking towards shower at [employer] and fell forward. Laceration to upper lip and abrasion to nose. Hx dementia. Falls frequently.

## 2017-10-08 NOTE — ED Notes (Signed)
Pt discharged back to facility with son, Irven EasterlyMichael Privitera.

## 2017-10-08 NOTE — ED Notes (Addendum)
Pt son at bedside.

## 2017-10-08 NOTE — Discharge Instructions (Addendum)
Your CT scan shows bleeding inside the brain, a combination of some new and some old bleeding.  This may worsen, potentially resulting in coma or death, but with her condition we agreed that aggressive surgical procedures would not be in her best interest.  Follow a liquid diet for the next week due to your open wound on the inside of the upper lip.  Apply Peridex solution soaked in gauze to the upper lip 2 times a day.   2 absorbable sutures were placed on the inside surface of the wound to partially close it.  4 absorbable sutures were placed on the external surface of the upper lip to repair the laceration.  Tissue adhesive was used to seal a small laceration over the nose.

## 2017-10-21 ENCOUNTER — Ambulatory Visit (INDEPENDENT_AMBULATORY_CARE_PROVIDER_SITE_OTHER): Payer: Medicare Other | Admitting: Internal Medicine

## 2017-10-21 ENCOUNTER — Encounter: Payer: Self-pay | Admitting: Internal Medicine

## 2017-10-21 DIAGNOSIS — S01511D Laceration without foreign body of lip, subsequent encounter: Secondary | ICD-10-CM | POA: Diagnosis not present

## 2017-10-21 DIAGNOSIS — R296 Repeated falls: Secondary | ICD-10-CM

## 2017-10-21 NOTE — Progress Notes (Signed)
Subjective:  Patient ID: Catherine Meyers, female    DOB: April 04, 1927  Age: 84 y.o. MRN: [id number]  CC: Diagnoses of Laceration of frenum of upper lip, subsequent encounter and Recurrent falls while walking were pertinent to this visit.  HPI Catherine Meyers presents for ER Follow up.   She was treated by EDP  2 weeks ago  For a laceration to her upper lip that occurred during a fall at the nursing home.  She is accompanied by her son today. Per son the caregiver reported that the fall occurred in the bathroom after patient slipped and fell.  The laceration required a total of 6 sutures, 2 of which were done on the inner surface of her lip.  She was told to follow up with PCP to have them removed.  Head and cervical spine CTs were done  during ER evaluation and no fractures or bleeds were seen.  Catherine Meyers is verbally non communicative  due to advanced vascular dementia, but awake and alert and smiling today. She has no brusing noted and son has not had any lethargy or unusual behavior reported by the nursing home staff.    Outpatient Medications Prior to Visit  Medication Sig Dispense Refill  . acetaminophen (MAPAP) 500 MG tablet Take 1 tablet (500 mg total) by mouth every 6 (six) hours as needed. 60 tablet 11  . alum & mag hydroxide-simeth (MAALOX/MYLANTA) 200-200-20 MG/5ML suspension Take 15 mLs by mouth every 6 (six) hours as needed for indigestion or heartburn.    . bismuth subsalicylate (PEPTO BISMOL) 262 MG/15ML suspension Take 30 mLs by mouth every 6 (six) hours as needed.    . Cranberry Fruit 405 MG CAPS TAKE 1 CAPSULE BY MOUTH TWICE DAILY FOR URINARY HEALTH 60 each 1  . GNP ARTHRITIS PAIN RELIEF 650 MG CR tablet TAKE 1 TABLET BY MOUTH TWICE DAILY FOR DISCOMFORT 60 tablet 5  . loratadine (CLARITIN) 10 MG tablet TAKE 1 TABLET BY MOUTH ONCE DAILY FOR ALLERGIES. 90 tablet 1  . magnesium hydroxide (MILK OF MAGNESIA) 400 MG/5ML suspension Take 15 mLs by mouth daily as needed for mild constipation.      . mirtazapine (REMERON) 15 MG tablet TK 1 T PO QHS FOR SLEEP  2  . omeprazole (PRILOSEC) 40 MG capsule TAKE 1 CAPSULE BY MOUTH ONCE DAILY FOR REFLUX 90 capsule 2  . traZODone (DESYREL) 50 MG tablet TK 1/2 T PO QHS FOR INSOMNIA  3  . Vitamin D, Ergocalciferol, (DRISDOL) 50000 units CAPS capsule TAKE 1 CAPSULE BY MOUTH EVERY 14 DAYS FOR SUPPLEMENT 6 capsule 3  . docusate sodium (COLACE) 100 MG capsule Take 1 capsule (100 mg total) by mouth 2 (two) times daily. (Patient not taking: Reported on 10/21/2017) 10 capsule 0  . guaiFENesin (ROBITUSSIN) 100 MG/5ML liquid Take 200 mg by mouth 3 (three) times daily as needed for cough.    . loperamide (IMODIUM) 2 MG capsule TAKE PER STANDING AS NEEDED ORDERS (Patient not taking: Reported on 10/21/2017) 15 capsule 0   No facility-administered medications prior to visit.     Review of Systems;  Per son,  There has been no report  Of headache, fevers, malaise, , skin rash,  dysphagia,  hemoptysis , cough, dyspnea, wheezing,orthopnea, edema, abdominal pain, nausea, melena, diarrhea, constipation,hematuria,  seizures,  Focal weakness, Loss of consciousness,  Tremor, insomnia, depression, or agitation ,.      Objective:  BP 124/68 (BP Location: Left Arm, Patient Position: Sitting, Cuff Size: Normal)  Pulse 71   Temp (!) 97.5 F (36.4 C) (Oral)   Resp 15   SpO2 97%   BP Readings from Last 3 Encounters:  10/21/17 124/68  10/08/17 (!) 142/74  09/21/17 124/72    Wt Readings from Last 3 Encounters:  09/21/17 134 lb (60.8 kg)  06/23/17 140 lb (63.5 kg)  09/09/16 147 lb 12.8 oz (67 kg)    General appearance: alert, cooperative and appears stated age Ears: normal TM's and external ear canals both ears Throat: lips, mucosa, and tongue normal; teeth and gums normal Neck: no adenopathy, no carotid bruit, supple, symmetrical, trachea midline and thyroid not enlarged, symmetric, no tenderness/mass/nodules Back: symmetric, no curvature. ROM normal. No CVA  tenderness. Lungs: clear to auscultation bilaterally Heart: regular rate and rhythm, S1, S2 normal, no murmur, click, rub or gallop Abdomen: soft, non-tender; bowel sounds normal; no masses,  no organomegaly Pulses: 2+ and symmetric Skin: Skin color, texture, turgor normal. No rashes or lesions Lymph nodes: Cervical, supraclavicular, and axillary nodes normal.  No results found for: HGBA1C  Lab Results  Component Value Date   CREATININE 0.83 06/23/2017   CREATININE 0.99 01/21/2016   CREATININE 0.97 03/20/2015    Lab Results  Component Value Date   WBC 6.7 06/23/2017   HGB 13.5 06/23/2017   HCT 40.4 06/23/2017   PLT 220 06/23/2017   GLUCOSE 165 (H) 06/23/2017   CHOL 176 05/04/2012   TRIG 95.0 05/04/2012   HDL 55.70 05/04/2012   LDLCALC 101 (H) 05/04/2012   ALT 17 06/23/2017   AST 25 06/23/2017   NA 140 06/23/2017   K 3.6 06/23/2017   CL 106 06/23/2017   CREATININE 0.83 06/23/2017   BUN 15 06/23/2017   CO2 25 06/23/2017   TSH 3.76 01/21/2016    Ct Head Wo Contrast  Result Date: 10/08/2017 CLINICAL DATA:  84 year old with dementia who fell earlier today sustaining a laceration to the UPPER lip and an abrasion to the nose. Multiple recent falls. Initial encounter. EXAM: CT HEAD WITHOUT CONTRAST CT MAXILLOFACIAL WITHOUT CONTRAST CT CERVICAL SPINE WITHOUT CONTRAST TECHNIQUE: Multidetector CT imaging of the head, cervical spine, and maxillofacial structures were performed using the standard protocol without intravenous contrast. Multiplanar CT image reconstructions of the cervical spine and maxillofacial structures were also generated. A metallic BB was placed on the right temple in order to reliably differentiate right from left. COMPARISON:  CT head and cervical spine 06/23/2017, 06/07/2015. CT head 07/01/2015. FINDINGS: CT HEAD FINDINGS Brain: Mixed attenuation subdural hematoma involving the RIGHT frontal convexity region, maximum thickness approximately 2.7 cm on the sagittal  images, causing mass effect upon the RIGHT frontal lobe with effacement of cortical sulci and slight shift of the midline to the LEFT of approximately 3 mm. No evidence of parenchymal, subarachnoid or intraventricular hemorrhage. Severe cortical, deep and cerebellar atrophy as noted previously. Moderate changes of small vessel disease of the white matter diffusely, unchanged. Vascular: Severe BILATERAL carotid siphon atherosclerosis. No hyperdense vessel. Skull: No skull fracture or other focal osseous abnormality involving the skull. Other: None. CT MAXILLOFACIAL FINDINGS Osseous: No fractures identified involving the facial bones. Temporomandibular joints anatomically aligned with mild degenerative changes. Orbits: No orbital fractures.  No evidence of intraorbital hematoma. Sinuses: Air-fluid level in the LEFT maxillary sinus. Opacification of adjacent POSTERIOR RIGHT ethmoid air cells. Remaining paranasal sinuses, BILATERAL mastoid air cells and BILATERAL middle ear cavities well-aerated. Soft tissues: No visible soft tissue hematoma. CT CERVICAL SPINE FINDINGS Alignment: Anatomic POSTERIOR alignment. Skull base and  vertebrae: No fractures identified involving the cervical spine. Diffuse BILATERAL facet degenerative changes without evidence of perched facets. Coronal reformatted images demonstrate an intact craniocervical junction, intact dens and intact lateral masses throughout. Small calcification ANTERIOR to the dens. Soft tissues and spinal canal: No evidence of paraspinous or spinal canal hematoma. No evidence of spinal stenosis. Disc levels: Severe disc space narrowing and associated endplate hypertrophic changes at C5-6. Moderate disc space narrowing and associated endplate hypertrophic changes at C6-7. Remaining disc spaces well-preserved. Facet and uncinate hypertrophy account for multilevel foraminal stenoses including moderate BILATERAL C3-4, mild BILATERAL C4-5, severe BILATERAL C5-6 and severe  BILATERAL C6-7. Upper chest: Aortic arch and proximal great vessel atherosclerosis. 1 cm nodule involving the UPPER pole of the RIGHT lobe of the thyroid gland. Visualized superior mediastinum otherwise unremarkable. Visualized lung apices clear. Other: Mild atherosclerosis involving the LEFT common carotid artery at its bifurcation. IMPRESSION: CT Head: 1. Acute superimposed upon chronic (versus subacute) RIGHT frontal convexity subdural hematoma, maximum thickness approximately 2.7 cm. Associated mass effect and slight midline shift to the LEFT of approximately 3 mm. 2. Severe generalized atrophy and moderate chronic microvascular ischemic changes of the white matter. 3. No skull fractures. CT Maxillofacial: 1. No facial bone fractures identified. 2. Air-fluid level in the LEFT maxillary sinus, likely indicating mild acute sinusitis. CT Cervical Spine: 1. No cervical spine fractures identified. 2. Degenerative disc disease and spondylosis at C5-6 (severe) and C6-7 (moderate). Multilevel foraminal stenoses as detailed above. 3. 1 cm RIGHT lobe thyroid nodule. This does not require further imaging evaluation. This follows ACR consensus guidelines: Managing Incidental Thyroid Nodules Detected on Imaging: White Paper of the ACR Incidental Thyroid Findings Committee. J Am Coll Radiol 2015; 12:143-150. I telephoned these results at the time of interpretation on 10/08/2017 at 10:32 am to Dr. Sharman Cheek, who verbally acknowledged these results. Electronically Signed   By: Hulan Saas M.D.   On: 10/08/2017 10:31   Ct Cervical Spine Wo Contrast  Result Date: 10/08/2017 CLINICAL DATA:  84 year old with dementia who fell earlier today sustaining a laceration to the UPPER lip and an abrasion to the nose. Multiple recent falls. Initial encounter. EXAM: CT HEAD WITHOUT CONTRAST CT MAXILLOFACIAL WITHOUT CONTRAST CT CERVICAL SPINE WITHOUT CONTRAST TECHNIQUE: Multidetector CT imaging of the head, cervical spine, and  maxillofacial structures were performed using the standard protocol without intravenous contrast. Multiplanar CT image reconstructions of the cervical spine and maxillofacial structures were also generated. A metallic BB was placed on the right temple in order to reliably differentiate right from left. COMPARISON:  CT head and cervical spine 06/23/2017, 06/07/2015. CT head 07/01/2015. FINDINGS: CT HEAD FINDINGS Brain: Mixed attenuation subdural hematoma involving the RIGHT frontal convexity region, maximum thickness approximately 2.7 cm on the sagittal images, causing mass effect upon the RIGHT frontal lobe with effacement of cortical sulci and slight shift of the midline to the LEFT of approximately 3 mm. No evidence of parenchymal, subarachnoid or intraventricular hemorrhage. Severe cortical, deep and cerebellar atrophy as noted previously. Moderate changes of small vessel disease of the white matter diffusely, unchanged. Vascular: Severe BILATERAL carotid siphon atherosclerosis. No hyperdense vessel. Skull: No skull fracture or other focal osseous abnormality involving the skull. Other: None. CT MAXILLOFACIAL FINDINGS Osseous: No fractures identified involving the facial bones. Temporomandibular joints anatomically aligned with mild degenerative changes. Orbits: No orbital fractures.  No evidence of intraorbital hematoma. Sinuses: Air-fluid level in the LEFT maxillary sinus. Opacification of adjacent POSTERIOR RIGHT ethmoid air cells. Remaining paranasal sinuses,  BILATERAL mastoid air cells and BILATERAL middle ear cavities well-aerated. Soft tissues: No visible soft tissue hematoma. CT CERVICAL SPINE FINDINGS Alignment: Anatomic POSTERIOR alignment. Skull base and vertebrae: No fractures identified involving the cervical spine. Diffuse BILATERAL facet degenerative changes without evidence of perched facets. Coronal reformatted images demonstrate an intact craniocervical junction, intact dens and intact lateral  masses throughout. Small calcification ANTERIOR to the dens. Soft tissues and spinal canal: No evidence of paraspinous or spinal canal hematoma. No evidence of spinal stenosis. Disc levels: Severe disc space narrowing and associated endplate hypertrophic changes at C5-6. Moderate disc space narrowing and associated endplate hypertrophic changes at C6-7. Remaining disc spaces well-preserved. Facet and uncinate hypertrophy account for multilevel foraminal stenoses including moderate BILATERAL C3-4, mild BILATERAL C4-5, severe BILATERAL C5-6 and severe BILATERAL C6-7. Upper chest: Aortic arch and proximal great vessel atherosclerosis. 1 cm nodule involving the UPPER pole of the RIGHT lobe of the thyroid gland. Visualized superior mediastinum otherwise unremarkable. Visualized lung apices clear. Other: Mild atherosclerosis involving the LEFT common carotid artery at its bifurcation. IMPRESSION: CT Head: 1. Acute superimposed upon chronic (versus subacute) RIGHT frontal convexity subdural hematoma, maximum thickness approximately 2.7 cm. Associated mass effect and slight midline shift to the LEFT of approximately 3 mm. 2. Severe generalized atrophy and moderate chronic microvascular ischemic changes of the white matter. 3. No skull fractures. CT Maxillofacial: 1. No facial bone fractures identified. 2. Air-fluid level in the LEFT maxillary sinus, likely indicating mild acute sinusitis. CT Cervical Spine: 1. No cervical spine fractures identified. 2. Degenerative disc disease and spondylosis at C5-6 (severe) and C6-7 (moderate). Multilevel foraminal stenoses as detailed above. 3. 1 cm RIGHT lobe thyroid nodule. This does not require further imaging evaluation. This follows ACR consensus guidelines: Managing Incidental Thyroid Nodules Detected on Imaging: White Paper of the ACR Incidental Thyroid Findings Committee. J Am Coll Radiol 2015; 12:143-150. I telephoned these results at the time of interpretation on 10/08/2017 at  10:32 am to Dr. Sharman Cheek, who verbally acknowledged these results. Electronically Signed   By: Hulan Saas M.D.   On: 10/08/2017 10:31   Ct Maxillofacial Wo Contrast  Result Date: 10/08/2017 CLINICAL DATA:  84 year old with dementia who fell earlier today sustaining a laceration to the UPPER lip and an abrasion to the nose. Multiple recent falls. Initial encounter. EXAM: CT HEAD WITHOUT CONTRAST CT MAXILLOFACIAL WITHOUT CONTRAST CT CERVICAL SPINE WITHOUT CONTRAST TECHNIQUE: Multidetector CT imaging of the head, cervical spine, and maxillofacial structures were performed using the standard protocol without intravenous contrast. Multiplanar CT image reconstructions of the cervical spine and maxillofacial structures were also generated. A metallic BB was placed on the right temple in order to reliably differentiate right from left. COMPARISON:  CT head and cervical spine 06/23/2017, 06/07/2015. CT head 07/01/2015. FINDINGS: CT HEAD FINDINGS Brain: Mixed attenuation subdural hematoma involving the RIGHT frontal convexity region, maximum thickness approximately 2.7 cm on the sagittal images, causing mass effect upon the RIGHT frontal lobe with effacement of cortical sulci and slight shift of the midline to the LEFT of approximately 3 mm. No evidence of parenchymal, subarachnoid or intraventricular hemorrhage. Severe cortical, deep and cerebellar atrophy as noted previously. Moderate changes of small vessel disease of the white matter diffusely, unchanged. Vascular: Severe BILATERAL carotid siphon atherosclerosis. No hyperdense vessel. Skull: No skull fracture or other focal osseous abnormality involving the skull. Other: None. CT MAXILLOFACIAL FINDINGS Osseous: No fractures identified involving the facial bones. Temporomandibular joints anatomically aligned with mild degenerative changes. Orbits: No  orbital fractures.  No evidence of intraorbital hematoma. Sinuses: Air-fluid level in the LEFT maxillary  sinus. Opacification of adjacent POSTERIOR RIGHT ethmoid air cells. Remaining paranasal sinuses, BILATERAL mastoid air cells and BILATERAL middle ear cavities well-aerated. Soft tissues: No visible soft tissue hematoma. CT CERVICAL SPINE FINDINGS Alignment: Anatomic POSTERIOR alignment. Skull base and vertebrae: No fractures identified involving the cervical spine. Diffuse BILATERAL facet degenerative changes without evidence of perched facets. Coronal reformatted images demonstrate an intact craniocervical junction, intact dens and intact lateral masses throughout. Small calcification ANTERIOR to the dens. Soft tissues and spinal canal: No evidence of paraspinous or spinal canal hematoma. No evidence of spinal stenosis. Disc levels: Severe disc space narrowing and associated endplate hypertrophic changes at C5-6. Moderate disc space narrowing and associated endplate hypertrophic changes at C6-7. Remaining disc spaces well-preserved. Facet and uncinate hypertrophy account for multilevel foraminal stenoses including moderate BILATERAL C3-4, mild BILATERAL C4-5, severe BILATERAL C5-6 and severe BILATERAL C6-7. Upper chest: Aortic arch and proximal great vessel atherosclerosis. 1 cm nodule involving the UPPER pole of the RIGHT lobe of the thyroid gland. Visualized superior mediastinum otherwise unremarkable. Visualized lung apices clear. Other: Mild atherosclerosis involving the LEFT common carotid artery at its bifurcation. IMPRESSION: CT Head: 1. Acute superimposed upon chronic (versus subacute) RIGHT frontal convexity subdural hematoma, maximum thickness approximately 2.7 cm. Associated mass effect and slight midline shift to the LEFT of approximately 3 mm. 2. Severe generalized atrophy and moderate chronic microvascular ischemic changes of the white matter. 3. No skull fractures. CT Maxillofacial: 1. No facial bone fractures identified. 2. Air-fluid level in the LEFT maxillary sinus, likely indicating mild acute  sinusitis. CT Cervical Spine: 1. No cervical spine fractures identified. 2. Degenerative disc disease and spondylosis at C5-6 (severe) and C6-7 (moderate). Multilevel foraminal stenoses as detailed above. 3. 1 cm RIGHT lobe thyroid nodule. This does not require further imaging evaluation. This follows ACR consensus guidelines: Managing Incidental Thyroid Nodules Detected on Imaging: White Paper of the ACR Incidental Thyroid Findings Committee. J Am Coll Radiol 2015; 12:143-150. I telephoned these results at the time of interpretation on 10/08/2017 at 10:32 am to Dr. Sharman CheekPHILLIP STAFFORD, who verbally acknowledged these results. Electronically Signed   By: Hulan Saashomas  Lawrence M.D.   On: 10/08/2017 10:31    Assessment & Plan:   Problem List Items Addressed This Visit    Recurrent falls while walking    Most recent fall occurred  After her shower when caregiver turned away to retrieve her clothes.       Laceration of frenum of upper lip    All 4 sutures on the outer surface of lip were removed without difficulty.  One of the 2 sutures on the underside of the lip was removed with considerable difficulty , prior to discovering that they were intended to resolve (not described as such in ER report)        A total of 40 minutes was spent with patient more than half of which was spent in counseling patient on the above mentioned issues , reviewing and explaining recent labs and imaging studies done, and coordination of care.  I am having Catherine RoesFay R. Prestage maintain her guaiFENesin, bismuth subsalicylate, alum & mag hydroxide-simeth, magnesium hydroxide, docusate sodium, acetaminophen, Vitamin D (Ergocalciferol), loratadine, Cranberry Fruit, omeprazole, loperamide, GNP ARTHRITIS PAIN RELIEF, traZODone, and mirtazapine.  No orders of the defined types were placed in this encounter.   There are no discontinued medications.  Follow-up: Return for general follow p.   Rosey Batheresa  Ether Griffins, MD

## 2017-10-21 NOTE — Patient Instructions (Addendum)
I was not able to remove one suture on the underside of her lip, but this does not need removal per ER physician   I reviewed her CTS and there were no broken bones or bleeding on the brain

## 2017-10-23 ENCOUNTER — Encounter: Payer: Self-pay | Admitting: Internal Medicine

## 2017-10-24 DIAGNOSIS — S01511A Laceration without foreign body of lip, initial encounter: Secondary | ICD-10-CM | POA: Insufficient documentation

## 2017-10-24 NOTE — Assessment & Plan Note (Signed)
All 4 sutures on the outer surface of lip were removed without difficulty.  One of the 2 sutures on the underside of the lip was removed with considerable difficulty , prior to discovering that they were intended to resolve (not described as such in ER report)

## 2017-10-24 NOTE — Assessment & Plan Note (Signed)
Most recent fall occurred  After her shower when caregiver turned away to retrieve her clothes.

## 2017-10-25 ENCOUNTER — Telehealth: Payer: Self-pay | Admitting: Internal Medicine

## 2017-10-25 NOTE — Telephone Encounter (Signed)
Please print the letter about the bed rail as requeested by Ms Fries's son and send to the NEW F[facility]   THANKS  Regards,   Duncan Dulleresa Tullo, MD

## 2017-10-26 ENCOUNTER — Telehealth: Payer: Self-pay

## 2017-10-26 NOTE — Telephone Encounter (Signed)
Copied from CRM [id number]. Topic: Appointment Scheduling - Scheduling Inquiry for Clinic >> Oct 26, 2017  2:20 PM Catherine Meyers, Kristie S wrote: Reason for CRM: Pt moved in last week to [facility]. They need pt to been seen with [name] for Updated FL2. First available currently in September. Please call back to schedule.

## 2017-10-27 NOTE — Telephone Encounter (Signed)
Pt has been scheduled with [name] on 11/04/2017 at 11:30. Pt's son is aware of the appt date and time.

## 2017-10-27 NOTE — Telephone Encounter (Signed)
According to son's mychart message the letter needed to state full length bed rail so the letter so retyped, printed and placed in quick sign folder.

## 2017-11-02 ENCOUNTER — Encounter: Payer: Self-pay | Admitting: Internal Medicine

## 2017-11-04 ENCOUNTER — Ambulatory Visit (INDEPENDENT_AMBULATORY_CARE_PROVIDER_SITE_OTHER): Payer: Medicare Other | Admitting: Internal Medicine

## 2017-11-04 ENCOUNTER — Encounter: Payer: Self-pay | Admitting: Internal Medicine

## 2017-11-04 VITALS — BP 120/70 | HR 68 | Temp 94.0°F | Resp 15

## 2017-11-04 DIAGNOSIS — R300 Dysuria: Secondary | ICD-10-CM | POA: Diagnosis not present

## 2017-11-04 DIAGNOSIS — R29898 Other symptoms and signs involving the musculoskeletal system: Secondary | ICD-10-CM | POA: Diagnosis not present

## 2017-11-04 DIAGNOSIS — F329 Major depressive disorder, single episode, unspecified: Secondary | ICD-10-CM

## 2017-11-04 DIAGNOSIS — R296 Repeated falls: Secondary | ICD-10-CM

## 2017-11-04 DIAGNOSIS — F01518 Vascular dementia, unspecified severity, with other behavioral disturbance: Secondary | ICD-10-CM

## 2017-11-04 DIAGNOSIS — F0151 Vascular dementia with behavioral disturbance: Secondary | ICD-10-CM | POA: Diagnosis not present

## 2017-11-04 DIAGNOSIS — F0153 Vascular dementia, unspecified severity, with mood disturbance: Secondary | ICD-10-CM

## 2017-11-04 MED ORDER — TRAZODONE HCL 50 MG PO TABS: 50.0000 mg | ORAL_TABLET | Freq: Every day | ORAL | 3 refills | Status: DC

## 2017-11-04 NOTE — Patient Instructions (Signed)
I have faxed DME orders for   Hospital bed Wheelchair  To [facility]  I have increased trazodone dose to 50 mg and sent to [facility]  See you in 6 months

## 2017-11-04 NOTE — Progress Notes (Signed)
Subjective:  Patient ID: Catherine Meyers, female    DOB: 12-11-1927  Age: 84 y.o. MRN: [id number]  CC: The primary encounter diagnosis was Weakness of back. Diagnoses of Frequent falls, Dysuria, Vascular dementia with depressed mood, Recurrent falls while walking, and Vascular dementia with behavior disturbance were also pertinent to this visit.  HPI Catherine Meyers presents for follow up on dementia and recent fall with  Laceration to upper lip requiring suturing.  Since her last visit her son has had to move her to another facility due to unexpected closure by the [facility] Board of the rest home she was previously residing in.  She is accompanied by her son Kathlene November and a representative form the new facility.  .  Patient has  Relocated to a new facility .  Patient is no longer ambulating without assistance, but lacks the cognitive ability to ask for help or understand what actions are safe. Her recent fall occurred in a split second when an aide turned her back to retrieve her clothes after a shower.    She is in need of a new wheelchair,  And a hospital bed with side rails to prevent her from climbing out at night   Outpatient Medications Prior to Visit  Medication Sig Dispense Refill  . acetaminophen (MAPAP) 500 MG tablet Take 1 tablet (500 mg total) by mouth every 6 (six) hours as needed. 60 tablet 11  . alum & mag hydroxide-simeth (MAALOX/MYLANTA) 200-200-20 MG/5ML suspension Take 15 mLs by mouth every 6 (six) hours as needed for indigestion or heartburn.    . bismuth subsalicylate (PEPTO BISMOL) 262 MG/15ML suspension Take 30 mLs by mouth every 6 (six) hours as needed.    . Cranberry Fruit 405 MG CAPS TAKE 1 CAPSULE BY MOUTH TWICE DAILY FOR URINARY HEALTH 60 each 1  . docusate sodium (COLACE) 100 MG capsule Take 1 capsule (100 mg total) by mouth 2 (two) times daily. 10 capsule 0  . GNP ARTHRITIS PAIN RELIEF 650 MG CR tablet TAKE 1 TABLET BY MOUTH TWICE DAILY FOR DISCOMFORT 60 tablet 5  . guaiFENesin  (ROBITUSSIN) 100 MG/5ML liquid Take 200 mg by mouth 3 (three) times daily as needed for cough.    . loperamide (IMODIUM) 2 MG capsule TAKE PER STANDING AS NEEDED ORDERS 15 capsule 0  . loratadine (CLARITIN) 10 MG tablet TAKE 1 TABLET BY MOUTH ONCE DAILY FOR ALLERGIES. 90 tablet 1  . magnesium hydroxide (MILK OF MAGNESIA) 400 MG/5ML suspension Take 15 mLs by mouth daily as needed for mild constipation.    . mirtazapine (REMERON) 15 MG tablet TK 1 T PO QHS FOR SLEEP  2  . omeprazole (PRILOSEC) 40 MG capsule TAKE 1 CAPSULE BY MOUTH ONCE DAILY FOR REFLUX 90 capsule 2  . Vitamin D, Ergocalciferol, (DRISDOL) 50000 units CAPS capsule TAKE 1 CAPSULE BY MOUTH EVERY 14 DAYS FOR SUPPLEMENT 6 capsule 3  . traZODone (DESYREL) 50 MG tablet TK 1/2 T PO QHS FOR INSOMNIA  3   No facility-administered medications prior to visit.     Review of Systems;  Patient is unable to answer any questions .  Per her son dhe exhibits no signs of untreated pain.Marland Kitchen Her appetite is good. She is incontinent of stool and urine.she has exhibited occasional cough with thin liquids.    Objective:  BP 120/70 (BP Location: Left Arm, Patient Position: Sitting, Cuff Size: Normal)   Pulse 68   Temp (!) 94 F (34.4 C) (Oral)   Resp 15  SpO2 94%   BP Readings from Last 3 Encounters:  11/04/17 120/70  10/21/17 124/68  10/08/17 (!) 142/74    Wt Readings from Last 3 Encounters:  09/21/17 134 lb (60.8 kg)  06/23/17 140 lb (63.5 kg)  09/09/16 147 lb 12.8 oz (67 kg)    General appearance: alert, cooperative and appears stated age Ears: normal TM's and external ear canals both ears Throat: lips, mucosa, and tongue normal; teeth and gums normal Neck: no adenopathy, no carotid bruit, supple, symmetrical, trachea midline and thyroid not enlarged, symmetric, no tenderness/mass/nodules Back: symmetric, no curvature. ROM normal. No CVA tenderness. Lungs: clear to auscultation bilaterally Heart: regular rate and rhythm, S1, S2  normal, no murmur, click, rub or gallop Abdomen: soft, non-tender; bowel sounds normal; no masses,  no organomegaly Pulses: 2+ and symmetric Skin: Skin color, texture, turgor normal. No rashes or lesions Lymph nodes: Cervical, supraclavicular, and axillary nodes normal.  No results found for: HGBA1C  Lab Results  Component Value Date   CREATININE 0.83 06/23/2017   CREATININE 0.99 01/21/2016   CREATININE 0.97 03/20/2015    Lab Results  Component Value Date   WBC 6.7 06/23/2017   HGB 13.5 06/23/2017   HCT 40.4 06/23/2017   PLT 220 06/23/2017   GLUCOSE 165 (H) 06/23/2017   CHOL 176 05/04/2012   TRIG 95.0 05/04/2012   HDL 55.70 05/04/2012   LDLCALC 101 (H) 05/04/2012   ALT 17 06/23/2017   AST 25 06/23/2017   NA 140 06/23/2017   K 3.6 06/23/2017   CL 106 06/23/2017   CREATININE 0.83 06/23/2017   BUN 15 06/23/2017   CO2 25 06/23/2017   TSH 3.76 01/21/2016    Ct Head Wo Contrast  Result Date: 10/08/2017 CLINICAL DATA:  84 year old with dementia who fell earlier today sustaining a laceration to the UPPER lip and an abrasion to the nose. Multiple recent falls. Initial encounter. EXAM: CT HEAD WITHOUT CONTRAST CT MAXILLOFACIAL WITHOUT CONTRAST CT CERVICAL SPINE WITHOUT CONTRAST TECHNIQUE: Multidetector CT imaging of the head, cervical spine, and maxillofacial structures were performed using the standard protocol without intravenous contrast. Multiplanar CT image reconstructions of the cervical spine and maxillofacial structures were also generated. A metallic BB was placed on the right temple in order to reliably differentiate right from left. COMPARISON:  CT head and cervical spine 06/23/2017, 06/07/2015. CT head 07/01/2015. FINDINGS: CT HEAD FINDINGS Brain: Mixed attenuation subdural hematoma involving the RIGHT frontal convexity region, maximum thickness approximately 2.7 cm on the sagittal images, causing mass effect upon the RIGHT frontal lobe with effacement of cortical sulci and  slight shift of the midline to the LEFT of approximately 3 mm. No evidence of parenchymal, subarachnoid or intraventricular hemorrhage. Severe cortical, deep and cerebellar atrophy as noted previously. Moderate changes of small vessel disease of the white matter diffusely, unchanged. Vascular: Severe BILATERAL carotid siphon atherosclerosis. No hyperdense vessel. Skull: No skull fracture or other focal osseous abnormality involving the skull. Other: None. CT MAXILLOFACIAL FINDINGS Osseous: No fractures identified involving the facial bones. Temporomandibular joints anatomically aligned with mild degenerative changes. Orbits: No orbital fractures.  No evidence of intraorbital hematoma. Sinuses: Air-fluid level in the LEFT maxillary sinus. Opacification of adjacent POSTERIOR RIGHT ethmoid air cells. Remaining paranasal sinuses, BILATERAL mastoid air cells and BILATERAL middle ear cavities well-aerated. Soft tissues: No visible soft tissue hematoma. CT CERVICAL SPINE FINDINGS Alignment: Anatomic POSTERIOR alignment. Skull base and vertebrae: No fractures identified involving the cervical spine. Diffuse BILATERAL facet degenerative changes without evidence of perched  facets. Coronal reformatted images demonstrate an intact craniocervical junction, intact dens and intact lateral masses throughout. Small calcification ANTERIOR to the dens. Soft tissues and spinal canal: No evidence of paraspinous or spinal canal hematoma. No evidence of spinal stenosis. Disc levels: Severe disc space narrowing and associated endplate hypertrophic changes at C5-6. Moderate disc space narrowing and associated endplate hypertrophic changes at C6-7. Remaining disc spaces well-preserved. Facet and uncinate hypertrophy account for multilevel foraminal stenoses including moderate BILATERAL C3-4, mild BILATERAL C4-5, severe BILATERAL C5-6 and severe BILATERAL C6-7. Upper chest: Aortic arch and proximal great vessel atherosclerosis. 1 cm nodule  involving the UPPER pole of the RIGHT lobe of the thyroid gland. Visualized superior mediastinum otherwise unremarkable. Visualized lung apices clear. Other: Mild atherosclerosis involving the LEFT common carotid artery at its bifurcation. IMPRESSION: CT Head: 1. Acute superimposed upon chronic (versus subacute) RIGHT frontal convexity subdural hematoma, maximum thickness approximately 2.7 cm. Associated mass effect and slight midline shift to the LEFT of approximately 3 mm. 2. Severe generalized atrophy and moderate chronic microvascular ischemic changes of the white matter. 3. No skull fractures. CT Maxillofacial: 1. No facial bone fractures identified. 2. Air-fluid level in the LEFT maxillary sinus, likely indicating mild acute sinusitis. CT Cervical Spine: 1. No cervical spine fractures identified. 2. Degenerative disc disease and spondylosis at C5-6 (severe) and C6-7 (moderate). Multilevel foraminal stenoses as detailed above. 3. 1 cm RIGHT lobe thyroid nodule. This does not require further imaging evaluation. This follows ACR consensus guidelines: Managing Incidental Thyroid Nodules Detected on Imaging: White Paper of the ACR Incidental Thyroid Findings Committee. J Am Coll Radiol 2015; 12:143-150. I telephoned these results at the time of interpretation on 10/08/2017 at 10:32 am to Dr. Sharman Cheek, who verbally acknowledged these results. Electronically Signed   By: Hulan Saas M.D.   On: 10/08/2017 10:31   Ct Cervical Spine Wo Contrast  Result Date: 10/08/2017 CLINICAL DATA:  84 year old with dementia who fell earlier today sustaining a laceration to the UPPER lip and an abrasion to the nose. Multiple recent falls. Initial encounter. EXAM: CT HEAD WITHOUT CONTRAST CT MAXILLOFACIAL WITHOUT CONTRAST CT CERVICAL SPINE WITHOUT CONTRAST TECHNIQUE: Multidetector CT imaging of the head, cervical spine, and maxillofacial structures were performed using the standard protocol without intravenous contrast.  Multiplanar CT image reconstructions of the cervical spine and maxillofacial structures were also generated. A metallic BB was placed on the right temple in order to reliably differentiate right from left. COMPARISON:  CT head and cervical spine 06/23/2017, 06/07/2015. CT head 07/01/2015. FINDINGS: CT HEAD FINDINGS Brain: Mixed attenuation subdural hematoma involving the RIGHT frontal convexity region, maximum thickness approximately 2.7 cm on the sagittal images, causing mass effect upon the RIGHT frontal lobe with effacement of cortical sulci and slight shift of the midline to the LEFT of approximately 3 mm. No evidence of parenchymal, subarachnoid or intraventricular hemorrhage. Severe cortical, deep and cerebellar atrophy as noted previously. Moderate changes of small vessel disease of the white matter diffusely, unchanged. Vascular: Severe BILATERAL carotid siphon atherosclerosis. No hyperdense vessel. Skull: No skull fracture or other focal osseous abnormality involving the skull. Other: None. CT MAXILLOFACIAL FINDINGS Osseous: No fractures identified involving the facial bones. Temporomandibular joints anatomically aligned with mild degenerative changes. Orbits: No orbital fractures.  No evidence of intraorbital hematoma. Sinuses: Air-fluid level in the LEFT maxillary sinus. Opacification of adjacent POSTERIOR RIGHT ethmoid air cells. Remaining paranasal sinuses, BILATERAL mastoid air cells and BILATERAL middle ear cavities well-aerated. Soft tissues: No visible soft tissue hematoma.  CT CERVICAL SPINE FINDINGS Alignment: Anatomic POSTERIOR alignment. Skull base and vertebrae: No fractures identified involving the cervical spine. Diffuse BILATERAL facet degenerative changes without evidence of perched facets. Coronal reformatted images demonstrate an intact craniocervical junction, intact dens and intact lateral masses throughout. Small calcification ANTERIOR to the dens. Soft tissues and spinal canal: No  evidence of paraspinous or spinal canal hematoma. No evidence of spinal stenosis. Disc levels: Severe disc space narrowing and associated endplate hypertrophic changes at C5-6. Moderate disc space narrowing and associated endplate hypertrophic changes at C6-7. Remaining disc spaces well-preserved. Facet and uncinate hypertrophy account for multilevel foraminal stenoses including moderate BILATERAL C3-4, mild BILATERAL C4-5, severe BILATERAL C5-6 and severe BILATERAL C6-7. Upper chest: Aortic arch and proximal great vessel atherosclerosis. 1 cm nodule involving the UPPER pole of the RIGHT lobe of the thyroid gland. Visualized superior mediastinum otherwise unremarkable. Visualized lung apices clear. Other: Mild atherosclerosis involving the LEFT common carotid artery at its bifurcation. IMPRESSION: CT Head: 1. Acute superimposed upon chronic (versus subacute) RIGHT frontal convexity subdural hematoma, maximum thickness approximately 2.7 cm. Associated mass effect and slight midline shift to the LEFT of approximately 3 mm. 2. Severe generalized atrophy and moderate chronic microvascular ischemic changes of the white matter. 3. No skull fractures. CT Maxillofacial: 1. No facial bone fractures identified. 2. Air-fluid level in the LEFT maxillary sinus, likely indicating mild acute sinusitis. CT Cervical Spine: 1. No cervical spine fractures identified. 2. Degenerative disc disease and spondylosis at C5-6 (severe) and C6-7 (moderate). Multilevel foraminal stenoses as detailed above. 3. 1 cm RIGHT lobe thyroid nodule. This does not require further imaging evaluation. This follows ACR consensus guidelines: Managing Incidental Thyroid Nodules Detected on Imaging: White Paper of the ACR Incidental Thyroid Findings Committee. J Am Coll Radiol 2015; 12:143-150. I telephoned these results at the time of interpretation on 10/08/2017 at 10:32 am to Dr. Sharman CheekPHILLIP STAFFORD, who verbally acknowledged these results. Electronically  Signed   By: Hulan Saashomas  Lawrence M.D.   On: 10/08/2017 10:31   Ct Maxillofacial Wo Contrast  Result Date: 10/08/2017 CLINICAL DATA:  84 year old with dementia who fell earlier today sustaining a laceration to the UPPER lip and an abrasion to the nose. Multiple recent falls. Initial encounter. EXAM: CT HEAD WITHOUT CONTRAST CT MAXILLOFACIAL WITHOUT CONTRAST CT CERVICAL SPINE WITHOUT CONTRAST TECHNIQUE: Multidetector CT imaging of the head, cervical spine, and maxillofacial structures were performed using the standard protocol without intravenous contrast. Multiplanar CT image reconstructions of the cervical spine and maxillofacial structures were also generated. A metallic BB was placed on the right temple in order to reliably differentiate right from left. COMPARISON:  CT head and cervical spine 06/23/2017, 06/07/2015. CT head 07/01/2015. FINDINGS: CT HEAD FINDINGS Brain: Mixed attenuation subdural hematoma involving the RIGHT frontal convexity region, maximum thickness approximately 2.7 cm on the sagittal images, causing mass effect upon the RIGHT frontal lobe with effacement of cortical sulci and slight shift of the midline to the LEFT of approximately 3 mm. No evidence of parenchymal, subarachnoid or intraventricular hemorrhage. Severe cortical, deep and cerebellar atrophy as noted previously. Moderate changes of small vessel disease of the white matter diffusely, unchanged. Vascular: Severe BILATERAL carotid siphon atherosclerosis. No hyperdense vessel. Skull: No skull fracture or other focal osseous abnormality involving the skull. Other: None. CT MAXILLOFACIAL FINDINGS Osseous: No fractures identified involving the facial bones. Temporomandibular joints anatomically aligned with mild degenerative changes. Orbits: No orbital fractures.  No evidence of intraorbital hematoma. Sinuses: Air-fluid level in the LEFT maxillary sinus. Opacification  of adjacent POSTERIOR RIGHT ethmoid air cells. Remaining paranasal  sinuses, BILATERAL mastoid air cells and BILATERAL middle ear cavities well-aerated. Soft tissues: No visible soft tissue hematoma. CT CERVICAL SPINE FINDINGS Alignment: Anatomic POSTERIOR alignment. Skull base and vertebrae: No fractures identified involving the cervical spine. Diffuse BILATERAL facet degenerative changes without evidence of perched facets. Coronal reformatted images demonstrate an intact craniocervical junction, intact dens and intact lateral masses throughout. Small calcification ANTERIOR to the dens. Soft tissues and spinal canal: No evidence of paraspinous or spinal canal hematoma. No evidence of spinal stenosis. Disc levels: Severe disc space narrowing and associated endplate hypertrophic changes at C5-6. Moderate disc space narrowing and associated endplate hypertrophic changes at C6-7. Remaining disc spaces well-preserved. Facet and uncinate hypertrophy account for multilevel foraminal stenoses including moderate BILATERAL C3-4, mild BILATERAL C4-5, severe BILATERAL C5-6 and severe BILATERAL C6-7. Upper chest: Aortic arch and proximal great vessel atherosclerosis. 1 cm nodule involving the UPPER pole of the RIGHT lobe of the thyroid gland. Visualized superior mediastinum otherwise unremarkable. Visualized lung apices clear. Other: Mild atherosclerosis involving the LEFT common carotid artery at its bifurcation. IMPRESSION: CT Head: 1. Acute superimposed upon chronic (versus subacute) RIGHT frontal convexity subdural hematoma, maximum thickness approximately 2.7 cm. Associated mass effect and slight midline shift to the LEFT of approximately 3 mm. 2. Severe generalized atrophy and moderate chronic microvascular ischemic changes of the white matter. 3. No skull fractures. CT Maxillofacial: 1. No facial bone fractures identified. 2. Air-fluid level in the LEFT maxillary sinus, likely indicating mild acute sinusitis. CT Cervical Spine: 1. No cervical spine fractures identified. 2. Degenerative  disc disease and spondylosis at C5-6 (severe) and C6-7 (moderate). Multilevel foraminal stenoses as detailed above. 3. 1 cm RIGHT lobe thyroid nodule. This does not require further imaging evaluation. This follows ACR consensus guidelines: Managing Incidental Thyroid Nodules Detected on Imaging: White Paper of the ACR Incidental Thyroid Findings Committee. J Am Coll Radiol 2015; 12:143-150. I telephoned these results at the time of interpretation on 10/08/2017 at 10:32 am to Dr. Sharman Cheek, who verbally acknowledged these results. Electronically Signed   By: Hulan Saas M.D.   On: 10/08/2017 10:31    Assessment & Plan:   Problem List Items Addressed This Visit    Vascular dementia with depressed mood    Progressive,  Now some signs suggestive of recurrent aspiration when eating.  Continue mitrazapine and trazodone.  End of life issues reviewed  with son at length; DNR and MOST orders are in place       Relevant Medications   traZODone (DESYREL) 50 MG tablet   Dementia with behavioral disturbance    I have increased the dose of trazodone to 50 mg at bedtime       Relevant Medications   traZODone (DESYREL) 50 MG tablet   Recurrent falls while walking    She requires use of a manual wheelchair due to bilateral leg weakness and frequent falls. She requires a hospital bed with bed rails to prevent her from getting out of bed unassisted.        Other Visit Diagnoses    Weakness of back    -  Primary   Relevant Orders   DME Other see comment   Frequent falls       Relevant Orders   DME Other see comment   DME Other see comment   Dysuria       Relevant Orders   Urine Microscopic Only   Urine Culture  POCT urinalysis dipstick      I have changed Danella Sensing R. Moulton's traZODone. I am also having her maintain her guaiFENesin, bismuth subsalicylate, alum & mag hydroxide-simeth, magnesium hydroxide, docusate sodium, acetaminophen, Vitamin D (Ergocalciferol), loratadine, Cranberry  Fruit, omeprazole, loperamide, GNP ARTHRITIS PAIN RELIEF, and mirtazapine.  Meds ordered this encounter  Medications  . traZODone (DESYREL) 50 MG tablet    Sig: Take 1 tablet (50 mg total) by mouth at bedtime.    Dispense:  90 tablet    Refill:  3    Medications Discontinued During This Encounter  Medication Reason  . traZODone (DESYREL) 50 MG tablet     Follow-up: No follow-ups on file.   Sherlene Shams, MD

## 2017-11-05 ENCOUNTER — Telehealth: Payer: Self-pay | Admitting: Internal Medicine

## 2017-11-05 NOTE — Telephone Encounter (Signed)
He wants me to see her every other month?  That is excessive.  I   Wrote for a 6 month follow up  .    Are you referring to the The mouth rinse prescribed by the  ED physician? She doesn't need it any more.  It's not on her list   I will print a letter saying that it can be discontinued.

## 2017-11-05 NOTE — Telephone Encounter (Signed)
Pt's caregiver, Ander SladeJoy, called and stated that the pt's son would like the mouth rinse to be PRN and to come very other month for the pt.

## 2017-11-05 NOTE — Telephone Encounter (Signed)
Copied from CRM [phone]#[id number]. Topic: Quick Communication - See Telephone Encounter >> Nov 05, 2017 11:33 AM Arlyss Gandyichardson, Ruthellen Tippy N, NT wrote: CRM for notification. See Telephone encounter for: 11/05/17. Joy, pts caregiver states that her son would like the mouth rinse to be PRN and to come only every other month for this pt. CB#: [phone]

## 2017-11-05 NOTE — Telephone Encounter (Signed)
rx request 

## 2017-11-06 ENCOUNTER — Other Ambulatory Visit (INDEPENDENT_AMBULATORY_CARE_PROVIDER_SITE_OTHER): Payer: Medicare Other

## 2017-11-06 DIAGNOSIS — R300 Dysuria: Secondary | ICD-10-CM

## 2017-11-06 LAB — POCT URINALYSIS DIPSTICK
BILIRUBIN UA: NEGATIVE
GLUCOSE UA: POSITIVE — AB
Nitrite, UA: POSITIVE
Protein, UA: POSITIVE — AB
Spec Grav, UA: 1.02 (ref 1.010–1.025)
Urobilinogen, UA: 1 E.U./dL
pH, UA: 6 (ref 5.0–8.0)

## 2017-11-06 LAB — URINALYSIS, MICROSCOPIC ONLY

## 2017-11-06 NOTE — Telephone Encounter (Signed)
Spoke with pt's son and he stated that he did not request the mouth rinse to be used PRN and only every other month. Son stated that he knew the rinse was for only while she had her stitches. Explained to son that we faxed a letter to the home to discontinue the use of the mouth rinse all together. Son gave a verbal understanding.

## 2017-11-07 NOTE — Assessment & Plan Note (Signed)
I have increased the dose of trazodone to 50 mg at bedtime

## 2017-11-07 NOTE — Assessment & Plan Note (Signed)
Progressive,  Now some signs suggestive of recurrent aspiration when eating.  Continue mitrazapine and trazodone.  End of life issues reviewed  with son at length; DNR and MOST orders are in place

## 2017-11-07 NOTE — Assessment & Plan Note (Addendum)
She requires use of a manual wheelchair due to bilateral leg weakness and frequent falls. She requires a hospital bed with bed rails to prevent her from getting out of bed unassisted.

## 2017-11-08 LAB — URINE CULTURE
MICRO NUMBER: 90945646
SPECIMEN QUALITY: ADEQUATE

## 2017-11-09 ENCOUNTER — Ambulatory Visit: Payer: Medicare Other | Admitting: Family

## 2017-12-05 DIAGNOSIS — R296 Repeated falls: Secondary | ICD-10-CM | POA: Diagnosis not present

## 2017-12-05 DIAGNOSIS — I69311 Memory deficit following cerebral infarction: Secondary | ICD-10-CM | POA: Diagnosis not present

## 2017-12-05 DIAGNOSIS — R2681 Unsteadiness on feet: Secondary | ICD-10-CM | POA: Diagnosis not present

## 2017-12-05 DIAGNOSIS — Z9181 History of falling: Secondary | ICD-10-CM | POA: Diagnosis not present

## 2017-12-05 DIAGNOSIS — M6281 Muscle weakness (generalized): Secondary | ICD-10-CM | POA: Diagnosis not present

## 2017-12-05 DIAGNOSIS — F0151 Vascular dementia with behavioral disturbance: Secondary | ICD-10-CM | POA: Diagnosis not present

## 2017-12-07 DIAGNOSIS — M6281 Muscle weakness (generalized): Secondary | ICD-10-CM | POA: Diagnosis not present

## 2017-12-07 DIAGNOSIS — R2681 Unsteadiness on feet: Secondary | ICD-10-CM | POA: Diagnosis not present

## 2017-12-07 DIAGNOSIS — Z9181 History of falling: Secondary | ICD-10-CM | POA: Diagnosis not present

## 2017-12-07 DIAGNOSIS — R296 Repeated falls: Secondary | ICD-10-CM | POA: Diagnosis not present

## 2017-12-07 DIAGNOSIS — F0151 Vascular dementia with behavioral disturbance: Secondary | ICD-10-CM | POA: Diagnosis not present

## 2017-12-07 DIAGNOSIS — I69311 Memory deficit following cerebral infarction: Secondary | ICD-10-CM | POA: Diagnosis not present

## 2017-12-08 DIAGNOSIS — R2681 Unsteadiness on feet: Secondary | ICD-10-CM | POA: Diagnosis not present

## 2017-12-08 DIAGNOSIS — M6281 Muscle weakness (generalized): Secondary | ICD-10-CM | POA: Diagnosis not present

## 2017-12-08 DIAGNOSIS — Z9181 History of falling: Secondary | ICD-10-CM | POA: Diagnosis not present

## 2017-12-08 DIAGNOSIS — R296 Repeated falls: Secondary | ICD-10-CM | POA: Diagnosis not present

## 2017-12-08 DIAGNOSIS — F0151 Vascular dementia with behavioral disturbance: Secondary | ICD-10-CM | POA: Diagnosis not present

## 2017-12-08 DIAGNOSIS — I69311 Memory deficit following cerebral infarction: Secondary | ICD-10-CM | POA: Diagnosis not present

## 2017-12-09 DIAGNOSIS — M6281 Muscle weakness (generalized): Secondary | ICD-10-CM | POA: Diagnosis not present

## 2017-12-09 DIAGNOSIS — R296 Repeated falls: Secondary | ICD-10-CM | POA: Diagnosis not present

## 2017-12-09 DIAGNOSIS — F0151 Vascular dementia with behavioral disturbance: Secondary | ICD-10-CM | POA: Diagnosis not present

## 2017-12-09 DIAGNOSIS — I69311 Memory deficit following cerebral infarction: Secondary | ICD-10-CM | POA: Diagnosis not present

## 2017-12-09 DIAGNOSIS — R2681 Unsteadiness on feet: Secondary | ICD-10-CM | POA: Diagnosis not present

## 2017-12-09 DIAGNOSIS — Z9181 History of falling: Secondary | ICD-10-CM | POA: Diagnosis not present

## 2017-12-11 DIAGNOSIS — F0151 Vascular dementia with behavioral disturbance: Secondary | ICD-10-CM | POA: Diagnosis not present

## 2017-12-11 DIAGNOSIS — M6281 Muscle weakness (generalized): Secondary | ICD-10-CM | POA: Diagnosis not present

## 2017-12-11 DIAGNOSIS — R2681 Unsteadiness on feet: Secondary | ICD-10-CM | POA: Diagnosis not present

## 2017-12-11 DIAGNOSIS — R296 Repeated falls: Secondary | ICD-10-CM | POA: Diagnosis not present

## 2017-12-11 DIAGNOSIS — Z9181 History of falling: Secondary | ICD-10-CM | POA: Diagnosis not present

## 2017-12-11 DIAGNOSIS — I69311 Memory deficit following cerebral infarction: Secondary | ICD-10-CM | POA: Diagnosis not present

## 2017-12-14 DIAGNOSIS — R296 Repeated falls: Secondary | ICD-10-CM | POA: Diagnosis not present

## 2017-12-14 DIAGNOSIS — Z9181 History of falling: Secondary | ICD-10-CM | POA: Diagnosis not present

## 2017-12-14 DIAGNOSIS — R2681 Unsteadiness on feet: Secondary | ICD-10-CM | POA: Diagnosis not present

## 2017-12-14 DIAGNOSIS — I69311 Memory deficit following cerebral infarction: Secondary | ICD-10-CM | POA: Diagnosis not present

## 2017-12-14 DIAGNOSIS — F0151 Vascular dementia with behavioral disturbance: Secondary | ICD-10-CM | POA: Diagnosis not present

## 2017-12-14 DIAGNOSIS — M6281 Muscle weakness (generalized): Secondary | ICD-10-CM | POA: Diagnosis not present

## 2017-12-17 DIAGNOSIS — I69311 Memory deficit following cerebral infarction: Secondary | ICD-10-CM | POA: Diagnosis not present

## 2017-12-17 DIAGNOSIS — R296 Repeated falls: Secondary | ICD-10-CM | POA: Diagnosis not present

## 2017-12-17 DIAGNOSIS — R2681 Unsteadiness on feet: Secondary | ICD-10-CM | POA: Diagnosis not present

## 2017-12-17 DIAGNOSIS — Z9181 History of falling: Secondary | ICD-10-CM | POA: Diagnosis not present

## 2017-12-17 DIAGNOSIS — F0151 Vascular dementia with behavioral disturbance: Secondary | ICD-10-CM | POA: Diagnosis not present

## 2017-12-17 DIAGNOSIS — M6281 Muscle weakness (generalized): Secondary | ICD-10-CM | POA: Diagnosis not present

## 2017-12-21 DIAGNOSIS — M6281 Muscle weakness (generalized): Secondary | ICD-10-CM | POA: Diagnosis not present

## 2017-12-21 DIAGNOSIS — I69311 Memory deficit following cerebral infarction: Secondary | ICD-10-CM | POA: Diagnosis not present

## 2017-12-21 DIAGNOSIS — F0151 Vascular dementia with behavioral disturbance: Secondary | ICD-10-CM | POA: Diagnosis not present

## 2017-12-21 DIAGNOSIS — R2681 Unsteadiness on feet: Secondary | ICD-10-CM | POA: Diagnosis not present

## 2017-12-21 DIAGNOSIS — R296 Repeated falls: Secondary | ICD-10-CM | POA: Diagnosis not present

## 2017-12-21 DIAGNOSIS — Z9181 History of falling: Secondary | ICD-10-CM | POA: Diagnosis not present

## 2017-12-23 DIAGNOSIS — F0151 Vascular dementia with behavioral disturbance: Secondary | ICD-10-CM | POA: Diagnosis not present

## 2017-12-23 DIAGNOSIS — R296 Repeated falls: Secondary | ICD-10-CM | POA: Diagnosis not present

## 2017-12-23 DIAGNOSIS — I69311 Memory deficit following cerebral infarction: Secondary | ICD-10-CM | POA: Diagnosis not present

## 2017-12-23 DIAGNOSIS — Z9181 History of falling: Secondary | ICD-10-CM | POA: Diagnosis not present

## 2017-12-23 DIAGNOSIS — M6281 Muscle weakness (generalized): Secondary | ICD-10-CM | POA: Diagnosis not present

## 2017-12-23 DIAGNOSIS — R2681 Unsteadiness on feet: Secondary | ICD-10-CM | POA: Diagnosis not present

## 2017-12-25 ENCOUNTER — Other Ambulatory Visit: Payer: Self-pay | Admitting: Internal Medicine

## 2017-12-28 DIAGNOSIS — R296 Repeated falls: Secondary | ICD-10-CM | POA: Diagnosis not present

## 2017-12-28 DIAGNOSIS — M6281 Muscle weakness (generalized): Secondary | ICD-10-CM | POA: Diagnosis not present

## 2017-12-28 DIAGNOSIS — I69311 Memory deficit following cerebral infarction: Secondary | ICD-10-CM | POA: Diagnosis not present

## 2017-12-28 DIAGNOSIS — Z9181 History of falling: Secondary | ICD-10-CM | POA: Diagnosis not present

## 2017-12-28 DIAGNOSIS — R2681 Unsteadiness on feet: Secondary | ICD-10-CM | POA: Diagnosis not present

## 2017-12-28 DIAGNOSIS — F0151 Vascular dementia with behavioral disturbance: Secondary | ICD-10-CM | POA: Diagnosis not present

## 2017-12-28 NOTE — Telephone Encounter (Signed)
Last OV: 11/04/2017 Next OV: 09/23/2018

## 2017-12-30 DIAGNOSIS — F0151 Vascular dementia with behavioral disturbance: Secondary | ICD-10-CM | POA: Diagnosis not present

## 2017-12-30 DIAGNOSIS — I69311 Memory deficit following cerebral infarction: Secondary | ICD-10-CM | POA: Diagnosis not present

## 2017-12-30 DIAGNOSIS — M6281 Muscle weakness (generalized): Secondary | ICD-10-CM | POA: Diagnosis not present

## 2017-12-30 DIAGNOSIS — R2681 Unsteadiness on feet: Secondary | ICD-10-CM | POA: Diagnosis not present

## 2017-12-30 DIAGNOSIS — R296 Repeated falls: Secondary | ICD-10-CM | POA: Diagnosis not present

## 2017-12-30 DIAGNOSIS — Z9181 History of falling: Secondary | ICD-10-CM | POA: Diagnosis not present

## 2018-01-06 DIAGNOSIS — F0151 Vascular dementia with behavioral disturbance: Secondary | ICD-10-CM | POA: Diagnosis not present

## 2018-01-06 DIAGNOSIS — I69311 Memory deficit following cerebral infarction: Secondary | ICD-10-CM | POA: Diagnosis not present

## 2018-01-06 DIAGNOSIS — M6281 Muscle weakness (generalized): Secondary | ICD-10-CM | POA: Diagnosis not present

## 2018-01-06 DIAGNOSIS — Z9181 History of falling: Secondary | ICD-10-CM | POA: Diagnosis not present

## 2018-01-06 DIAGNOSIS — R2681 Unsteadiness on feet: Secondary | ICD-10-CM | POA: Diagnosis not present

## 2018-01-06 DIAGNOSIS — R296 Repeated falls: Secondary | ICD-10-CM | POA: Diagnosis not present

## 2018-01-07 DIAGNOSIS — I69311 Memory deficit following cerebral infarction: Secondary | ICD-10-CM | POA: Diagnosis not present

## 2018-01-07 DIAGNOSIS — M6281 Muscle weakness (generalized): Secondary | ICD-10-CM | POA: Diagnosis not present

## 2018-01-07 DIAGNOSIS — R296 Repeated falls: Secondary | ICD-10-CM | POA: Diagnosis not present

## 2018-01-07 DIAGNOSIS — R2681 Unsteadiness on feet: Secondary | ICD-10-CM | POA: Diagnosis not present

## 2018-01-07 DIAGNOSIS — Z9181 History of falling: Secondary | ICD-10-CM | POA: Diagnosis not present

## 2018-01-07 DIAGNOSIS — F0151 Vascular dementia with behavioral disturbance: Secondary | ICD-10-CM | POA: Diagnosis not present

## 2018-01-12 DIAGNOSIS — I69311 Memory deficit following cerebral infarction: Secondary | ICD-10-CM | POA: Diagnosis not present

## 2018-01-12 DIAGNOSIS — M6281 Muscle weakness (generalized): Secondary | ICD-10-CM | POA: Diagnosis not present

## 2018-01-12 DIAGNOSIS — Z9181 History of falling: Secondary | ICD-10-CM | POA: Diagnosis not present

## 2018-01-12 DIAGNOSIS — R296 Repeated falls: Secondary | ICD-10-CM | POA: Diagnosis not present

## 2018-01-12 DIAGNOSIS — R2681 Unsteadiness on feet: Secondary | ICD-10-CM | POA: Diagnosis not present

## 2018-01-12 DIAGNOSIS — F0151 Vascular dementia with behavioral disturbance: Secondary | ICD-10-CM | POA: Diagnosis not present

## 2018-01-14 DIAGNOSIS — R2681 Unsteadiness on feet: Secondary | ICD-10-CM | POA: Diagnosis not present

## 2018-01-14 DIAGNOSIS — M6281 Muscle weakness (generalized): Secondary | ICD-10-CM | POA: Diagnosis not present

## 2018-01-14 DIAGNOSIS — R296 Repeated falls: Secondary | ICD-10-CM | POA: Diagnosis not present

## 2018-01-14 DIAGNOSIS — Z9181 History of falling: Secondary | ICD-10-CM | POA: Diagnosis not present

## 2018-01-14 DIAGNOSIS — F0151 Vascular dementia with behavioral disturbance: Secondary | ICD-10-CM | POA: Diagnosis not present

## 2018-01-14 DIAGNOSIS — I69311 Memory deficit following cerebral infarction: Secondary | ICD-10-CM | POA: Diagnosis not present

## 2018-01-15 DIAGNOSIS — I69311 Memory deficit following cerebral infarction: Secondary | ICD-10-CM | POA: Diagnosis not present

## 2018-01-15 DIAGNOSIS — M6281 Muscle weakness (generalized): Secondary | ICD-10-CM | POA: Diagnosis not present

## 2018-01-15 DIAGNOSIS — R2681 Unsteadiness on feet: Secondary | ICD-10-CM | POA: Diagnosis not present

## 2018-01-15 DIAGNOSIS — Z9181 History of falling: Secondary | ICD-10-CM | POA: Diagnosis not present

## 2018-01-15 DIAGNOSIS — R296 Repeated falls: Secondary | ICD-10-CM | POA: Diagnosis not present

## 2018-01-15 DIAGNOSIS — F0151 Vascular dementia with behavioral disturbance: Secondary | ICD-10-CM | POA: Diagnosis not present

## 2018-01-18 DIAGNOSIS — R2681 Unsteadiness on feet: Secondary | ICD-10-CM | POA: Diagnosis not present

## 2018-01-18 DIAGNOSIS — Z9181 History of falling: Secondary | ICD-10-CM | POA: Diagnosis not present

## 2018-01-18 DIAGNOSIS — F0151 Vascular dementia with behavioral disturbance: Secondary | ICD-10-CM | POA: Diagnosis not present

## 2018-01-18 DIAGNOSIS — I69311 Memory deficit following cerebral infarction: Secondary | ICD-10-CM | POA: Diagnosis not present

## 2018-01-18 DIAGNOSIS — R296 Repeated falls: Secondary | ICD-10-CM | POA: Diagnosis not present

## 2018-01-18 DIAGNOSIS — M6281 Muscle weakness (generalized): Secondary | ICD-10-CM | POA: Diagnosis not present

## 2018-01-21 DIAGNOSIS — I69311 Memory deficit following cerebral infarction: Secondary | ICD-10-CM | POA: Diagnosis not present

## 2018-01-21 DIAGNOSIS — R2681 Unsteadiness on feet: Secondary | ICD-10-CM | POA: Diagnosis not present

## 2018-01-21 DIAGNOSIS — R296 Repeated falls: Secondary | ICD-10-CM | POA: Diagnosis not present

## 2018-01-21 DIAGNOSIS — F0151 Vascular dementia with behavioral disturbance: Secondary | ICD-10-CM | POA: Diagnosis not present

## 2018-01-21 DIAGNOSIS — Z9181 History of falling: Secondary | ICD-10-CM | POA: Diagnosis not present

## 2018-01-21 DIAGNOSIS — M6281 Muscle weakness (generalized): Secondary | ICD-10-CM | POA: Diagnosis not present

## 2018-01-27 DIAGNOSIS — Z9181 History of falling: Secondary | ICD-10-CM | POA: Diagnosis not present

## 2018-01-27 DIAGNOSIS — F0151 Vascular dementia with behavioral disturbance: Secondary | ICD-10-CM | POA: Diagnosis not present

## 2018-01-27 DIAGNOSIS — R296 Repeated falls: Secondary | ICD-10-CM | POA: Diagnosis not present

## 2018-01-27 DIAGNOSIS — M6281 Muscle weakness (generalized): Secondary | ICD-10-CM | POA: Diagnosis not present

## 2018-01-27 DIAGNOSIS — R2681 Unsteadiness on feet: Secondary | ICD-10-CM | POA: Diagnosis not present

## 2018-01-27 DIAGNOSIS — I69311 Memory deficit following cerebral infarction: Secondary | ICD-10-CM | POA: Diagnosis not present

## 2018-01-28 DIAGNOSIS — R296 Repeated falls: Secondary | ICD-10-CM | POA: Diagnosis not present

## 2018-01-28 DIAGNOSIS — R2681 Unsteadiness on feet: Secondary | ICD-10-CM | POA: Diagnosis not present

## 2018-01-28 DIAGNOSIS — M6281 Muscle weakness (generalized): Secondary | ICD-10-CM | POA: Diagnosis not present

## 2018-01-28 DIAGNOSIS — F0151 Vascular dementia with behavioral disturbance: Secondary | ICD-10-CM | POA: Diagnosis not present

## 2018-01-28 DIAGNOSIS — Z9181 History of falling: Secondary | ICD-10-CM | POA: Diagnosis not present

## 2018-01-28 DIAGNOSIS — I69311 Memory deficit following cerebral infarction: Secondary | ICD-10-CM | POA: Diagnosis not present

## 2018-03-29 ENCOUNTER — Telehealth: Payer: Self-pay | Admitting: *Deleted

## 2018-03-29 NOTE — Telephone Encounter (Signed)
Copied from CRM [id number]. Topic: General - Other >> Mar 29, 2018  1:26 PM Fanny BienIlderton, Jessica L wrote: Reason for CRM: Olegario Messierkathy grey from adult care [employer]licensure called and stated that [facility] doing [facility] and she needed to speak with the nurse regarding clinical information. Please advise

## 2018-03-30 ENCOUNTER — Telehealth: Payer: Self-pay

## 2018-03-30 NOTE — Telephone Encounter (Signed)
Caro LarocheKathy Grey with Adult care licensure calling to speak with a nurse or Dr Darrick Huntsmanullo. Dr Darrick Huntsmanullo wrote an order for patient medication to be crushed and facility was not crushing the med's. Would like to know why the order was written and what can the consequences be with the patient taking the whole pills. Requesting a call back with answers please.  Ph # [phone]

## 2018-03-30 NOTE — Telephone Encounter (Signed)
Copied from CRM [id number]. Topic: General - Other >> Mar 30, 2018 10:58 AM Marylen PontoMcneil, Ja-Kwan wrote: Reason for CRM: Murray Hodgkinsicole Kennedy with [facility] requests a copy of the Home Health orders from October 2019. Cb# [phone]  Fax# [phone]   **I have faxed to requested number.**

## 2018-03-30 NOTE — Telephone Encounter (Signed)
meds are crushed to make them easier to swallow  .  that't the only reason. . If she is not choking on the pill  There is no reason to continue crushing the.

## 2018-03-30 NOTE — Telephone Encounter (Signed)
I spoke with Olegario MessierKathy & advised on below.

## 2018-03-30 NOTE — Telephone Encounter (Signed)
Please advise 

## 2018-06-15 ENCOUNTER — Other Ambulatory Visit: Payer: Self-pay | Admitting: Internal Medicine

## 2018-06-15 NOTE — Telephone Encounter (Signed)
Last OV 11/17/2017   Last refilled TRAZODONE 11/04/2017 disp 90 with 3 refills   Next appt 09/23/2018  Sent to PCP for approval for controled.

## 2018-07-22 ENCOUNTER — Other Ambulatory Visit: Payer: Self-pay | Admitting: Internal Medicine

## 2018-08-29 ENCOUNTER — Other Ambulatory Visit: Payer: Self-pay | Admitting: Internal Medicine

## 2018-09-14 ENCOUNTER — Other Ambulatory Visit: Payer: Self-pay

## 2018-09-14 ENCOUNTER — Inpatient Hospital Stay
Admission: EM | Admit: 2018-09-14 | Discharge: 2018-09-17 | DRG: 690 | Disposition: A | Discharge status: Skilled Nursing Facility | Payer: Medicare Other | Attending: Internal Medicine | Admitting: Internal Medicine

## 2018-09-14 DIAGNOSIS — Z90710 Acquired absence of both cervix and uterus: Secondary | ICD-10-CM

## 2018-09-14 DIAGNOSIS — B9620 Unspecified Escherichia coli [E. coli] as the cause of diseases classified elsewhere: Secondary | ICD-10-CM | POA: Diagnosis present

## 2018-09-14 DIAGNOSIS — F0280 Dementia in other diseases classified elsewhere without behavioral disturbance: Secondary | ICD-10-CM | POA: Diagnosis present

## 2018-09-14 DIAGNOSIS — M1712 Unilateral primary osteoarthritis, left knee: Secondary | ICD-10-CM | POA: Diagnosis present

## 2018-09-14 DIAGNOSIS — R531 Weakness: Secondary | ICD-10-CM

## 2018-09-14 DIAGNOSIS — K219 Gastro-esophageal reflux disease without esophagitis: Secondary | ICD-10-CM | POA: Diagnosis present

## 2018-09-14 DIAGNOSIS — G309 Alzheimer's disease, unspecified: Secondary | ICD-10-CM | POA: Diagnosis present

## 2018-09-14 DIAGNOSIS — M8580 Other specified disorders of bone density and structure, unspecified site: Secondary | ICD-10-CM | POA: Diagnosis present

## 2018-09-14 DIAGNOSIS — Z79899 Other long term (current) drug therapy: Secondary | ICD-10-CM

## 2018-09-14 DIAGNOSIS — Z66 Do not resuscitate: Secondary | ICD-10-CM | POA: Diagnosis present

## 2018-09-14 DIAGNOSIS — F0150 Vascular dementia without behavioral disturbance: Secondary | ICD-10-CM | POA: Diagnosis present

## 2018-09-14 DIAGNOSIS — N3000 Acute cystitis without hematuria: Principal | ICD-10-CM | POA: Diagnosis present

## 2018-09-14 DIAGNOSIS — E559 Vitamin D deficiency, unspecified: Secondary | ICD-10-CM | POA: Diagnosis present

## 2018-09-14 DIAGNOSIS — Z1159 Encounter for screening for other viral diseases: Secondary | ICD-10-CM

## 2018-09-14 DIAGNOSIS — Z803 Family history of malignant neoplasm of breast: Secondary | ICD-10-CM

## 2018-09-14 DIAGNOSIS — N390 Urinary tract infection, site not specified: Secondary | ICD-10-CM | POA: Diagnosis present

## 2018-09-14 DIAGNOSIS — M1611 Unilateral primary osteoarthritis, right hip: Secondary | ICD-10-CM | POA: Diagnosis present

## 2018-09-14 LAB — CBC WITH DIFFERENTIAL/PLATELET
Abs Immature Granulocytes: 0.07 10*3/uL (ref 0.00–0.07)
Basophils Absolute: 0 10*3/uL (ref 0.0–0.1)
Basophils Relative: 0 %
Eosinophils Absolute: 0 10*3/uL (ref 0.0–0.5)
Eosinophils Relative: 0 %
HCT: 42.2 % (ref 36.0–46.0)
Hemoglobin: 13.6 g/dL (ref 12.0–15.0)
Immature Granulocytes: 1 %
Lymphocytes Relative: 8 %
Lymphs Abs: 1.1 10*3/uL (ref 0.7–4.0)
MCH: 32.1 pg (ref 26.0–34.0)
MCHC: 32.2 g/dL (ref 30.0–36.0)
MCV: 99.5 fL (ref 80.0–100.0)
Monocytes Absolute: 0.9 10*3/uL (ref 0.1–1.0)
Monocytes Relative: 7 %
Neutro Abs: 11.1 10*3/uL — ABNORMAL HIGH (ref 1.7–7.7)
Neutrophils Relative %: 84 %
Platelets: 255 10*3/uL (ref 150–400)
RBC: 4.24 MIL/uL (ref 3.87–5.11)
RDW: 13.2 % (ref 11.5–15.5)
WBC: 13.1 10*3/uL — ABNORMAL HIGH (ref 4.0–10.5)
nRBC: 0 % (ref 0.0–0.2)

## 2018-09-14 LAB — LIPASE, BLOOD: Lipase: 28 U/L (ref 11–51)

## 2018-09-14 LAB — URINALYSIS, COMPLETE (UACMP) WITH MICROSCOPIC
Bilirubin Urine: NEGATIVE
Glucose, UA: >=500 mg/dL — AB
Ketones, ur: NEGATIVE mg/dL
Nitrite: POSITIVE — AB
Protein, ur: NEGATIVE mg/dL
Specific Gravity, Urine: 1.022 (ref 1.005–1.030)
WBC, UA: >50 WBC/hpf — ABNORMAL HIGH (ref 0–5)
pH: 6 (ref 5.0–8.0)

## 2018-09-14 LAB — COMPREHENSIVE METABOLIC PANEL
ALT: 29 U/L (ref 0–44)
AST: 27 U/L (ref 15–41)
Albumin: 3.8 g/dL (ref 3.5–5.0)
Alkaline Phosphatase: 108 U/L (ref 38–126)
Anion gap: 10 (ref 5–15)
BUN: 28 mg/dL — ABNORMAL HIGH (ref 8–23)
CO2: 27 mmol/L (ref 22–32)
Calcium: 9 mg/dL (ref 8.9–10.3)
Chloride: 108 mmol/L (ref 98–111)
Creatinine, Ser: 0.81 mg/dL (ref 0.44–1.00)
GFR calc Af Amer: >60 mL/min (ref >60)
GFR calc non Af Amer: >60 mL/min (ref >60)
Glucose, Bld: 200 mg/dL — ABNORMAL HIGH (ref 70–99)
Potassium: 4 mmol/L (ref 3.5–5.1)
Sodium: 145 mmol/L (ref 135–145)
Total Bilirubin: 0.3 mg/dL (ref 0.3–1.2)
Total Protein: 7.2 g/dL (ref 6.5–8.1)

## 2018-09-14 LAB — TROPONIN I: Troponin I: 0.04 ng/mL (ref <0.03)

## 2018-09-14 LAB — SARS CORONAVIRUS 2 BY RT PCR (HOSPITAL ORDER, PERFORMED IN ~~LOC~~ HOSPITAL LAB): SARS Coronavirus 2: NEGATIVE

## 2018-09-14 LAB — LACTIC ACID, PLASMA
Lactic Acid, Venous: 2.2 mmol/L (ref 0.5–1.9)
Lactic Acid, Venous: 1.5 mmol/L (ref 0.5–1.9)

## 2018-09-14 LAB — GLUCOSE, CAPILLARY: Glucose-Capillary: 205 mg/dL — ABNORMAL HIGH (ref 70–99)

## 2018-09-14 MED ORDER — POLYETHYLENE GLYCOL 3350 17 G PO PACK
17.0000 g | PACK | Freq: Every day | ORAL | Status: DC | PRN
  Administered 2018-09-15: 17 g via ORAL
  Filled 2018-09-14: qty 1

## 2018-09-14 MED ORDER — LORATADINE 10 MG PO TABS
10.0000 mg | ORAL_TABLET | Freq: Every day | ORAL | Status: DC
  Administered 2018-09-15 – 2018-09-17 (×3): 10 mg via ORAL
  Filled 2018-09-14 (×3): qty 1

## 2018-09-14 MED ORDER — ACETAMINOPHEN 325 MG PO TABS
325.0000 mg | ORAL_TABLET | Freq: Four times a day (QID) | ORAL | Status: DC
  Administered 2018-09-15 – 2018-09-17 (×7): 325 mg via ORAL
  Filled 2018-09-14 (×7): qty 1

## 2018-09-14 MED ORDER — TRAZODONE HCL 50 MG PO TABS
50.0000 mg | ORAL_TABLET | Freq: Every day | ORAL | Status: DC
  Administered 2018-09-14 – 2018-09-16 (×3): 50 mg via ORAL
  Filled 2018-09-14 (×3): qty 1

## 2018-09-14 MED ORDER — SODIUM CHLORIDE 0.9 % IV BOLUS
1000.0000 mL | Freq: Once | INTRAVENOUS | Status: AC
  Administered 2018-09-14: 1000 mL via INTRAVENOUS

## 2018-09-14 MED ORDER — CRANBERRY 450 MG PO TABS: 450.0000 mg | ORAL_TABLET | Freq: Every day | ORAL | Status: DC

## 2018-09-14 MED ORDER — LOPERAMIDE HCL 2 MG PO CAPS: 2.0000 mg | ORAL_CAPSULE | Freq: Every day | ORAL | Status: DC | PRN

## 2018-09-14 MED ORDER — CIPROFLOXACIN IN D5W 400 MG/200ML IV SOLN
400.0000 mg | Freq: Once | INTRAVENOUS | Status: AC
  Administered 2018-09-14: 400 mg via INTRAVENOUS
  Filled 2018-09-14: qty 200

## 2018-09-14 MED ORDER — PANTOPRAZOLE SODIUM 40 MG PO TBEC
40.0000 mg | DELAYED_RELEASE_TABLET | Freq: Every day | ORAL | Status: DC
  Administered 2018-09-15 – 2018-09-17 (×3): 40 mg via ORAL
  Filled 2018-09-14 (×3): qty 1

## 2018-09-14 MED ORDER — MIRTAZAPINE 15 MG PO TABS
15.0000 mg | ORAL_TABLET | Freq: Every day | ORAL | Status: DC
  Administered 2018-09-14 – 2018-09-16 (×3): 15 mg via ORAL
  Filled 2018-09-14 (×3): qty 1

## 2018-09-14 MED ORDER — ENOXAPARIN SODIUM 40 MG/0.4ML ~~LOC~~ SOLN
40.0000 mg | SUBCUTANEOUS | Status: DC
  Administered 2018-09-14 – 2018-09-16 (×3): 40 mg via SUBCUTANEOUS
  Filled 2018-09-14 (×3): qty 0.4

## 2018-09-14 MED ORDER — CIPROFLOXACIN IN D5W 400 MG/200ML IV SOLN
400.0000 mg | Freq: Two times a day (BID) | INTRAVENOUS | Status: DC
  Administered 2018-09-15 (×2): 400 mg via INTRAVENOUS
  Filled 2018-09-14 (×4): qty 200

## 2018-09-14 MED ORDER — SODIUM CHLORIDE 0.9 % IV SOLN
Freq: Once | INTRAVENOUS | Status: AC
  Administered 2018-09-14: via INTRAVENOUS

## 2018-09-14 MED ORDER — ACETAMINOPHEN 325 MG PO TABS
325.0000 mg | ORAL_TABLET | Freq: Once | ORAL | Status: AC
  Administered 2018-09-14: 325 mg via ORAL
  Filled 2018-09-14: qty 1

## 2018-09-14 NOTE — ED Notes (Signed)
Cipro running through L ac IV. Pt educated to keep arms straight d/t ac IVs. Pt keeps bending arms.

## 2018-09-14 NOTE — Progress Notes (Signed)
Family Meeting Note  Advance Directive:yes carries out of facility DNR form  Today a meeting took place with the patient son Ronalee Belts Tierno on the phone. Patient is from an assisted living/family care home at baseline has advanced dementia. She is nonverbal. Linus Mako very minimal with a walker. Comes in because of abnormal behavior/not her usual self found to have UTI. Being admitted for the same. Patient care is out out of facility DNR form. She is confirmed DNR with son Ronalee Belts Rondinelli on the phone. Time spent 16 minutes. Patient's son has been updated.  Time 16 mins Fritzi Mandes, MD

## 2018-09-14 NOTE — ED Notes (Signed)
Pt continues to bend arms setting off pump; this RN educating continuously; will try arm board to keep antibiotic running. NS bolus 500cc in.

## 2018-09-14 NOTE — ED Notes (Signed)
EDP Catherine Meyers notified in person that pt's lactic and troponin elevated.

## 2018-09-14 NOTE — H&P (Signed)
[place] [place] NAME: Catherine Meyers    MR#:  [id number]  DATE OF BIRTH:  1927/06/25  DATE OF ADMISSION:  09/14/2018  PRIMARY CARE PHYSICIAN: Crecencio Mc, MD   REQUESTING/REFERRING PHYSICIAN: Dr. Archie Balboa  CHIEF COMPLAINT:   Patient not her usual self according to the staff at the facility HISTORY OF PRESENT ILLNESS:  Catherine Meyers  is a 85 y.o. female with a known history of advanced Alzheimer's dementia who is nonverbal at baseline and walks very little with a walker comes in from her facility due to above chief complaint. Patient is nonverbal. She is hemodynamically stable. She was found to have UTI. She received a dose of IV Cipro.  Patient is being admitted for acute UTI/cystitis  PAST MEDICAL HISTORY:   Past Medical History:  Diagnosis Date  . Arthritis    left knee  . Dementia ([place])   . Dementia ([place])   . Hepatitis   . Hepatitis, water-borne 1965  . Migraine   . Osteopenia   . Vitamin D deficiency disease     PAST SURGICAL HISTOIRY:   Past Surgical History:  Procedure Laterality Date  . ABDOMINAL HYSTERECTOMY  1986  . HIP PINNING,CANNULATED Right 03/18/2015   Procedure: CANNULATED HIP PINNING;  Surgeon: Hessie Knows, MD;  Location: [facility] ORS;  Service: Orthopedics;  Laterality: Right;  . KNEE ARTHROSCOPY  2003    SOCIAL HISTORY:   Social History   Tobacco Use  . Smoking status: Never Smoker  . Smokeless tobacco: Never Used  Substance Use Topics  . Alcohol use: No    FAMILY HISTORY:   Family History  Problem Relation Age of Onset  . Cancer Sister        breast    DRUG ALLERGIES:   Allergies  Allergen Reactions  . Ampicillin     Other reaction(s): Unknown  . Cefaclor Other (See Comments)    REVIEW OF SYSTEMS:  Review of Systems  Unable to perform ROS: Dementia     MEDICATIONS AT HOME:   Prior to Admission medications   Medication Sig Start Date End Date Taking? Authorizing  Provider  acetaminophen (TYLENOL) 325 MG tablet Take 325 mg by mouth 4 (four) times daily.   Yes [provider]  Cranberry 450 MG TABS TAKE ONE TABLET BY MOUTH EVERY DAY Patient taking differently: Take 450 mg by mouth daily.  08/30/18  Yes Crecencio Mc, MD  loperamide (IMODIUM) 2 MG capsule TAKE PER STANDING AS NEEDED ORDERS Patient taking differently: Take 2 mg by mouth daily as needed for diarrhea or loose stools.  03/13/17  Yes Crecencio Mc, MD  loratadine (CLARITIN) 10 MG tablet TAKE ONE TABLET BY MOUTH EVERY DAY Patient taking differently: Take 10 mg by mouth daily.  08/30/18  Yes Crecencio Mc, MD  mirtazapine (REMERON) 15 MG tablet TAKE ONE TABLET BY MOUTH AT BEDTIME Patient taking differently: Take 15 mg by mouth at bedtime.  07/24/18  Yes Crecencio Mc, MD  omeprazole (PRILOSEC) 40 MG capsule TAKE ONE CAPSULE BY MOUTH EVERY DAY *DO NOT CRUSH* Patient taking differently: Take 40 mg by mouth daily.  08/30/18  Yes Crecencio Mc, MD  traZODone (DESYREL) 50 MG tablet TAKE ONE TABLET BY MOUTH AT BEDTIME Patient taking differently: Take 50 mg by mouth at bedtime.  06/15/18  Yes Crecencio Mc, MD  Vitamin D, Ergocalciferol, (DRISDOL) 50000 units CAPS capsule TAKE ONE CAPSULE BY MOUTH EVERY 14 DAY  Patient taking differently: Take 50,000 Units by mouth every 14 (fourteen) days.  12/28/17  Yes Sherlene Shamsullo, Teresa L, MD      VITAL SIGNS:  Blood pressure (!) 155/80, pulse 71, temperature 98.1 F (36.7 C), temperature source Oral, resp. rate (!) 21, height 5\' 2"  (1.575 m), weight 63.5 kg, SpO2 95 %.  PHYSICAL EXAMINATION:  GENERAL:  85 y.o.-year-old patient lying in the bed with no acute distress.  EYES: Pupils equal, round, reactive to light and accommodation. No scleral icterus. Extraocular muscles intact.  HEENT: Head atraumatic, normocephalic. Oropharynx and nasopharynx clear.  NECK:  Supple, no jugular venous distention. No thyroid enlargement, no tenderness.  LUNGS: Normal  breath sounds bilaterally, no wheezing, rales,rhonchi or crepitation. No use of accessory muscles of respiration.  CARDIOVASCULAR: S1, S2 normal. No murmurs, rubs, or gallops.  ABDOMEN: Soft, nontender, nondistended. Bowel sounds present. No organomegaly or mass.  EXTREMITIES: No pedal edema, cyanosis, or clubbing.  NEUROLOGIC: nonfocal unable to assess patient nonverbal and unable to participate in exam PSYCHIATRIC: The patient is alert and non-verbal at baseline due to severe advanced dementia.  SKIN: No obvious rash, lesion, or ulcer. -- RN documentation  LABORATORY PANEL:   CBC Recent Labs  Lab 09/14/18 1733  WBC 13.1*  HGB 13.6  HCT 42.2  PLT 255   ------------------------------------------------------------------------------------------------------------------  Chemistries  Recent Labs  Lab 09/14/18 1733  NA 145  K 4.0  CL 108  CO2 27  GLUCOSE 200*  BUN 28*  CREATININE 0.81  CALCIUM 9.0  AST 27  ALT 29  ALKPHOS 108  BILITOT 0.3   ------------------------------------------------------------------------------------------------------------------  Cardiac Enzymes Recent Labs  Lab 09/14/18 1733  TROPONINI 0.04*   ------------------------------------------------------------------------------------------------------------------  RADIOLOGY:  No results found.  EKG:    IMPRESSION AND PLAN:   Catherine Meyers  is a 85 y.o. female with a known history of advanced Alzheimer's dementia who is nonverbal at baseline and walks very little with a walker comes in from her facility due to above chief complaint. Patient is nonverbal. She is hemodynamically stable. She was found to have UTI. She received a dose of IV Cipro.  1. UTI/acute cystitis -admit for observation -IV fluids, IV Cipro, follow urine culture  2. Advanced dementia -patient is from the facility. She is nonverbal at baseline. She walks very minimal with walker. -Continue home meds  3. Catherine Meyers continue  PPI  4. DVT prophylaxis Lovenox    All the records are reviewed and case discussed with ED provider.   CODE STATUS: DNR-- carries out of facility DNR form confirmed with patient's son  TOTAL TIME TAKING CARE OF THIS PATIENT: *50* minutes.    Catherine Meyers M.D on 09/14/2018 at 7:53 PM  Between 7am to 6pm - Pager - [phone]  After 6pm go to [email or web address] - password [email or web address] [facility]  [facility]  Office  [phone]  CC: Primary care physician; Sherlene Shamsullo, Teresa L, MD

## 2018-09-14 NOTE — ED Notes (Signed)
Sam and this RN completed I&O cath for urine. Pt had already urinated in bottoms and had BM. Peri care provided. Linens changed. Pt changed into gown.

## 2018-09-14 NOTE — ED Notes (Signed)
This RN keeps educating pt to keep arms straight in order for fluids and antibiotics to run. Pt keeps grimacing and rubbing her forehead.

## 2018-09-14 NOTE — ED Notes (Signed)
Pt rubbing head and grimacing.

## 2018-09-14 NOTE — ED Notes (Signed)
EKG completed

## 2018-09-14 NOTE — ED Notes (Signed)
Pt sleeping; rails up; bed locked low; call bell within reach.

## 2018-09-14 NOTE — ED Notes (Addendum)
Pt resting calmly in bed. Pt alert. Door open. Bed locked low. Rails up. Call bell within reach.

## 2018-09-14 NOTE — ED Notes (Signed)
2nd covid swab completed and taken to lab by this RN.

## 2018-09-14 NOTE — ED Provider Notes (Signed)
[facility] Emergency Department Provider Note   ____________________________________________   I have reviewed the triage vital signs and the nursing notes.   HISTORY  Chief Complaint Altered Mental Status   History limited by and level 5 caveat due to: Non verbal   HPI Catherine Meyers is a 85 y.o. female who presents to the emergency department today from care home because of concerns for abnormal behavior.  Patient apparently is baseline nonverbal.  She apparently does require some assistance to get up and walk.  Today however staff noticed that she was weaker than normal.  They were somewhat concerned that she had not had a bowel movement a couple days however when EMS arrived she had just had a bowel movement.  Patient herself is nonverbal and cannot give any history.   Records reviewed. Per medical record review patient has a history of dementia  Past Medical History:  Diagnosis Date  . Arthritis    left knee  . Dementia ([facility])   . Dementia ([facility])   . Hepatitis   . Hepatitis, water-borne 1965  . Migraine   . Osteopenia   . Vitamin D deficiency disease     Patient Active Problem List   Diagnosis Date Noted  . Laceration of frenum of upper lip Apr 21, 202019  . Dysphagia 07/21/2016  . Fracture of fifth metacarpal bone of right hand 08/09/2015  . Osteoarthritis of right hip 03/17/2015  . Recurrent falls while walking 11/05/2014  . Do not resuscitate status 11/05/2014  . Dementia with behavioral disturbance ([facility])   . Sprain of ankle 11/04/2013  . Osteopenia of the elderly 11/04/2013  . Encounter for preventive health examination 06/02/2013  . Vascular dementia with depressed mood ([facility]) 05/30/2012  . Vitamin D deficiency 05/05/2012  . Depression 05/04/2012    Past Surgical History:  Procedure Laterality Date  . ABDOMINAL HYSTERECTOMY  1986  . HIP PINNING,CANNULATED Right 03/18/2015   Procedure: CANNULATED HIP PINNING;  Surgeon: Kennedy BuckerMichael Menz, MD;   Location: [facility] ORS;  Service: Orthopedics;  Laterality: Right;  . KNEE ARTHROSCOPY  2003    Prior to Admission medications   Medication Sig Start Date End Date Taking? Authorizing Provider  acetaminophen (MAPAP) 500 MG tablet Take 1 tablet (500 mg total) by mouth every 6 (six) hours as needed. 06/12/15   Sherlene Shamsullo, Teresa L, MD  alum & mag hydroxide-simeth (MAALOX/MYLANTA) 200-200-20 MG/5ML suspension Take 15 mLs by mouth every 6 (six) hours as needed for indigestion or heartburn.    [provider]  bismuth subsalicylate (PEPTO BISMOL) 262 MG/15ML suspension Take 30 mLs by mouth every 6 (six) hours as needed.    [provider]  Cranberry 450 MG TABS TAKE ONE TABLET BY MOUTH EVERY DAY 08/30/18   Sherlene Shamsullo, Teresa L, MD  Cranberry Fruit 405 MG CAPS TAKE 1 CAPSULE BY MOUTH TWICE DAILY FOR URINARY HEALTH 01/12/17   Sherlene Shamsullo, Teresa L, MD  docusate sodium (COLACE) 100 MG capsule Take 1 capsule (100 mg total) by mouth 2 (two) times daily. 03/21/15   Enid BaasKalisetti, Radhika, MD  guaiFENesin (ROBITUSSIN) 100 MG/5ML liquid Take 200 mg by mouth 3 (three) times daily as needed for cough.    [provider]  loperamide (IMODIUM) 2 MG capsule TAKE PER STANDING AS NEEDED ORDERS 03/13/17   Sherlene Shamsullo, Teresa L, MD  loratadine (CLARITIN) 10 MG tablet TAKE ONE TABLET BY MOUTH EVERY DAY 08/30/18   Sherlene Shamsullo, Teresa L, MD  magnesium hydroxide (MILK OF MAGNESIA) 400 MG/5ML suspension Take 15 mLs  by mouth daily as needed for mild constipation.    [provider]  mirtazapine (REMERON) 15 MG tablet TAKE ONE TABLET BY MOUTH AT BEDTIME 07/24/18   Sherlene Shamsullo, Teresa L, MD  omeprazole (PRILOSEC) 40 MG capsule TAKE ONE CAPSULE BY MOUTH EVERY DAY *DO NOT CRUSH* 08/30/18   Sherlene Shamsullo, Teresa L, MD  QC ARTHRITIS PAIN RELIEF 650 MG CR tablet TAKE ONE TABLET BY MOUTH TWO TIMES A DAY 12/28/17   Sherlene Shamsullo, Teresa L, MD  traZODone (DESYREL) 50 MG tablet TAKE ONE TABLET BY MOUTH AT BEDTIME 06/15/18   Sherlene Shamsullo, Teresa L, MD  Vitamin D,  Ergocalciferol, (DRISDOL) 50000 units CAPS capsule TAKE ONE CAPSULE BY MOUTH EVERY 14 DAY 12/28/17   Sherlene Shamsullo, Teresa L, MD    Allergies Ampicillin and Cefaclor  Family History  Problem Relation Age of Onset  . Cancer Sister        breast    Social History Social History   Tobacco Use  . Smoking status: Never Smoker  . Smokeless tobacco: Never Used  Substance Use Topics  . Alcohol use: No  . Drug use: No    Review of Systems Unable to obtain secondary to non verbal status ____________________________________________   PHYSICAL EXAM:  VITAL SIGNS: ED Triage Vitals  Enc Vitals Group     BP 09/14/18 1749 (!) 155/83     Pulse Rate 09/14/18 1745 67     Resp 09/14/18 1745 18     Temp 09/14/18 1741 98.1 F (36.7 C)     Temp Source 09/14/18 1741 Oral     SpO2 09/14/18 1745 95 %     Weight 09/14/18 1742 140 lb (63.5 kg)     Height 09/14/18 1742 5\' 2"  (1.575 m)   Constitutional: Awake and alert.  Eyes: Conjunctivae are normal.  ENT      Head: Normocephalic and atraumatic.      Nose: No congestion/rhinnorhea.      Mouth/Throat: Mucous membranes are moist.      Neck: No stridor. Hematological/Lymphatic/Immunilogical: No cervical lymphadenopathy. Cardiovascular: Normal rate, regular rhythm.  No murmurs, rubs, or gallops.  Respiratory: Normal respiratory effort without tachypnea nor retractions. Breath sounds are clear and equal bilaterally. No wheezes/rales/rhonchi. Gastrointestinal: Soft and non tender. No rebound. No guarding.  Genitourinary: Deferred Musculoskeletal: Normal range of motion in all extremities. No lower extremity edema. Neurologic:  Awake and alert. Non verbal. Following commands. Skin:  Skin is warm, dry and intact. No rash noted. ____________________________________________    LABS (pertinent positives/negatives)  Lactic 2.2 Trop 0.04 Lipase 28 UA cloudy, positive nitrite, large leukocytes, >50WBC CBC wbc 13.1, hgb 13.6, plt 255 CMP wnl except  glu 200, BUN 28 ____________________________________________   EKG  I, Phineas SemenGraydon Ashur Glatfelter, attending physician, personally viewed and interpreted this EKG  EKG Time: 1751 Rate: 67 Rhythm: sinus rhythm Axis: normal Intervals: qtc 488 QRS: narrow, q waves v1, v2, v3 ST changes: no st elevation Impression: abnormal ekg   ____________________________________________    RADIOLOGY  None  ____________________________________________   PROCEDURES  Procedures  ____________________________________________   INITIAL IMPRESSION / ASSESSMENT AND PLAN / ED COURSE  Pertinent labs & imaging results that were available during my care of the patient were reviewed by me and considered in my medical decision making (see chart for details).   Patient presents from a care home because of concerns for weakness.  Work-up is consistent with a urinary tract infection.  Patient does have elevated lactic and white blood cell count.  Will start IV  fluids antibiotics.  Will plan on admission.  ____________________________________________   FINAL CLINICAL IMPRESSION(S) / ED DIAGNOSES  Final diagnoses:  Weakness  Lower urinary tract infectious disease     Note: This dictation was prepared with Dragon dictation. Any transcriptional errors that result from this process are unintentional     Nance Pear, MD 09/14/18 1934

## 2018-09-14 NOTE — ED Triage Notes (Signed)
Pt in via EMS from party at a friends house d/t alteration in baseline; history non-verbal; pt alert and will obey commands; abd pain; 130/74 BP; 96% RA with EMS.

## 2018-09-15 LAB — MRSA PCR SCREENING: MRSA by PCR: NEGATIVE

## 2018-09-15 NOTE — TOC Initial Note (Signed)
Transition of Care [facility]) - Initial/Assessment Note    Patient Details  Name: Catherine Meyers MRN: [id number] Date of Birth: 1928-03-28  Transition of Care [facility] CM/SW Contact:    Shelbie Hutching, RN Phone Number: 09/15/2018, 9:56 AM  Clinical Narrative:                 Patient is under observation for UTI and alteration from baseline.  Patient is from [facility] in [place].  Patient's son Legrand Como reports that he was contacted by the ALF and they do not want her to return to the facility. ALF reports that the patient's care has exceeded what they are able to care for.  Per son at baseline the patient can walk short distances with walker but is unable to bath, dress, or feed herself.  Patient has advanced dementia and is also non verbal.  RNCM will start SNF workup and bed search.  Son reports that patient can pay out of pocket for probably about a year.    Expected Discharge Plan: Skilled Nursing Facility Barriers to Discharge: Continued Medical Work up   Patient Goals and CMS Choice Patient states their goals for this hospitalization and ongoing recovery are:: Patient is unable to state goals but son needs help with nursing home placement, unable to return to assisted living      Expected Discharge Plan and Services Expected Discharge Plan: [place]   Discharge Planning Services: CM Consult   Living arrangements for the past 2 months: [place]                                      Prior Living Arrangements/Services Living arrangements for the past 2 months: [place] Lives with:: Facility Resident Patient language and need for interpreter reviewed:: No Do you feel safe going back to the place where you live?: (ALF is unable to care for patient's needs)      Need for Family Participation in Patient Care: Yes (Comment)(advanced dementia) Care giver support system in place?: Yes (comment)(son)   Criminal  Activity/Legal Involvement Pertinent to Current Situation/Hospitalization: No - Comment as needed  Activities of Daily Living Home Assistive Devices/Equipment: Gilford Rile (specify type) ADL Screening (condition at time of admission) Patient's cognitive ability adequate to safely complete daily activities?: No Is the patient deaf or have difficulty hearing?: Yes Does the patient have difficulty seeing, even when wearing glasses/contacts?: No Does the patient have difficulty concentrating, remembering, or making decisions?: Yes Patient able to express need for assistance with ADLs?: No Does the patient have difficulty dressing or bathing?: Yes Independently performs ADLs?: No Communication: Dependent Is this a change from baseline?: Pre-admission baseline Dressing (OT): Needs assistance Is this a change from baseline?: Pre-admission baseline Grooming: Needs assistance Is this a change from baseline?: Pre-admission baseline Bathing: Needs assistance Is this a change from baseline?: Pre-admission baseline Toileting: Needs assistance Is this a change from baseline?: Pre-admission baseline Walks in Home: Needs assistance Is this a change from baseline?: Pre-admission baseline Does the patient have difficulty walking or climbing stairs?: Yes Weakness of Legs: Both Weakness of Arms/Hands: None  Permission Sought/Granted Permission sought to share information with : Facility Sport and exercise psychologist, Case Optician, dispensing granted to share information with : Yes, Verbal Permission Granted     Permission granted to share info w AGENCY: Local SNF's        Emotional Assessment Appearance::  Appears stated age Attitude/Demeanor/Rapport: Unable to Assess(non verbal) Affect (typically observed): Pleasant   Alcohol / Substance Use: Not Applicable Psych Involvement: No (comment)  Admission diagnosis:  Lower urinary tract infectious disease [N39.0] Weakness [R53.1] Patient Active Problem List    Diagnosis Date Noted  . UTI (urinary tract infection) 09/14/2018  . Laceration of frenum of upper lip May 15, 202019  . Dysphagia 07/21/2016  . Fracture of fifth metacarpal bone of right hand 08/09/2015  . Osteoarthritis of right hip 03/17/2015  . Recurrent falls while walking 11/05/2014  . Do not resuscitate status 11/05/2014  . Dementia with behavioral disturbance (HCC)   . Sprain of ankle 11/04/2013  . Osteopenia of the elderly 11/04/2013  . Encounter for preventive health examination 06/02/2013  . Vascular dementia with depressed mood (HCC) 05/30/2012  . Vitamin D deficiency 05/05/2012  . Depression 05/04/2012   PCP:  Sherlene Shamsullo, Teresa L, MD Pharmacy:   [facility] [place] - [address] [place] [address] Phone: [phone] Fax: [phone]     Social Determinants of Health (SDOH) Interventions    Readmission Risk Interventions No flowsheet data found.

## 2018-09-15 NOTE — NC FL2 (Signed)
[place] LEVEL OF CARE SCREENING TOOL     IDENTIFICATION  Patient Name: Catherine Meyers Birthdate: Feb 04, 1928 Sex: female Admission Date (Current Location): 09/14/2018  [facility] and [place] Number:  Engineering geologist and Address:  [facility], [address] [place], [place] [address]      Provider Number: [id number]  Attending Physician Name and Address:  Gladstone Lighter, MD  Relative Name and Phone Number:  Mashell Sieben [phone]    Current Level of Care: Hospital Recommended Level of Care: [place] Prior Approval Number:    Date Approved/Denied:   PASRR Number: Pending  Discharge Plan: SNF    Current Diagnoses: Patient Active Problem List   Diagnosis Date Noted  . UTI (urinary tract infection) 09/14/2018  . Laceration of frenum of upper lip 12-07-202019  . Dysphagia 07/21/2016  . Fracture of fifth metacarpal bone of right hand 08/09/2015  . Osteoarthritis of right hip 03/17/2015  . Recurrent falls while walking 11/05/2014  . Do not resuscitate status 11/05/2014  . Dementia with behavioral disturbance ([place])   . Sprain of ankle 11/04/2013  . Osteopenia of the elderly 11/04/2013  . Encounter for preventive health examination 06/02/2013  . Vascular dementia with depressed mood ([place]) 05/30/2012  . Vitamin D deficiency 05/05/2012  . Depression 05/04/2012    Orientation RESPIRATION BLADDER Height & Weight        Normal Incontinent Weight: 64.9 kg Height:  5\' 2"  (157.5 cm)  BEHAVIORAL SYMPTOMS/MOOD NEUROLOGICAL BOWEL NUTRITION STATUS      Incontinent Diet  AMBULATORY STATUS COMMUNICATION OF NEEDS Skin   Extensive Assist Does not communicate Normal                       Personal Care Assistance Level of Assistance  Bathing, Feeding, Dressing, Total care Bathing Assistance: Maximum assistance Feeding assistance: Maximum assistance Dressing Assistance: Maximum assistance Total Care Assistance:  Maximum assistance   Functional Limitations Info             SPECIAL CARE FACTORS FREQUENCY  PT (By licensed PT), OT (By licensed OT)     PT Frequency: 5 times per week OT Frequency: 5 times per week            Contractures Contractures Info: Not present    Additional Factors Info  Code Status, Allergies Code Status Info: DNR Allergies Info: ampicillin, cefaclor           Current Medications (09/15/2018):  This is the current hospital active medication list Current Facility-Administered Medications  Medication Dose Route Frequency Provider Last Rate Last Dose  . acetaminophen (TYLENOL) tablet 325 mg  325 mg Oral QID Fritzi Mandes, MD   325 mg at 09/15/18 1450  . ciprofloxacin (CIPRO) IVPB 400 mg  400 mg Intravenous Q12H Fritzi Mandes, MD   Stopped at 09/15/18 1108  . enoxaparin (LOVENOX) injection 40 mg  40 mg Subcutaneous Q24H Fritzi Mandes, MD   40 mg at 09/14/18 2335  . loperamide (IMODIUM) capsule 2 mg  2 mg Oral Daily PRN Fritzi Mandes, MD      . loratadine (CLARITIN) tablet 10 mg  10 mg Oral Daily Fritzi Mandes, MD   10 mg at 09/15/18 0954  . mirtazapine (REMERON) tablet 15 mg  15 mg Oral QHS Fritzi Mandes, MD   15 mg at 09/14/18 2335  . pantoprazole (PROTONIX) EC tablet 40 mg  40 mg Oral Daily Fritzi Mandes, MD   40 mg at  09/15/18 0954  . polyethylene glycol (MIRALAX / GLYCOLAX) packet 17 g  17 g Oral Daily PRN Enedina FinnerPatel, Sona, MD   17 g at 09/15/18 0955  . traZODone (DESYREL) tablet 50 mg  50 mg Oral QHS Enedina FinnerPatel, Sona, MD   50 mg at 09/14/18 2335     Discharge Medications: Please see discharge summary for a list of discharge medications.  Relevant Imaging Results:  Relevant Lab Results:   Additional Information SS # [id number]  Allayne ButcherJeanna M Dionta Larke, RN

## 2018-09-15 NOTE — Care Management Obs Status (Signed)
[place] NOTIFICATION   Patient Details  Name: Catherine Meyers MRN: [id number] Date of Birth: 1927/10/23   Medicare Observation Status Notification Given:  Yes    Shelbie Hutching, RN 09/15/2018, 10:02 AM

## 2018-09-15 NOTE — Progress Notes (Signed)
[place] [facility] [place] NAME: Catherine Meyers    MR#:  [id number]  DATE OF BIRTH:  [birth year]  SUBJECTIVE:  CHIEF COMPLAINT:   Chief Complaint  Patient presents with  . Altered Mental Status   - PT consulted, nonverbal at baseline, unable to go back to ALF- will need skilled nursing. - on ABX for UTI  REVIEW OF SYSTEMS:  Review of Systems  Unable to perform ROS: Patient nonverbal    DRUG ALLERGIES:   Allergies  Allergen Reactions  . Ampicillin     Other reaction(s): Unknown  . Cefaclor Other (See Comments)    VITALS:  Blood pressure 124/87, pulse 64, temperature 98.8 F (37.1 C), temperature source Oral, resp. rate 16, height 5\' 2"  (1.575 m), weight 64.9 kg, SpO2 91 %.  PHYSICAL EXAMINATION:  Physical Exam   GENERAL:  85 y.o.-year-old elderly patient lying in the bed with no acute distress.  EYES: Pupils equal, round, reactive to light and accommodation. No scleral icterus. Extraocular muscles intact.  HEENT: Head atraumatic, normocephalic. Oropharynx and nasopharynx clear.  NECK:  Supple, no jugular venous distention. No thyroid enlargement, no tenderness.  LUNGS: Normal breath sounds bilaterally, no wheezing, rales,rhonchi or crepitation. No use of accessory muscles of respiration. Decreased bibasilar breath sounds CARDIOVASCULAR: S1, S2 normal. No  rubs, or gallops. 2/6 systolic murmur present ABDOMEN: Soft, nontender, nondistended. Bowel sounds present. No organomegaly or mass.  EXTREMITIES: No pedal edema, cyanosis, or clubbing.  NEUROLOGIC: Cranial nerves II through XII are intact. Alert, nonverbal. Extreme weakness in lower extremities. Sensation intact. Gait not checked.  PSYCHIATRIC: The patient is alert, but remains nonverbal. SKIN: No obvious rash, lesion, or ulcer.    LABORATORY PANEL:   CBC Recent Labs  Lab 09/14/18 1733  WBC 13.1*  HGB 13.6  HCT 42.2  PLT 255    ------------------------------------------------------------------------------------------------------------------  Chemistries  Recent Labs  Lab 09/14/18 1733  NA 145  K 4.0  CL 108  CO2 27  GLUCOSE 200*  BUN 28*  CREATININE 0.81  CALCIUM 9.0  AST 27  ALT 29  ALKPHOS 108  BILITOT 0.3   ------------------------------------------------------------------------------------------------------------------  Cardiac Enzymes Recent Labs  Lab 09/14/18 1733  TROPONINI 0.04*   ------------------------------------------------------------------------------------------------------------------  RADIOLOGY:  No results found.  EKG:   Orders placed or performed during the hospital encounter of 09/14/18  . EKG 12-Lead  . EKG 12-Lead  . ED EKG 12-Lead  . ED EKG 12-Lead    ASSESSMENT AND PLAN:   Catherine Meyers  is a 85 y.o. female with a known history of advanced Alzheimer's dementia who is nonverbal at baseline and walks very little with a walker comes in from ALF due to altered mental status.  1. UTI/acute cystitis -admit for observation -IV fluids, IV Cipro, follow urine culture  2. Advanced dementia -patient is from ALF. She is nonverbal at baseline. She walks very minimal with walker. -Continue home meds - unable to return back due to needing higher level of care- will need SNF - social worker consulted.  Physical therapy consulted  3. [name] continue PPI  4. DVT prophylaxis Lovenox     All the records are reviewed and case discussed with Care Management/Social Workerr. Management plans discussed with the patient, family and they are in agreement.  CODE STATUS: DNR  TOTAL TIME TAKING CARE OF THIS PATIENT: 38 minutes.   POSSIBLE D/C IN 2 DAYS, DEPENDING ON CLINICAL CONDITION.   Gladstone Lighter M.D on 09/15/2018 at 12:33  PM  Between 7am to 6pm - Pager - [phone]  After 6pm go to [email or web address] - Social research officer, governmentpassword [email or web address]  [facility] [place] [facility]  Office   [phone]  CC: Primary care physician; Sherlene Shamsullo, Teresa L, MD

## 2018-09-15 NOTE — Progress Notes (Signed)
To whom it may Concern:  Patient Catherine Meyers DOB 11-05-1927, has a primary diagnosis of advanced dementia which supersedes any psychiatric diagnosis.

## 2018-09-15 NOTE — Evaluation (Signed)
Physical Therapy Evaluation Patient Details Name: Catherine Meyers MRN: [id number] DOB: 1927-04-13 Today's Date: 09/15/2018   History of Present Illness  Catherine Meyers is an 34yoF who comes to [facility] after change in presentation and affect, found to have UTI. Baseline info is limited, but from extensive chart review pt is noted to have advanced dementia (now nonverbal) with expressive language deficits as far back as 2016 PT eval. Since at least that time pt has been in ALF and performing mostly transfers with RW, but requiring assistance for safety. Recently, patient has needed total assist for ADL including feeding. ALF reports they are unable to take patient back as she has exceeded their care capacity. Pt has had multiple visits to the ED that are falls related and unclude a hip fracture, SHD, and scalp laceration. PMH: Rt hip fx s/p ORIF, hepatits, osteoporosis.  Clinical Impression  Pt admitted with above diagnosis. Pt currently with functional limitations due to the deficits listed below (see "PT Problem List"). Upon entry, pt in bed, makes eye contact, smiles, is participatory and interactive. Pt remains nonverbal throughout. Author offers patient a combination of gestural cues and some verbal cues for movement initiation, hand placement, postural correction, all of which she does well with. MaxA needed to EOB, totalA for return to supine, intermittent modA required for sitting at EOB. Functional mobility assessment demonstrates increased effort/time requirements, poor tolerance, and need for physical assistance, whereas the patient likely performed these at a higher level of independence PTA. She currently is unable to tolerate independent sitting and her arm strength fatigues quickly and is limited in ability to correct posture. Pt ultimately is likely to transition from ALF to SNF as her dementia and ADL needs have progressed over the years, however she is acutely weak and participatory to a degree that  she would benefit from rehab to improve her capacity to perform mobility in the bed and perform transfers, which would have a significant impact on her quality of life. It is not recommended to formulate goals around progressing capacity for AMB, as the patient has a poor prognosis in this area 2/2 advanced dementia and has had multiple serious injuries related to falls over the years including hip fracture and SDH. 24/7 supervision is an absolute need at DC. Pt will benefit from skilled PT intervention to increase independence and safety with basic mobility in preparation for discharge to the venue listed below.       Follow Up Recommendations SNF;Supervision/Assistance - 24 hour    Equipment Recommendations  None recommended by PT    Recommendations for Other Services       Precautions / Restrictions Precautions Precautions: Fall Restrictions Weight Bearing Restrictions: No      Mobility  Bed Mobility Overal bed mobility: Needs Assistance Bed Mobility: Supine to Sit;Sit to Supine     Supine to sit: Max assist Sit to supine: Total assist;+2 for physical assistance   General bed mobility comments: difficulty sitting at EOB independently for >15 seconds. Verbal and gestural cues given for using hands to hold self up, which patient follows well, but is limited by fatigue.  Transfers                 General transfer comment: not appropriate to attempt at this time  Ambulation/Gait                Stairs            Wheelchair Mobility    Modified Rankin (Stroke  Patients Only)       Balance Overall balance assessment: Needs assistance;History of Falls Sitting-balance support: Bilateral upper extremity supported;Feet supported Sitting balance-Leahy Scale: Poor Sitting balance - Comments: independent sitting tolerated less than10-15 second bouts                                     Pertinent Vitals/Pain Pain Assessment: Faces Faces Pain  Scale: Hurts even more Pain Location: Unclear, possible Right hip/knee, grimaces and tears with some mobility, but is able to be made comfortable. Pain Intervention(s): Limited activity within patient's tolerance;Monitored during session    Home Living Family/patient expects to be discharged to:: Skilled nursing facility                 Additional Comments: ALF no longer able to provide care. Pt requires total care for ADL at baseline (including feeding) It sounds like patient could benefit from memory care at DC, but likely will need SNF level of care.    Prior Function Level of Independence: Needs assistance      ADL's / Homemaking Assistance Needed: total assist for all ADL (was needing care for bathing dressing, toiletting as far back as 2016)        Hand Dominance        Extremity/Trunk Assessment   Upper Extremity Assessment Upper Extremity Assessment: Generalized weakness(uses arms to pull self forward on bed rail/hands, but fatigues quickly.)    Lower Extremity Assessment Lower Extremity Assessment: Generalized weakness    Cervical / Trunk Assessment Cervical / Trunk Assessment: (attempting to come to sitting but too weak and painful.)  Communication   Communication: Expressive difficulties  Cognition Arousal/Alertness: Awake/alert Behavior During Therapy: WFL for tasks assessed/performed Overall Cognitive Status: History of cognitive impairments - at baseline                                 General Comments: Nonverbal as documented; mostly maintains a VERY LARGE open mouthed smile, is interactive, and follows gestural/verbal cues throughout, but difficulty responding to questioning in a meaningful way. Has intermittent facial change to grimacing and tearing with certain movement. At one point is having difficulty with repeated coughing, appears to be in pain with grimacing, cough is VERY weak and poorly productive.      General Comments       Exercises     Assessment/Plan    PT Assessment Patient needs continued PT services  PT Problem List Decreased mobility;Decreased balance;Decreased safety awareness;Decreased activity tolerance;Decreased cognition;Decreased strength       PT Treatment Interventions Balance training;Neuromuscular re-education;Cognitive remediation;Patient/family education;Functional mobility training;Therapeutic activities    PT Goals (Current goals can be found in the Care Plan section)  Acute Rehab PT Goals PT Goal Formulation: Patient unable to participate in goal setting Time For Goal Achievement: 09/28/18 Potential to Achieve Goals: Fair    Frequency Min 2X/week   Barriers to discharge Inaccessible home environment;Decreased caregiver support(ALF unwilling to take patient back, unable to provide needed care)      Co-evaluation               AM-PAC PT "6 Clicks" Mobility  Outcome Measure Help needed turning from your back to your side while in a flat bed without using bedrails?: Total Help needed moving from lying on your back to sitting on the side of a flat  bed without using bedrails?: Total Help needed moving to and from a bed to a chair (including a wheelchair)?: Total Help needed standing up from a chair using your arms (e.g., wheelchair or bedside chair)?: Total Help needed to walk in hospital room?: Total Help needed climbing 3-5 steps with a railing? : Total 6 Click Score: 6    End of Session   Activity Tolerance: Patient limited by fatigue;Patient limited by pain;Patient tolerated treatment well Patient left: in bed;with bed alarm set;Other (comment)(sitting tall as if in chair) Nurse Communication: Mobility status PT Visit Diagnosis: Other abnormalities of gait and mobility (R26.89);Muscle weakness (generalized) (M62.81);Repeated falls (R29.6)    Time: [id number] PT Time Calculation (min) (ACUTE ONLY): 15 min   Charges:   PT Evaluation $PT Eval Moderate Complexity: 1  Mod          4:16 PM, 09/15/18 Rosamaria LintsAllan C Buccola, PT, DPT Physical Therapist - [facility]  [phone] (ASCOM)    Buccola,Allan C 09/15/2018, 4:08 PM

## 2018-09-16 DIAGNOSIS — N3000 Acute cystitis without hematuria: Secondary | ICD-10-CM | POA: Diagnosis present

## 2018-09-16 DIAGNOSIS — M8580 Other specified disorders of bone density and structure, unspecified site: Secondary | ICD-10-CM | POA: Diagnosis present

## 2018-09-16 DIAGNOSIS — M1611 Unilateral primary osteoarthritis, right hip: Secondary | ICD-10-CM | POA: Diagnosis present

## 2018-09-16 DIAGNOSIS — K219 Gastro-esophageal reflux disease without esophagitis: Secondary | ICD-10-CM | POA: Diagnosis present

## 2018-09-16 DIAGNOSIS — F0150 Vascular dementia without behavioral disturbance: Secondary | ICD-10-CM | POA: Diagnosis present

## 2018-09-16 DIAGNOSIS — Z90710 Acquired absence of both cervix and uterus: Secondary | ICD-10-CM | POA: Diagnosis not present

## 2018-09-16 DIAGNOSIS — G309 Alzheimer's disease, unspecified: Secondary | ICD-10-CM | POA: Diagnosis present

## 2018-09-16 DIAGNOSIS — B9620 Unspecified Escherichia coli [E. coli] as the cause of diseases classified elsewhere: Secondary | ICD-10-CM | POA: Diagnosis present

## 2018-09-16 DIAGNOSIS — E559 Vitamin D deficiency, unspecified: Secondary | ICD-10-CM | POA: Diagnosis present

## 2018-09-16 DIAGNOSIS — N390 Urinary tract infection, site not specified: Secondary | ICD-10-CM | POA: Diagnosis present

## 2018-09-16 DIAGNOSIS — M1712 Unilateral primary osteoarthritis, left knee: Secondary | ICD-10-CM | POA: Diagnosis present

## 2018-09-16 DIAGNOSIS — Z66 Do not resuscitate: Secondary | ICD-10-CM | POA: Diagnosis present

## 2018-09-16 DIAGNOSIS — Z803 Family history of malignant neoplasm of breast: Secondary | ICD-10-CM | POA: Diagnosis not present

## 2018-09-16 DIAGNOSIS — F0280 Dementia in other diseases classified elsewhere without behavioral disturbance: Secondary | ICD-10-CM | POA: Diagnosis present

## 2018-09-16 DIAGNOSIS — Z79899 Other long term (current) drug therapy: Secondary | ICD-10-CM | POA: Diagnosis not present

## 2018-09-16 DIAGNOSIS — Z1159 Encounter for screening for other viral diseases: Secondary | ICD-10-CM | POA: Diagnosis not present

## 2018-09-16 LAB — CBC
HCT: 37.8 % (ref 36.0–46.0)
Hemoglobin: 12.5 g/dL (ref 12.0–15.0)
MCH: 31.9 pg (ref 26.0–34.0)
MCHC: 33.1 g/dL (ref 30.0–36.0)
MCV: 96.4 fL (ref 80.0–100.0)
Platelets: 165 10*3/uL (ref 150–400)
RBC: 3.92 MIL/uL (ref 3.87–5.11)
RDW: 12.7 % (ref 11.5–15.5)
WBC: 7 10*3/uL (ref 4.0–10.5)
nRBC: 0 % (ref 0.0–0.2)

## 2018-09-16 LAB — BASIC METABOLIC PANEL
Anion gap: 8 (ref 5–15)
BUN: 12 mg/dL (ref 8–23)
CO2: 23 mmol/L (ref 22–32)
Calcium: 8.3 mg/dL — ABNORMAL LOW (ref 8.9–10.3)
Chloride: 108 mmol/L (ref 98–111)
Creatinine, Ser: 0.89 mg/dL (ref 0.44–1.00)
GFR calc Af Amer: >60 mL/min (ref >60)
GFR calc non Af Amer: 58 mL/min — ABNORMAL LOW (ref >60)
Glucose, Bld: 128 mg/dL — ABNORMAL HIGH (ref 70–99)
Potassium: 3.6 mmol/L (ref 3.5–5.1)
Sodium: 139 mmol/L (ref 135–145)

## 2018-09-16 LAB — URINE CULTURE: Culture: >=100000 — AB

## 2018-09-16 MED ORDER — CEPHALEXIN 500 MG PO CAPS
500.0000 mg | ORAL_CAPSULE | Freq: Two times a day (BID) | ORAL | Status: DC
  Administered 2018-09-16 – 2018-09-17 (×3): 500 mg via ORAL
  Filled 2018-09-16 (×3): qty 1

## 2018-09-16 MED ORDER — VITAMIN D (ERGOCALCIFEROL) 1.25 MG (50000 UNIT) PO CAPS
50000.0000 [IU] | ORAL_CAPSULE | ORAL | Status: DC
  Administered 2018-09-16: 21:00:00 50000 [IU] via ORAL
  Filled 2018-09-16: qty 1

## 2018-09-16 MED ORDER — CEPHALEXIN 500 MG PO CAPS: 500.0000 mg | ORAL_CAPSULE | Freq: Two times a day (BID) | ORAL | 0 refills | Status: AC

## 2018-09-16 NOTE — Discharge Summary (Signed)
[employer] -  at [facility]   PATIENT NAME: Catherine Meyers    MR#:  [id number]  DATE OF BIRTH:  Oct 27, 1927  DATE OF ADMISSION:  09/14/2018   ADMITTING PHYSICIAN: Enedina FinnerSona Patel, MD  DATE OF DISCHARGE:  09/16/18  PRIMARY CARE PHYSICIAN: Sherlene Shamsullo, Teresa L, MD   ADMISSION DIAGNOSIS:   Lower urinary tract infectious disease [N39.0] Weakness [R53.1]  DISCHARGE DIAGNOSIS:   Active Problems:   UTI (urinary tract infection)   SECONDARY DIAGNOSIS:   Past Medical History:  Diagnosis Date  . Arthritis    left knee  . Dementia (HCC)   . Dementia (HCC)   . Hepatitis   . Hepatitis, water-borne 1965  . Migraine   . Osteopenia   . Vitamin D deficiency disease     HOSPITAL COURSE:   FayBrightis a85 y.o.femalewith a known history of advanced Alzheimer's dementia who is nonverbal at baseline and walks very little with a walker comes in from ALF due to altered mental status.  1.UTI/acute cystitis -IV fluids,  ABX changed to PO keflex based on sensitivities - cultures growing sensitive E.coli   2.Advanced dementia -patient is from ALF. She is nonverbal at baseline. She walks very minimal with walker. -Continue home meds- on remeron and trazodone - unable to return back due to needing higher level of care- will need SNF - social worker consulted.  Physical therapy consulted  3.Gerd continue PPI   Discharge to [facility] today or tomorrow- PASSR number pending  DISCHARGE CONDITIONS:   Guarded  CONSULTS OBTAINED:   None  DRUG ALLERGIES:   Allergies  Allergen Reactions  . Ampicillin     Other reaction(s): Unknown  . Cefaclor Other (See Comments)   DISCHARGE MEDICATIONS:   Allergies as of 09/16/2018      Reactions   Ampicillin    Other reaction(s): Unknown   Cefaclor Other (See Comments)      Medication List    TAKE these medications   acetaminophen 325 MG tablet Commonly known as: TYLENOL Take 325 mg by mouth 4 (four) times  daily.   cephALEXin 500 MG capsule Commonly known as: KEFLEX Take 1 capsule (500 mg total) by mouth every 12 (twelve) hours for 5 days.   Cranberry 450 MG Tabs TAKE ONE TABLET BY MOUTH EVERY DAY   loperamide 2 MG capsule Commonly known as: IMODIUM TAKE PER STANDING AS NEEDED ORDERS What changed: See the new instructions.   loratadine 10 MG tablet Commonly known as: CLARITIN TAKE ONE TABLET BY MOUTH EVERY DAY   mirtazapine 15 MG tablet Commonly known as: REMERON TAKE ONE TABLET BY MOUTH AT BEDTIME   omeprazole 40 MG capsule Commonly known as: PRILOSEC TAKE ONE CAPSULE BY MOUTH EVERY DAY *DO NOT CRUSH* What changed: See the new instructions.   traZODone 50 MG tablet Commonly known as: DESYREL TAKE ONE TABLET BY MOUTH AT BEDTIME   Vitamin D (Ergocalciferol) 1.25 MG (50000 UT) Caps capsule Commonly known as: DRISDOL TAKE ONE CAPSULE BY MOUTH EVERY 14 DAY What changed: See the new instructions.        DISCHARGE INSTRUCTIONS:   1. PCP f/u in 1-2 weeks 2. Palliative care follow up  DIET:   Dysphagia 1 with thin liquids Extra Gravy on meats, potatoes; yogurt and puddings and cream soups. NO Straws.  ACTIVITY:   Activity as tolerated  OXYGEN:   Home Oxygen: No.  Oxygen Delivery: room air  DISCHARGE LOCATION:   nursing home   If you experience worsening of  your admission symptoms, develop shortness of breath, life threatening emergency, suicidal or homicidal thoughts you must seek medical attention immediately by calling 911 or calling your MD immediately  if symptoms less severe.  You Must read complete instructions/literature along with all the possible adverse reactions/side effects for all the Medicines you take and that have been prescribed to you. Take any new Medicines after you have completely understood and accpet all the possible adverse reactions/side effects.   Please note  You were cared for by a hospitalist during your hospital stay. If you  have any questions about your discharge medications or the care you received while you were in the hospital after you are discharged, you can call the unit and asked to speak with the hospitalist on call if the hospitalist that took care of you is not available. Once you are discharged, your primary care physician will handle any further medical issues. Please note that NO REFILLS for any discharge medications will be authorized once you are discharged, as it is imperative that you return to your primary care physician (or establish a relationship with a primary care physician if you do not have one) for your aftercare needs so that they can reassess your need for medications and monitor your lab values.    On the day of Discharge:  VITAL SIGNS:   Blood pressure (!) 151/69, pulse 60, temperature 97.8 F (36.6 C), temperature source Oral, resp. rate 18, height 5\' 2"  (1.575 m), weight 64.9 kg, SpO2 93 %.  PHYSICAL EXAMINATION:   GENERAL:  85 y.o.-year-old elderly patient lying in the bed with no acute distress.  EYES: Pupils equal, round, reactive to light and accommodation. No scleral icterus. Extraocular muscles intact.  HEENT: Head atraumatic, normocephalic. Oropharynx and nasopharynx clear.  NECK:  Supple, no jugular venous distention. No thyroid enlargement, no tenderness.  LUNGS: Normal breath sounds bilaterally, no wheezing, rales,rhonchi or crepitation. No use of accessory muscles of respiration. Decreased bibasilar breath sounds CARDIOVASCULAR: S1, S2 normal. No  rubs, or gallops. 2/6 systolic murmur present ABDOMEN: Soft, nontender, nondistended. Bowel sounds present. No organomegaly or mass.  EXTREMITIES: No pedal edema, cyanosis, or clubbing.  NEUROLOGIC: Cranial nerves II through XII are intact. Alert, nonverbal. Extreme weakness in lower extremities. Sensation intact. Gait not checked.  PSYCHIATRIC: The patient is alert, but remains nonverbal. SKIN: No obvious rash, lesion, or ulcer   DATA REVIEW:   CBC Recent Labs  Lab 09/16/18 0546  WBC 7.0  HGB 12.5  HCT 37.8  PLT 165    Chemistries  Recent Labs  Lab 09/14/18 1733 09/16/18 0546  NA 145 139  K 4.0 3.6  CL 108 108  CO2 27 23  GLUCOSE 200* 128*  BUN 28* 12  CREATININE 0.81 0.89  CALCIUM 9.0 8.3*  AST 27  --   ALT 29  --   ALKPHOS 108  --   BILITOT 0.3  --      Microbiology Results  Results for orders placed or performed during the hospital encounter of 09/14/18  Blood culture (routine x 2)     Status: None (Preliminary result)   Collection Time: 09/14/18  5:33 PM   Specimen: BLOOD  Result Value Ref Range Status   Specimen Description BLOOD LEFT ANTECUBITAL  Final   Special Requests   Final    BOTTLES DRAWN AEROBIC AND ANAEROBIC Blood Culture adequate volume   Culture   Final    NO GROWTH 2 DAYS Performed at [facility], [address] [place], [place] [address]    Report Status PENDING  Incomplete  Blood culture (routine x 2)     Status: None (Preliminary result)   Collection Time: 09/14/18  5:38 PM   Specimen: BLOOD  Result Value Ref Range Status   Specimen Description BLOOD RIGHT ANTECUBITAL  Final   Special Requests   Final    BOTTLES DRAWN AEROBIC AND ANAEROBIC Blood Culture adequate volume   Culture   Final    NO GROWTH 2 DAYS Performed at [facility], [address], [place], [place] [address]    Report Status PENDING  Incomplete  Urine Culture     Status: Abnormal   Collection Time: 09/14/18  7:08 PM   Specimen: Urine, Random  Result Value Ref Range Status   Specimen Description   Final    URINE, RANDOM Performed at [facility], [address] [place], [place] [address]    Special Requests   Final    NONE Performed at [facility], [address], [place], [place] [address]    Culture >=100,000 COLONIES/mL ESCHERICHIA COLI (A)  Final   Report Status 09/16/2018 FINAL  Final   Organism ID, Bacteria ESCHERICHIA COLI (A)   Final      Susceptibility   Escherichia coli - MIC*    AMPICILLIN <=2 SENSITIVE Sensitive     CEFAZOLIN <=4 SENSITIVE Sensitive     CEFTRIAXONE <=1 SENSITIVE Sensitive     CIPROFLOXACIN >=4 RESISTANT Resistant     GENTAMICIN <=1 SENSITIVE Sensitive     IMIPENEM <=0.25 SENSITIVE Sensitive     NITROFURANTOIN <=16 SENSITIVE Sensitive     TRIMETH/SULFA <=20 SENSITIVE Sensitive     AMPICILLIN/SULBACTAM <=2 SENSITIVE Sensitive     PIP/TAZO <=4 SENSITIVE Sensitive     Extended ESBL NEGATIVE Sensitive     * >=100,000 COLONIES/mL ESCHERICHIA COLI  SARS Coronavirus 2 (CEPHEID- Performed in [facility] hospital lab), Hosp Order     Status: None   Collection Time: 09/14/18  9:04 PM   Specimen: Nasopharyngeal Swab  Result Value Ref Range Status   SARS Coronavirus 2 NEGATIVE NEGATIVE Final    Comment: (NOTE) If result is NEGATIVE SARS-CoV-2 target nucleic acids are NOT DETECTED. The SARS-CoV-2 RNA is generally detectable in upper and lower  respiratory specimens during the acute phase of infection. The lowest  concentration of SARS-CoV-2 viral copies this assay can detect is 250  copies / mL. A negative result does not preclude SARS-CoV-2 infection  and should not be used as the sole basis for treatment or other  patient management decisions.  A negative result may occur with  improper specimen collection / handling, submission of specimen other  than nasopharyngeal swab, presence of viral mutation(s) within the  areas targeted by this assay, and inadequate number of viral copies  (<250 copies / mL). A negative result must be combined with clinical  observations, patient history, and epidemiological information. If result is POSITIVE SARS-CoV-2 target nucleic acids are DETECTED. The SARS-CoV-2 RNA is generally detectable in upper and lower  respiratory specimens dur ing the acute phase of infection.  Positive  results are indicative of active infection with SARS-CoV-2.  Clinical   correlation with patient history and other diagnostic information is  necessary to determine patient infection status.  Positive results do  not rule out bacterial infection or co-infection with other viruses. If result is PRESUMPTIVE POSTIVE SARS-CoV-2 nucleic acids MAY BE PRESENT.   A presumptive positive result was obtained on  the submitted specimen  and confirmed on repeat testing.  While 2019 novel coronavirus  (SARS-CoV-2) nucleic acids may be present in the submitted sample  additional confirmatory testing may be necessary for epidemiological  and / or clinical management purposes  to differentiate between  SARS-CoV-2 and other Sarbecovirus currently known to infect humans.  If clinically indicated additional testing with an alternate test  methodology [phone]([id number]) is advised. The SARS-CoV-2 RNA is generally  detectable in upper and lower respiratory sp ecimens during the acute  phase of infection. The expected result is Negative. Fact Sheet for Patients:  BoilerBrush.com.cyhttps://www.fda.gov/media/136312/download Fact Sheet for Healthcare Providers: https://pope.com/https://www.fda.gov/media/136313/download This test is not yet approved or cleared by the [place] FDA and has been authorized for detection and/or diagnosis of SARS-CoV-2 by FDA under an Emergency Use Authorization (EUA).  This EUA will remain in effect (meaning this test can be used) for the duration of the COVID-19 declaration under Section 564(b)(1) of the Act, 21 U.S.C. section 360bbb-3(b)(1), unless the authorization is terminated or revoked sooner. Performed at [facility], [address] [place], [place] [address]   MRSA PCR Screening     Status: None   Collection Time: 09/14/18 11:17 PM   Specimen: Nasal Mucosa; Nasopharyngeal  Result Value Ref Range Status   MRSA by PCR NEGATIVE NEGATIVE Final    Comment:        The GeneXpert MRSA Assay (FDA approved for NASAL specimens only), is one component of a comprehensive MRSA  colonization surveillance program. It is not intended to diagnose MRSA infection nor to guide or monitor treatment for MRSA infections. Performed at [facility], [address] [place], [place] [address]     RADIOLOGY:  No results found.   Management plans discussed with the patient, family and they are in agreement.  CODE STATUS:     Code Status Orders  (From admission, onward)         Start     Ordered   09/14/18 2315  Do not attempt resuscitation (DNR)  Continuous    Question Answer Comment  In the event of cardiac or respiratory ARREST Do not call a "code blue"   In the event of cardiac or respiratory ARREST Do not perform Intubation, CPR, defibrillation or ACLS   In the event of cardiac or respiratory ARREST Use medication by any route, position, wound care, and other measures to relive pain and suffering. May use oxygen, suction and manual treatment of airway obstruction as needed for comfort.      09/14/18 2314        Code Status History    Date Active Date Inactive Code Status Order ID Comments User Context   03/18/2015 1318 03/21/2015 1804 DNR [id number]  Kennedy BuckerMenz, Michael, MD Inpatient   03/18/2015 0011 03/18/2015 1318 DNR [id number]  Kennedy BuckerMenz, Michael, MD ED   03/17/2015 2236 03/18/2015 0011 DNR [id number]  Wyatt HasteHower, David K, MD ED   03/17/2015 2234 03/17/2015 2236 Full Code [id number]  Hower, Cletis Athensavid K, MD ED   Advance Care Planning Activity    Advance Directive Documentation     Most Recent Value  Type of Advance Directive  Out of facility DNR (pink MOST or yellow form)  Pre-existing out of facility DNR order (yellow form or pink MOST form)  Yellow form placed in chart (order not valid for inpatient use)  "MOST" Form in Place?  -      TOTAL TIME TAKING CARE OF THIS PATIENT: 38 minutes.    Donnita Fallsadhika  Rayon Mcchristian M.D on 09/16/2018 at 12:51 PM  Between 7am to 6pm - Pager - [phone]  After 6pm go to [email or web address] - Social research officer, governmentpassword [email or web address] [email or web address]  [employer]  [place] [facility]  Office  [phone]  CC: Primary care physician; Sherlene Shamsullo, Teresa L, MD   Note: This dictation was prepared with Dragon dictation along with smaller phrase technology. Any transcriptional errors that result from this process are unintentional.

## 2018-09-16 NOTE — TOC Progression Note (Signed)
Transition of Care [facility]) - Progression Note    Patient Details  Name: Catherine Meyers MRN: [id number] Date of Birth: 1927/11/20  Transition of Care [facility]) CM/SW Contact  Shelbie Hutching, RN Phone Number: 09/16/2018, 10:29 AM  Clinical Narrative:     Pasrr is still pending, department of mental health is reviewing Pasrr and will contact TOC team when completed.  Tina with [facility] will have a bed available today once the Pasrr comes back.   Expected Discharge Plan: [place] Barriers to Discharge: Continued Medical Work up  Expected Discharge Plan and Services Expected Discharge Plan: [place]   Discharge Planning Services: CM Consult   Living arrangements for the past 2 months: Assisted Living Facility                                       Social Determinants of Health (SDOH) Interventions    Readmission Risk Interventions No flowsheet data found.

## 2018-09-16 NOTE — Progress Notes (Signed)
[place] at [place] NAME: Catherine Meyers    MR#:  [id number]  DATE OF BIRTH:  April 09, 1927  SUBJECTIVE:  CHIEF COMPLAINT:   Chief Complaint  Patient presents with  . Altered Mental Status   - PT consulted, nonverbal at baseline, unable to go back to ALF- will need skilled nursing. - on ABX for UTI- change to oral today - gurgling noted yesterday- speech therapy consulted.  REVIEW OF SYSTEMS:  Review of Systems  Unable to perform ROS: Patient nonverbal    DRUG ALLERGIES:   Allergies  Allergen Reactions  . Ampicillin     Other reaction(s): Unknown  . Cefaclor Other (See Comments)    VITALS:  Blood pressure (!) 151/69, pulse 60, temperature 97.8 F (36.6 C), temperature source Oral, resp. rate 18, height 5\' 2"  (1.575 m), weight 64.9 kg, SpO2 93 %.  PHYSICAL EXAMINATION:  Physical Exam   GENERAL:  85 y.o.-year-old elderly patient lying in the bed with no acute distress.  EYES: Pupils equal, round, reactive to light and accommodation. No scleral icterus. Extraocular muscles intact.  HEENT: Head atraumatic, normocephalic. Oropharynx and nasopharynx clear.  NECK:  Supple, no jugular venous distention. No thyroid enlargement, no tenderness.  LUNGS: Normal breath sounds bilaterally, no wheezing, rales,rhonchi or crepitation. No use of accessory muscles of respiration. Decreased bibasilar breath sounds CARDIOVASCULAR: S1, S2 normal. No  rubs, or gallops. 2/6 systolic murmur present ABDOMEN: Soft, nontender, nondistended. Bowel sounds present. No organomegaly or mass.  EXTREMITIES: No pedal edema, cyanosis, or clubbing.  NEUROLOGIC: Cranial nerves II through XII are intact. Alert, nonverbal. Extreme weakness in lower extremities. Sensation intact. Gait not checked.  PSYCHIATRIC: The patient is alert, but remains nonverbal. SKIN: No obvious rash, lesion, or ulcer.    LABORATORY PANEL:   CBC Recent Labs  Lab 09/16/18 0546  WBC 7.0   HGB 12.5  HCT 37.8  PLT 165   ------------------------------------------------------------------------------------------------------------------  Chemistries  Recent Labs  Lab 09/14/18 1733 09/16/18 0546  NA 145 139  K 4.0 3.6  CL 108 108  CO2 27 23  GLUCOSE 200* 128*  BUN 28* 12  CREATININE 0.81 0.89  CALCIUM 9.0 8.3*  AST 27  --   ALT 29  --   ALKPHOS 108  --   BILITOT 0.3  --    ------------------------------------------------------------------------------------------------------------------  Cardiac Enzymes Recent Labs  Lab 09/14/18 1733  TROPONINI 0.04*   ------------------------------------------------------------------------------------------------------------------  RADIOLOGY:  No results found.  EKG:   Orders placed or performed during the hospital encounter of 09/14/18  . EKG 12-Lead  . EKG 12-Lead  . ED EKG 12-Lead  . ED EKG 12-Lead    ASSESSMENT AND PLAN:   Catherine Meyers  is a 85 y.o. female with a known history of advanced Alzheimer's dementia who is nonverbal at baseline and walks very little with a walker comes in from ALF due to altered mental status.  1. UTI/acute cystitis -admit for observation -IV fluids,  ABX changed to PO keflex based on sensitivities - cultures growing sensitive E.coli   2. Advanced dementia -patient is from ALF. She is nonverbal at baseline. She walks very minimal with walker. -Continue home meds- on remeron and trazodone - unable to return back due to needing higher level of care- will need SNF - social worker consulted.  Physical therapy consulted  3. Catherine Meyers continue PPI  4. DVT prophylaxis Lovenox  Discharge to [facility] today or tomorrow- PASSR number pending  All the records are reviewed and case discussed with Care Management/Social Workerr. Management plans discussed with the patient, family and they are in agreement.  CODE STATUS: DNR  TOTAL TIME TAKING CARE OF THIS PATIENT: 38 minutes.    POSSIBLE D/C IN 2 DAYS, DEPENDING ON CLINICAL CONDITION.   Catherine Meyers M.D on 09/16/2018 at 12:45 PM  Between 7am to 6pm - Pager - [phone]  After 6pm go to [email or web address] - Social research officer, governmentpassword [email or web address]  [facility] [place] [facility]  Office  [phone]  CC: Primary care physician; Catherine Meyers, Catherine L, MD

## 2018-09-16 NOTE — TOC Progression Note (Signed)
Transition of Care [facility]) - Progression Note    Patient Details  Name: Catherine Meyers MRN: [id number] Date of Birth: Aug 14, 1927  Transition of Care [facility]) CM/SW Contact  Shelbie Hutching, RN Phone Number: 09/16/2018, 3:47 PM  Clinical Narrative:    Pasrr # [id number] Kluver at [facility] can take patient tomorrow.  TOC team will call in the morning to arrange discharge from hospital.  Azzie Glatter notified of plan.    Expected Discharge Plan: [place] Barriers to Discharge: Continued Medical Work up  Expected Discharge Plan and Services Expected Discharge Plan: [place]   Discharge Planning Services: CM Consult   Living arrangements for the past 2 months: Assisted Living Facility                                       Social Determinants of Health (SDOH) Interventions    Readmission Risk Interventions No flowsheet data found.

## 2018-09-16 NOTE — Evaluation (Signed)
Clinical/Bedside Swallow Evaluation Patient Details  Name: Catherine Meyers MRN: [id number] Date of Birth: [birth year]  Today's Date: 09/16/2018 Time: SLP Start Time (ACUTE ONLY): 0915 SLP Stop Time (ACUTE ONLY): 1015 SLP Time Calculation (min) (ACUTE ONLY): 60 min  Past Medical History:  Past Medical History:  Diagnosis Date  . Arthritis    left knee  . Dementia ([facility])   . Dementia ([place])   . Hepatitis   . Hepatitis, water-borne 1965  . Migraine   . Osteopenia   . Vitamin D deficiency disease    Past Surgical History:  Past Surgical History:  Procedure Laterality Date  . ABDOMINAL HYSTERECTOMY  1986  . HIP PINNING,CANNULATED Right 03/18/2015   Procedure: CANNULATED HIP PINNING;  Surgeon: Hessie Knows, MD;  Location: [facility] ORS;  Service: Orthopedics;  Laterality: Right;  . KNEE ARTHROSCOPY  2003   HPI:  Pt is a 85 y.o. female with a known history of advanced Alzheimer's dementia who is nonverbal at baseline and walks very little with a walker comes in from her facility due to potential UTI.  Pt is nonverbal per chart note.    Assessment / Plan / Recommendation Clinical Impression  Pt appears to present w/ oral phase dysphagia c/b oral holding of bolus consistencies - this can increased risk for pharyngeal phase deficits thus aspiration and Pulmonary decline. Pt had severely, advance Dementia per MD notes. Pt required assistance w/ feeding of po trials of ice chips, thin liquids via Cup, and purees. Oral phase c/b decreased awareness of boluses, prolonged oral transit and oral holding. W/ the visual cue/presentation of the next bolus, pt swallowed and orally cleared in preparation for it. Adequate oral clearing noted b/t trials. Pt exhibited a mild throat clearing x1 during trials of thin liquids via Cup - suspect d/t the oral holding of the bolus trial. This will need to be monitored for need to downgrade to Nectar consistency liquids. Pt helped to feed self but needed Mod-Max cues. No  unilateral weakness noted during OM movements of bolus management. Recommend a Dysphagia levlel 1 (puree) diet w/ Thin liquids; aspiration precautions; Supervision at all meals for support and monitoring. Recommend Pills in Puree - Crushed. NSG to reconsult ST services if any further needs indicated while admitted. Pt appears at her baseline w/ regard to her swallowing abilities in light of advance Cognitive decline/Dementia.  SLP Visit Diagnosis: Dysphagia, oropharyngeal phase (R13.12)(baseline Dementia)    Aspiration Risk  Mild aspiration risk(reduced when following precautions)    Diet Recommendation  Dysphagia level 1 (PUREE) w/ thin liquids; aspiration precautions; feeding support and Supervision at meals d/t Cognitive decline  Medication Administration: Crushed with puree    Other  Recommendations Recommended Consults: (Dietician f/u; Palliative Care consult for family/pt) Oral Care Recommendations: Oral care BID;Oral care before and after PO;Staff/trained caregiver to provide oral care Other Recommendations: (n/a at this time)   Follow up Recommendations Skilled Nursing facility(TBD for further needs(education of staff, family))      Frequency and Duration (n/a)  (n/a)       Prognosis Prognosis for Safe Diet Advancement: Guarded(-Fair) Barriers to Reach Goals: Cognitive deficits;Time post onset;Severity of deficits;Language deficits      Swallow Study   General Date of Onset: 09/14/18 HPI: Pt is a 85 y.o. female with a known history of advanced Alzheimer's dementia who is nonverbal at baseline and walks very little with a walker comes in from her facility due to potential UTI.  Pt is nonverbal per chart note.  Type of Study: Bedside Swallow Evaluation Previous Swallow Assessment: none indicated Diet Prior to this Study: Dysphagia 2 (chopped);Dysphagia 1 (puree);Thin liquids(NSG had modified diet already) Temperature Spikes Noted: No(wbc 7.0) Respiratory Status: Room  air History of Recent Intubation: No Behavior/Cognition: Alert;Cooperative;Pleasant mood;Confused;Distractible;Requires cueing;Doesn't follow directions(Nonverbal; required full cues) Oral Cavity Assessment: Within Functional Limits Oral Care Completed by SLP: Recent completion by staff Oral Cavity - Dentition: Missing dentition Vision: Functional for self-feeding Self-Feeding Abilities: Able to feed self;Total assist;Needs assist Patient Positioning: Upright in bed(needed positioning) Baseline Vocal Quality: Low vocal intensity(during phonations) Volitional Cough: Cognitively unable to elicit Volitional Swallow: Unable to elicit    Oral/Motor/Sensory Function Overall Oral Motor/Sensory Function: (appeared WFL during bolus management, clearing)   Ice Chips Ice chips: Within functional limits Presentation: Spoon(fed; 3 trials)   Thin Liquid Thin Liquid: Impaired Presentation: Cup;Self Fed(8 trials) Oral Phase Impairments: Poor awareness of bolus Oral Phase Functional Implications: Oral holding;Prolonged oral transit Pharyngeal  Phase Impairments: Throat Clearing - Delayed(x1/8) Other Comments: noted bolus piecemealing also    Nectar Thick Nectar Thick Liquid: Not tested   Honey Thick Honey Thick Liquid: Not tested   Puree Puree: Impaired Presentation: Spoon(fed; 6 trials) Oral Phase Impairments: Poor awareness of bolus Oral Phase Functional Implications: Prolonged oral transit;Oral holding Pharyngeal Phase Impairments: (none)   Solid     Solid: Not tested Other Comments: d/t Cognitive decline and oral holding w/ puree, liquids       Jerilynn Som , MS, CCC-SLP , 09/16/2018,3:09 PM

## 2018-09-16 NOTE — TOC Progression Note (Signed)
Transition of Care [facility]) - Progression Note    Patient Details  Name: Catherine Meyers MRN: [id number] Date of Birth: [birth year]  Transition of Care [facility]) CM/SW Contact  Shelbie Hutching, RN Phone Number: 09/16/2018, 9:17 AM  Clinical Narrative:    [facility] bed.  [facility] has private pay single room for $270/day.  Patient's son reports that he will be able to private pay until the patient's savings run out and then he may need help applying for Medicaid.  Legrand Como, patient's son has agreed to bed at [facility] and Otila Kluver with [facility] will reach out and speak with him about the financials.  Physical therapy has recommended SNF for rehab.  Medicare should initially pay for rehab days.  RNCM will speak with Otila Kluver from [facility] after she talks with the son to arrange hospital discharge.    Expected Discharge Plan: [place] Barriers to Discharge: Continued Medical Work up  Expected Discharge Plan and Services Expected Discharge Plan: [place]   Discharge Planning Services: CM Consult   Living arrangements for the past 2 months: Assisted Living Facility                                       Social Determinants of Health (SDOH) Interventions    Readmission Risk Interventions No flowsheet data found.

## 2018-09-17 NOTE — TOC Transition Note (Signed)
Transition of Care [facility]) - CM/SW Discharge Note   Patient Details  Name: ERNESTINA JOE MRN: [id number] Date of Birth: 02-04-1928  Transition of Care [facility]) CM/SW Contact:  Shelbie Hutching, RN Phone Number: 09/17/2018, 9:13 AM   Clinical Narrative:    [facility] has a bed at [facility], she will be going to room 702.  Bedside RN will call report to the main number at [phone].  Patient will transport by EMS transport.  Son Legrand Como is aware of patient's discharge today.   Final next level of care: Skilled Nursing Facility Barriers to Discharge: Barriers Resolved   Patient Goals and CMS Choice Patient states their goals for this hospitalization and ongoing recovery are:: Patient is unable to state goals but son needs help with nursing home placement, unable to return to assisted living CMS Medicare.gov Compare Post Acute Care list provided to:: Patient Represenative (must comment) Choice offered to / list presented to : Adult Children  Discharge Placement              Patient chooses bed at: [facility] [place] Patient to be transferred to facility by: Home EMS Name of family member notified: Khala Tarte Patient and family notified of of transfer: 09/17/18  Discharge Plan and Services   Discharge Planning Services: CM Consult                                 Social Determinants of Health (SDOH) Interventions     Readmission Risk Interventions No flowsheet data found.

## 2018-09-17 NOTE — Progress Notes (Signed)
Patient discharged to [facility] per MD order. Report called to [place] at facility. EMS called for transport.

## 2018-09-17 NOTE — Discharge Summary (Signed)
[facility] [place] at [facility]   PATIENT NAME: Catherine Meyers    MR#:  [id number]  DATE OF BIRTH:  Mar 27, 1928  DATE OF ADMISSION:  09/14/2018   ADMITTING PHYSICIAN: Enedina FinnerSona Patel, MD  DATE OF DISCHARGE:  09/17/18  PRIMARY CARE PHYSICIAN: Sherlene Shamsullo, Teresa L, MD   ADMISSION DIAGNOSIS:   Lower urinary tract infectious disease [N39.0] Weakness [R53.1]  DISCHARGE DIAGNOSIS:   Active Problems:   UTI (urinary tract infection)   SECONDARY DIAGNOSIS:   Past Medical History:  Diagnosis Date  . Arthritis    left knee  . Dementia ([facility])   . Dementia ([facility])   . Hepatitis   . Hepatitis, water-borne 1965  . Migraine   . Osteopenia   . Vitamin D deficiency disease     HOSPITAL COURSE:   Catherine Meyers a85 y.o.femalewith a known history of advanced Alzheimer's dementia who is nonverbal at baseline and walks very little with a walker comes in from ALF due to altered mental status.  1.UTI/acute cystitis -IV fluids,  ABX changed to PO keflex based on sensitivities - cultures growing sensitive E.coli   2.Advanced dementia -patient is from ALF. She is nonverbal at baseline. She walks very minimal with walker. -Continue home meds- on remeron and trazodone - unable to return back due to needing higher level of care- will need SNF - social worker consulted.  - will need palliative care after discharge  3.Gerd continue PPI   Discharge to [facility] today   DISCHARGE CONDITIONS:   Guarded  CONSULTS OBTAINED:   None  DRUG ALLERGIES:   Allergies  Allergen Reactions  . Ampicillin     Other reaction(s): Unknown  . Cefaclor Other (See Comments)   DISCHARGE MEDICATIONS:   Allergies as of 09/17/2018      Reactions   Ampicillin    Other reaction(s): Unknown   Cefaclor Other (See Comments)      Medication List    TAKE these medications   acetaminophen 325 MG tablet Commonly known as: TYLENOL Take 325 mg by mouth 4 (four) times daily.    cephALEXin 500 MG capsule Commonly known as: KEFLEX Take 1 capsule (500 mg total) by mouth every 12 (twelve) hours for 5 days.   Cranberry 450 MG Tabs TAKE ONE TABLET BY MOUTH EVERY DAY   loperamide 2 MG capsule Commonly known as: IMODIUM TAKE PER STANDING AS NEEDED ORDERS What changed: See the new instructions.   loratadine 10 MG tablet Commonly known as: CLARITIN TAKE ONE TABLET BY MOUTH EVERY DAY   mirtazapine 15 MG tablet Commonly known as: REMERON TAKE ONE TABLET BY MOUTH AT BEDTIME   omeprazole 40 MG capsule Commonly known as: PRILOSEC TAKE ONE CAPSULE BY MOUTH EVERY DAY *DO NOT CRUSH* What changed: See the new instructions.   traZODone 50 MG tablet Commonly known as: DESYREL TAKE ONE TABLET BY MOUTH AT BEDTIME   Vitamin D (Ergocalciferol) 1.25 MG (50000 UT) Caps capsule Commonly known as: DRISDOL TAKE ONE CAPSULE BY MOUTH EVERY 14 DAY What changed: See the new instructions.        DISCHARGE INSTRUCTIONS:   1. PCP f/u in 1-2 weeks 2. Palliative care follow up  DIET:   Dysphagia 1 with thin liquids - Extra Gravy on meats, potatoes; yogurt and puddings and cream soups. NO Straws. - aspiration precautions; Pills in Puree - Crushed as able. Feeding support and Supervision at meals d/t Cognitive decline.   ACTIVITY:   Activity as tolerated  OXYGEN:   Home  Oxygen: No.  Oxygen Delivery: room air  DISCHARGE LOCATION:   nursing home   If you experience worsening of your admission symptoms, develop shortness of breath, life threatening emergency, suicidal or homicidal thoughts you must seek medical attention immediately by calling 911 or calling your MD immediately  if symptoms less severe.  You Must read complete instructions/literature along with all the possible adverse reactions/side effects for all the Medicines you take and that have been prescribed to you. Take any new Medicines after you have completely understood and accpet all the possible  adverse reactions/side effects.   Please note  You were cared for by a hospitalist during your hospital stay. If you have any questions about your discharge medications or the care you received while you were in the hospital after you are discharged, you can call the unit and asked to speak with the hospitalist on call if the hospitalist that took care of you is not available. Once you are discharged, your primary care physician will handle any further medical issues. Please note that NO REFILLS for any discharge medications will be authorized once you are discharged, as it is imperative that you return to your primary care physician (or establish a relationship with a primary care physician if you do not have one) for your aftercare needs so that they can reassess your need for medications and monitor your lab values.    On the day of Discharge:  VITAL SIGNS:   Blood pressure 104/70, pulse 82, temperature 98.1 F (36.7 C), temperature source Axillary, resp. rate 16, height 5\' 2"  (1.575 m), weight 64.9 kg, SpO2 94 %.  PHYSICAL EXAMINATION:   GENERAL:  85 y.o.-year-old elderly patient lying in the bed with no acute distress.  EYES: Pupils equal, round, reactive to light and accommodation. No scleral icterus. Extraocular muscles intact.  HEENT: Head atraumatic, normocephalic. Oropharynx and nasopharynx clear.  NECK:  Supple, no jugular venous distention. No thyroid enlargement, no tenderness.  LUNGS: Normal breath sounds bilaterally, no wheezing, rales,rhonchi or crepitation. No use of accessory muscles of respiration. Decreased bibasilar breath sounds CARDIOVASCULAR: S1, S2 normal. No  rubs, or gallops. 2/6 systolic murmur present ABDOMEN: Soft, nontender, nondistended. Bowel sounds present. No organomegaly or mass.  EXTREMITIES: No pedal edema, cyanosis, or clubbing.  NEUROLOGIC: Cranial nerves II through XII are intact. Alert, nonverbal. Extreme weakness in lower extremities. Sensation  intact. Gait not checked.  PSYCHIATRIC: The patient is alert, but remains nonverbal. SKIN: No obvious rash, lesion, or ulcer  DATA REVIEW:   CBC Recent Labs  Lab 09/16/18 0546  WBC 7.0  HGB 12.5  HCT 37.8  PLT 165    Chemistries  Recent Labs  Lab 09/14/18 1733 09/16/18 0546  NA 145 139  K 4.0 3.6  CL 108 108  CO2 27 23  GLUCOSE 200* 128*  BUN 28* 12  CREATININE 0.81 0.89  CALCIUM 9.0 8.3*  AST 27  --   ALT 29  --   ALKPHOS 108  --   BILITOT 0.3  --      Microbiology Results  Results for orders placed or performed during the hospital encounter of 09/14/18  Blood culture (routine x 2)     Status: None (Preliminary result)   Collection Time: 09/14/18  5:33 PM   Specimen: BLOOD  Result Value Ref Range Status   Specimen Description BLOOD LEFT ANTECUBITAL  Final   Special Requests   Final    BOTTLES DRAWN AEROBIC AND ANAEROBIC Blood Culture adequate volume  Culture   Final    NO GROWTH 3 DAYS Performed at [facility], [address] [place], [place] [address]    Report Status PENDING  Incomplete  Blood culture (routine x 2)     Status: None (Preliminary result)   Collection Time: 09/14/18  5:38 PM   Specimen: BLOOD  Result Value Ref Range Status   Specimen Description BLOOD RIGHT ANTECUBITAL  Final   Special Requests   Final    BOTTLES DRAWN AEROBIC AND ANAEROBIC Blood Culture adequate volume   Culture   Final    NO GROWTH 3 DAYS Performed at [facility], [address] [place], [place] [address]    Report Status PENDING  Incomplete  Urine Culture     Status: Abnormal   Collection Time: 09/14/18  7:08 PM   Specimen: Urine, Random  Result Value Ref Range Status   Specimen Description   Final    URINE, RANDOM Performed at [facility], [address] [place], [place] [address]    Special Requests   Final    NONE Performed at [facility], [address] [place], [place] [address]    Culture >=100,000  COLONIES/mL ESCHERICHIA COLI (A)  Final   Report Status 09/16/2018 FINAL  Final   Organism ID, Bacteria ESCHERICHIA COLI (A)  Final      Susceptibility   Escherichia coli - MIC*    AMPICILLIN <=2 SENSITIVE Sensitive     CEFAZOLIN <=4 SENSITIVE Sensitive     CEFTRIAXONE <=1 SENSITIVE Sensitive     CIPROFLOXACIN >=4 RESISTANT Resistant     GENTAMICIN <=1 SENSITIVE Sensitive     IMIPENEM <=0.25 SENSITIVE Sensitive     NITROFURANTOIN <=16 SENSITIVE Sensitive     TRIMETH/SULFA <=20 SENSITIVE Sensitive     AMPICILLIN/SULBACTAM <=2 SENSITIVE Sensitive     PIP/TAZO <=4 SENSITIVE Sensitive     Extended ESBL NEGATIVE Sensitive     * >=100,000 COLONIES/mL ESCHERICHIA COLI  SARS Coronavirus 2 (CEPHEID- Performed in [facility] hospital lab), Hosp Order     Status: None   Collection Time: 09/14/18  9:04 PM   Specimen: Nasopharyngeal Swab  Result Value Ref Range Status   SARS Coronavirus 2 NEGATIVE NEGATIVE Final    Comment: (NOTE) If result is NEGATIVE SARS-CoV-2 target nucleic acids are NOT DETECTED. The SARS-CoV-2 RNA is generally detectable in upper and lower  respiratory specimens during the acute phase of infection. The lowest  concentration of SARS-CoV-2 viral copies this assay can detect is 250  copies / mL. A negative result does not preclude SARS-CoV-2 infection  and should not be used as the sole basis for treatment or other  patient management decisions.  A negative result may occur with  improper specimen collection / handling, submission of specimen other  than nasopharyngeal swab, presence of viral mutation(s) within the  areas targeted by this assay, and inadequate number of viral copies  (<250 copies / mL). A negative result must be combined with clinical  observations, patient history, and epidemiological information. If result is POSITIVE SARS-CoV-2 target nucleic acids are DETECTED. The SARS-CoV-2 RNA is generally detectable in upper and lower  respiratory specimens dur  ing the acute phase of infection.  Positive  results are indicative of active infection with SARS-CoV-2.  Clinical  correlation with patient history and other diagnostic information is  necessary to determine patient infection status.  Positive results do  not rule out bacterial infection or co-infection with other viruses. If result  is PRESUMPTIVE POSTIVE SARS-CoV-2 nucleic acids MAY BE PRESENT.   A presumptive positive result was obtained on the submitted specimen  and confirmed on repeat testing.  While 2019 novel coronavirus  (SARS-CoV-2) nucleic acids may be present in the submitted sample  additional confirmatory testing may be necessary for epidemiological  and / or clinical management purposes  to differentiate between  SARS-CoV-2 and other Sarbecovirus currently known to infect humans.  If clinically indicated additional testing with an alternate test  methodology [phone]) is advised. The SARS-CoV-2 RNA is generally  detectable in upper and lower respiratory sp ecimens during the acute  phase of infection. The expected result is Negative. Fact Sheet for Patients:  BoilerBrush.com.cy Fact Sheet for Healthcare Providers: https://pope.com/ This test is not yet approved or cleared by the [place] FDA and has been authorized for detection and/or diagnosis of SARS-CoV-2 by FDA under an Emergency Use Authorization (EUA).  This EUA will remain in effect (meaning this test can be used) for the duration of the COVID-19 declaration under Section 564(b)(1) of the Act, 21 U.S.C. section 360bbb-3(b)(1), unless the authorization is terminated or revoked sooner. Performed at [facility], [address] [place], [place] [address]   MRSA PCR Screening     Status: None   Collection Time: 09/14/18 11:17 PM   Specimen: Nasal Mucosa; Nasopharyngeal  Result Value Ref Range Status   MRSA by PCR NEGATIVE NEGATIVE Final    Comment:         The GeneXpert MRSA Assay (FDA approved for NASAL specimens only), is one component of a comprehensive MRSA colonization surveillance program. It is not intended to diagnose MRSA infection nor to guide or monitor treatment for MRSA infections. Performed at [facility], [address], [place], [place] [address]     RADIOLOGY:  No results found.   Management plans discussed with the patient, family and they are in agreement.  CODE STATUS:     Code Status Orders  (From admission, onward)         Start     Ordered   09/14/18 2315  Do not attempt resuscitation (DNR)  Continuous    Question Answer Comment  In the event of cardiac or respiratory ARREST Do not call a "code blue"   In the event of cardiac or respiratory ARREST Do not perform Intubation, CPR, defibrillation or ACLS   In the event of cardiac or respiratory ARREST Use medication by any route, position, wound care, and other measures to relive pain and suffering. May use oxygen, suction and manual treatment of airway obstruction as needed for comfort.      09/14/18 2314        Code Status History    Date Active Date Inactive Code Status Order ID Comments User Context   03/18/2015 1318 03/21/2015 1804 DNR [id number]  Kennedy Bucker, MD Inpatient   03/18/2015 0011 03/18/2015 1318 DNR [id number]  Kennedy Bucker, MD ED   03/17/2015 2236 03/18/2015 0011 DNR [id number]  Wyatt Haste, MD ED   03/17/2015 2234 03/17/2015 2236 Full Code [id number]  Hower, Cletis [place], MD ED   Advance Care Planning Activity    Advance Directive Documentation     Most Recent Value  Type of Advance Directive  Out of facility DNR (pink MOST or yellow form)  Pre-existing out of facility DNR order (yellow form or pink MOST form)  Yellow form placed in chart (order not valid for inpatient use)  "MOST" Form in Place?  -  TOTAL TIME TAKING CARE OF THIS PATIENT: 38 minutes.    Enid Baasadhika Eisa Necaise M.D on 09/17/2018 at 9:14 AM   Between 7am to 6pm - Pager - [phone]  After 6pm go to [email or web address] - Social research officer, governmentpassword [email or web address] [facility]  [facility] [place] [facility]  Office  [phone]  CC: Primary care physician; Sherlene Shamsullo, Teresa L, MD   Note: This dictation was prepared with Dragon dictation along with smaller phrase technology. Any transcriptional errors that result from this process are unintentional.

## 2018-09-17 NOTE — Plan of Care (Signed)
  Problem: Clinical Measurements: Goal: Ability to maintain clinical measurements within normal limits will improve Outcome: Progressing   Problem: Clinical Measurements: Goal: Will remain free from infection Outcome: Progressing   Problem: Clinical Measurements: Goal: Diagnostic test results will improve Outcome: Progressing   Problem: Pain Managment: Goal: General experience of comfort will improve Outcome: Progressing   Problem: Safety: Goal: Ability to remain free from injury will improve Outcome: Progressing   Problem: Skin Integrity: Goal: Risk for impaired skin integrity will decrease Outcome: Progressing

## 2018-09-19 LAB — CULTURE, BLOOD (ROUTINE X 2)
Culture: NO GROWTH
Culture: NO GROWTH
Special Requests: ADEQUATE
Special Requests: ADEQUATE

## 2018-09-23 ENCOUNTER — Ambulatory Visit: Payer: Medicare Other | Admitting: Internal Medicine

## 2018-10-06 ENCOUNTER — Other Ambulatory Visit: Payer: Self-pay

## 2018-10-06 ENCOUNTER — Non-Acute Institutional Stay: Payer: Medicare Other | Admitting: Primary Care

## 2018-10-06 DIAGNOSIS — Z515 Encounter for palliative care: Secondary | ICD-10-CM

## 2018-10-06 NOTE — Progress Notes (Signed)
[facility], [facility] Consult Note Telephone: [phone]  Fax: [phone]  TELEHEALTH VISIT STATEMENT Due to the COVID-19 crisis, this visit was done via telemedicine from my office. It was initiated and consented to by this patient and/or family.  PATIENT NAME: Catherine Meyers DOB: 22-Mar-1928 MRN: [id number]  PRIMARY CARE PROVIDER:   Dorothey BasemanBronstein, David, MD [phone]  REFERRING PROVIDER: Dorothey BasemanBronstein, David, MD [address] [place] [address] [phone]  RESPONSIBLE PARTY:   Extended Emergency Contact Information Primary Emergency Contact: Catherine Meyers,Catherine Meyers: [address]          [place], [place] [address] [place] Home Phone: [phone] Mobile Phone: [phone] Relation: Son Secondary Emergency Contact: Abel PrestoBright,Catherine          [place], [place] [address] [place] Home Phone: [phone] Mobile Phone: [phone] Relation: Relative  Palliative Care was asked to follow this patient by consultation request of Catherine Meyers, Catherine L, MD. This is the initial visit.  ASSESSMENT AND RECOMMENDATIONS:   1. Goals of Care: Maximize quality of life and symptom management.  2. Symptom Management:    Pain: Does not seem to exhibit pain, can use facility protocol for prn  Constipation: Has regular bms on current protocol, continue current bowel program.  3. Family /Caregiver/Community Supports: Now cared for at [facility] where she was living. Presented with AMS and UTI. Now needs higher level of care. Will reach out to POA to discuss any concerns or questions.  4. Cognitive / Functional decline:  Advanced dementia, totally dependent in all adls and iadls. Has had frequent uti rx with antibiotics.  5. Advanced Care Directive: Has MOST on file, states DNR, DNH, no IV, no feeding tube and no antibiotics. [facility] Recommend to clarify with POA since pt is receiving antibiotics presently.  6. Follow up  Palliative Care Visit: Palliative care will continue to follow for goals of care clarification and symptom management. Return 4 weeks or prn.  I spent 25 minutes providing this consultation,  from 1400 to 1425. More than 50% of the time in this consultation was spent coordinating communication.   HISTORY OF PRESENT ILLNESS:  Catherine Meyers is a 85 y.o. year old female with multiple medical problems including arthritis, dementia, hepatitis, osteopenia. Palliative Care was asked to help Meyers goals of care.   CODE STATUS: Has MOST on file, states DNR, DNH, no IV, no feeding tube and no antibiotics  PPS: 30% HOSPICE ELIGIBILITY/DIAGNOSIS: TBD  PAST MEDICAL HISTORY:  Past Medical History:  Diagnosis Date  . Arthritis    left knee  . Dementia (HCC)   . Dementia (HCC)   . Hepatitis   . Hepatitis, water-borne 1965  . Migraine   . Osteopenia   . Vitamin D deficiency disease     SOCIAL HX:  Social History   Tobacco Use  . Smoking status: Never Smoker  . Smokeless tobacco: Never Used  Substance Use Topics  . Alcohol use: No    ALLERGIES:  Allergies  Allergen Reactions  . Ampicillin     Other reaction(s): Unknown  . Cefaclor Other (See Comments)     PERTINENT MEDICATIONS:  Outpatient Encounter Medications as of 10/06/2018  Medication Sig  . acetaminophen (TYLENOL) 325 MG tablet Take 325 mg by mouth 4 (four) times daily.  . Cranberry 450 MG TABS TAKE ONE TABLET BY MOUTH EVERY DAY (Patient taking differently: Take 450 mg by mouth daily. )  . loperamide (  IMODIUM) 2 MG capsule TAKE PER STANDING AS NEEDED ORDERS (Patient taking differently: Take 2 mg by mouth daily as needed for diarrhea or loose stools. )  . loratadine (CLARITIN) 10 MG tablet TAKE ONE TABLET BY MOUTH EVERY DAY (Patient taking differently: Take 10 mg by mouth daily. )  . mirtazapine (REMERON) 15 MG tablet TAKE ONE TABLET BY MOUTH AT BEDTIME (Patient taking differently: Take 15 mg by mouth at bedtime. )  . omeprazole  (PRILOSEC) 40 MG capsule TAKE ONE CAPSULE BY MOUTH EVERY DAY *DO NOT CRUSH* (Patient taking differently: Take 40 mg by mouth daily. )  . traZODone (DESYREL) 50 MG tablet TAKE ONE TABLET BY MOUTH AT BEDTIME (Patient taking differently: Take 50 mg by mouth at bedtime. )  . Vitamin D, Ergocalciferol, (DRISDOL) 50000 units CAPS capsule TAKE ONE CAPSULE BY MOUTH EVERY 14 DAY (Patient taking differently: Take 50,000 Units by mouth every 14 (fourteen) days. )   No facility-administered encounter medications on file as of 10/06/2018.     PHYSICAL EXAM/ROS:    97.6 .0-70-16 BP 129/77 Current and d past weights: 124 lb Wt recorded as 140 lb in March 2019. General: NAD, frail appearing, thin Cardiovascular: no chest pain reported, no edema,  Pulmonary: no cough, no increased SOB, 96% RA Abdomen: appetite good, 100%, denies constipation, large bm today,  incontinent of bowel GU: S/x dysuria, incontinent of urine, urine culture repeated and begun on bactrim x 5 days,, had previous culture with sensitivity to beta lactams and had been on Keflex prior. MSK:  Non ambulatory, oob in chair, lift assist Skin: no rashes or wounds reported Neurological: Weakness,  Verbal with one word answers, Extremities appear rigid, FAST score 7 a-b, staff denies agitation, Taking mirtazipine 15 mg at hs.  Cyndia Skeeters DNP, AGPCNP-BC

## 2018-10-27 ENCOUNTER — Telehealth: Payer: Self-pay | Admitting: Primary Care

## 2018-10-27 NOTE — Telephone Encounter (Signed)
T/c to nursing home. I was informed that patient expired yesterday.

## 2018-10-30 DEATH — deceased
